# Patient Record
Sex: Female | Born: 1935 | Race: White | Hispanic: No | Marital: Married | Smoking: Never smoker
Health system: Southern US, Community
[De-identification: ages and names within clinical notes are randomized; demographics above are authoritative.]

## PROBLEM LIST (undated history)

## (undated) DIAGNOSIS — Z9109 Other allergy status, other than to drugs and biological substances: Secondary | ICD-10-CM

## (undated) DIAGNOSIS — R112 Nausea with vomiting, unspecified: Secondary | ICD-10-CM

## (undated) DIAGNOSIS — I10 Essential (primary) hypertension: Secondary | ICD-10-CM

## (undated) DIAGNOSIS — M1990 Unspecified osteoarthritis, unspecified site: Secondary | ICD-10-CM

## (undated) DIAGNOSIS — J449 Chronic obstructive pulmonary disease, unspecified: Secondary | ICD-10-CM

## (undated) DIAGNOSIS — M8580 Other specified disorders of bone density and structure, unspecified site: Secondary | ICD-10-CM

## (undated) DIAGNOSIS — R569 Unspecified convulsions: Secondary | ICD-10-CM

## (undated) DIAGNOSIS — M549 Dorsalgia, unspecified: Secondary | ICD-10-CM

## (undated) DIAGNOSIS — Z5189 Encounter for other specified aftercare: Secondary | ICD-10-CM

## (undated) DIAGNOSIS — IMO0001 Reserved for inherently not codable concepts without codable children: Secondary | ICD-10-CM

## (undated) DIAGNOSIS — E079 Disorder of thyroid, unspecified: Secondary | ICD-10-CM

## (undated) DIAGNOSIS — E039 Hypothyroidism, unspecified: Secondary | ICD-10-CM

## (undated) DIAGNOSIS — Z9889 Other specified postprocedural states: Secondary | ICD-10-CM

## (undated) DIAGNOSIS — H3530 Unspecified macular degeneration: Secondary | ICD-10-CM

## (undated) DIAGNOSIS — F0390 Unspecified dementia without behavioral disturbance: Secondary | ICD-10-CM

## (undated) DIAGNOSIS — N390 Urinary tract infection, site not specified: Secondary | ICD-10-CM

## (undated) DIAGNOSIS — E7800 Pure hypercholesterolemia, unspecified: Secondary | ICD-10-CM

## (undated) HISTORY — PX: ROTATOR CUFF REPAIR: SHX139

## (undated) HISTORY — PX: KYPHOSIS SURGERY: SHX114

## (undated) HISTORY — PX: SPINAL FUSION: SHX223

## (undated) HISTORY — PX: BLADDER SURGERY: SHX569

---

## 2006-10-18 ENCOUNTER — Inpatient Hospital Stay (HOSPITAL_COMMUNITY): Admission: EM | Admit: 2006-10-18 | Discharge: 2006-10-23 | Payer: Self-pay | Admitting: Emergency Medicine

## 2006-10-18 ENCOUNTER — Ambulatory Visit: Payer: Self-pay | Admitting: *Deleted

## 2006-11-08 ENCOUNTER — Encounter: Admission: RE | Admit: 2006-11-08 | Discharge: 2006-11-08 | Payer: Self-pay | Admitting: Radiology

## 2006-11-12 ENCOUNTER — Encounter: Admission: RE | Admit: 2006-11-12 | Discharge: 2006-11-12 | Payer: Self-pay | Admitting: Neurosurgery

## 2007-03-31 ENCOUNTER — Encounter: Admission: RE | Admit: 2007-03-31 | Discharge: 2007-03-31 | Payer: Self-pay | Admitting: Radiology

## 2007-04-03 ENCOUNTER — Encounter: Admission: RE | Admit: 2007-04-03 | Discharge: 2007-04-03 | Payer: Self-pay | Admitting: Radiology

## 2007-04-10 ENCOUNTER — Inpatient Hospital Stay (HOSPITAL_COMMUNITY): Admission: RE | Admit: 2007-04-10 | Discharge: 2007-04-24 | Payer: Self-pay | Admitting: Neurological Surgery

## 2010-12-24 ENCOUNTER — Encounter: Payer: Self-pay | Admitting: Radiology

## 2011-04-17 NOTE — Discharge Summary (Signed)
NAMEFERN, Shirley Bond             ACCOUNT NO.:  [id number]   MEDICAL RECORD NO.:  [id number]          PATIENT TYPE:  INP   LOCATION:  3013                         FACILITY:  [facility]   PHYSICIAN:  Stefani Dama, M.D.  DATE OF BIRTH:  08-02-1924   DATE OF ADMISSION:  04/10/2007  DATE OF DISCHARGE:  04/23/2007                               DISCHARGE SUMMARY   ADMITTING DIAGNOSES:  1. Lumbar osteoporotic compression fracture of T12 and L1, with spinal      cord compromise, status post acrylic arthroplasty.  2. Chronic intractable back pain, with opioid dependence.  3. Urinary tract infection.   DIAGNOSES AT TIME OF DISCHARGE:  1. Lumbar osteoporotic compression fracture of T12 and L1, with spinal      cord compromise, status post acrylic arthroplasty.  2. Chronic intractable back pain, with opioid dependence.  3. Urinary tract infection.   FINAL DIAGNOSES:  1. Thoracolumbar compression fractures T12 and L1, with spinal cord      compromise, and chronic intractable pain.  2. Opioid dependence.  3. Urinary tract infection.  4. Altered mental status secondary to above.   CONDITION ON DISCHARGE:  Improving.   HOSPITAL COURSE:  [name] is an 77 year old individual who has  had significant problems with back pain, evidence of a compression  fracture.  She was treated with acrylic balloon kyphoplasty and received  little relief of her pain.  She has had continued difficulties and was  found to have progressive compression with retropulsion of bone into the  canal causing a severe stenosis at the T12-L1 level.  She was advised  regarding surgical decompression which was performed on the day of  admission, Apr 10, 2007.  She had substantial problems with pain  management postoperatively, complaining of severe intractable back pain,  along with altered level of consciousness and altered mental status due  to a significant number of opioids that the patient had become  acclimated  to.  To help with pain management, I obtained a palliative  medicine consult, which adjusted her dose of methadone and opioid  medications.  Gradually with time, her sensorium has cleared, and the  patient clinically is improving steadily.  She has good motor function  in her lower extremities.  The patient is noted to have a urinary tract  infection.  It was originally felt that she is colonized with  Escherichia coli.  Also, on a culture Enterococcus has grown out and a  second species.  Both are sensitive to nitrofurantoin.  At the time of  discharge, the patient is started on nitrofurantoin as Macrobid 100 mg  b.i.d.   ADDITIONAL MEDICATIONS AT THE TIME OF DISCHARGE:  1. Thiamine supplement 100 mg daily.  2. Multivitamin daily.  3. Lisinopril.  4. Zestril 20 mg daily.  5. Levothyroxine 100 mcg p.o. daily.  6. Folic acid 1 mg a day.  7. Metoprolol 25 mg daily.  8. Calcium carbonate with vitamin D 600 mg b.i.d.  9. Remeron 15 mg at bedtime.  10.Miacalcin nasal spray 1 puff daily.  11.Methadone HCl 5 mg daily.  12.Tylenol q.4 h. p.r.n.  pain.  13.Topical Lidoderm patches q.24 h. to area of the back.  14.Lyrica 75 mg p.o. b.i.d.  15.Docusate sodium 100 mg b.i.d.  16.Senokot 1 tablet b.i.d.  17.MiraLax 17 g daily.   CONDITION ON DISCHARGE:  Improving.  The patient should receive physical  therapy to help with gait training and stability on her feet.  Her  incision has been clean and dry, and it is planned to treat the patient  with Macrobid 100 mg b.i.d. for 7 days, the first day being Apr 23, 2007.      Stefani Dama, M.D.  Electronically Signed     HJE/MEDQ  D:  04/23/2007  T:  04/23/2007  Job:  [id number]

## 2011-04-17 NOTE — Consult Note (Signed)
NAME:  Shirley Bond, Shirley Bond             ACCOUNT NO.:  [id number]   MEDICAL RECORD NO.:  [id number]          PATIENT TYPE:  INP   LOCATION:  5114                         FACILITY:  [facility]   PHYSICIAN:  Melissa L. Ladona Ridgel, MD  DATE OF BIRTH:  Mar 22, 1924   DATE OF CONSULTATION:  04/14/2007  DATE OF DISCHARGE:  04/24/2007                                 CONSULTATION   Consulted was called by Dr. Danielle Dess.   REASON FOR CONSULTATION:  Assist with pain control.   CASE SUMMARY:  The patient is an 77 year old white female status post  decompression and fixation of T11 through L2 of her spine. The patient  had a history of back pain, undiagnosed for a period of time, was  finally discovered to be secondary to a vertebral fracture. The patient  had vertebral kyphoplasty but continued to have significant post-  surgical pain. A reevaluation showed extravasation of the acrylic with  bone fragments, compression with further collapse of L1. Palliative  medicine was asked to assist with the pain control for this patient.   REVIEW OF SYSTEMS:  The patient describes lightening-like pain which  comes and goes. The patient is 10/10 and is made worse by movement. It  is located in the right hip area, pelvis area, and is nonradiating.  Further review of systems shows an element of muscle spasm with physical  therapy. She denies nausea and vomiting and is not eating very well.   PAST MEDICAL HISTORY:  1. Hypertension.  2. Hypothyroidism.   PAST SURGICAL HISTORY:  Kyphoplasty.   SOCIAL HISTORY:  She lives with her husband. She is active. Does not  smoke or use alcohol.   FAMILY HISTORY:  Not available to me at this time secondary to the  patient's somnolence.   ALLERGIES:  OXYCODONE WHICH CAUSES HALLUCINATIONS AND AVELOX - REACTION  IS UNKNOWN.   MEDICATIONS:  At the time of the evaluation were:  1. Calcitonin 1 spray alternating nares daily.  2. Calcium carbonate 500 mg t.i.d.  3. Dextrose 5% normal  saline 50 mL an hour.  4. Colace 100 mg p.o. daily.  5. Ensure t.i.d.  6. Folic acid 1 mg p.o. daily.  7. Levothyroxine p.o. daily.  8. Lisinopril 20 mg daily.  9. Methadone 5 mg p.o. q.12h.  10.Lopressor 25 mg b.i.d.  11.Remeron 15 mg every night.  12.Multivitamin 1 tablet daily.  13.Thiamine 100 mg p.o. daily.   VITAL SIGNS:  Temperature was 99.4, blood pressure 145/57, pulse 72,  respirations 18-34, saturations 95%.   Her EKG reveals a sinus bradycardia with a corrective QTC of 443.   LABORATORY DATA:  Sodium 136, potassium 4.5, chloride 103, CO2 28, BUN  14, creatinine 7.4. Her glucose was 163. Her white count was 9.7,  hemoglobin 11.2, hematocrit 33.3 and platelets of 290.   PHYSICAL EXAMINATION:  GENERAL:  The patient was somnolent but will  awakened and converse.  CHEST:  Decreased respirations with very shallow nature.  CARDIOVASCULAR:  Regular rate and rhythm. Positive S1 and S2. No S3/S4.  No murmurs, rubs, or gallops.  ABDOMEN:  Soft, nontender, nondistended with  positive bowel sounds.  EXTREMITIES:  Showed no clubbing, cyanosis, or edema.  NEUROLOGICAL:  She is somewhat confused but orients easily. Power  appears to be 4/5, right greater than left.   ASSESSMENT AND PLAN:  This is an 77 year old white female status post  vertebroplasty secondary to retropulsion of acrylic substance which now  requires decompression at T11 to T12 with lumbar fixation. The patient's  pain appears to be minimally controlled, she does appear to be having  significant respiratory side effects and cognitive effects from her  current medications. I spoke with the patient's daughter, and the  following reflect our goals:   1. Pain control. The patient has a history of detoxification from      benzodiazepines and opiates in an outside hospital with a history      of respiratory arrest secondary to narcotics. She has been      tolerating methadone b.i.d. and Tylenol p.r.n. as an  outpatient.      The patient now, however, has poor pain control with worsening      cognitive status and decreased respiratory status. I would      recommend starting a standing dose of Tylenol every 4 hours. I      would change her methadone to 5 mg daily. I would use p.r.n.      Dilaudid low dose every 4 hours p.r.n. for breakthrough pain with      attention to her pulmonary function. I would add a lidocaine patch      during the day to facilitate physical therapy. I would consider      adding Lyrica in the a.m., as there seems to be some neuropathic      quality to her pain. Her total Dilaudid use in the last 24 hours      was 5 mg. I am wondering if there might be an element of      hyperalgesia, and therefore, I will decrease the dosing.  2. Constipation. Will continue the Colace and up titrate as needed.  3. Will monitor her for hyperalgesia. At this point, I have no other      opioid to rotate with, as she cannot take morphine, oxycodone or      Ultram.  I will check with Dr. Danielle Dess about using Tramadol which      obviously is not ideal since she is postoperative.   Will continue to monitor and assist in the care of this patient.      Melissa L. Ladona Ridgel, MD  Electronically Signed     MLT/MEDQ  D:  05/20/2007  T:  05/20/2007  Job:  [id number]   cc:   Stefani Dama, M.D.  [facility] [place]

## 2011-04-17 NOTE — Op Note (Signed)
NAME:  Shirley Bond, Shirley Bond             ACCOUNT NO.:  [id number]   MEDICAL RECORD NO.:  [id number]          PATIENT TYPE:  OIB   LOCATION:  3307                         FACILITY:  [facility]   PHYSICIAN:  Stefani Dama, M.D.  DATE OF BIRTH:  [birth year]   DATE OF PROCEDURE:  DATE OF DISCHARGE:                               OPERATIVE REPORT   PREOPERATIVE DIAGNOSIS:  Compression fracture T12 and L1 with kyphosis  and spinal cord compromise.   POSTOPERATIVE DIAGNOSIS:  Compression fracture T12 and L1 with kyphosis  and spinal cord compromise.   PROCEDURE:  Decompression of T12 and L1 compression fractures,  decompression of spinal canal at T12 and L1 via posterior corpectomy,  segmental fixation T11-L2 and reconstruction with methacrylate,  posterolateral arthrodesis C11-L2 with local autograft and allograft.   SURGEON:  Dr. Barnett Abu.   FIRST ASSISTANT:  Dr. Delma Officer.   ANESTHESIA:  General endotracheal.   INDICATIONS:  Shirley Bond is a 77 year old individual who  sustained an L1 compression fracture.  She had some mild retropulsed  bone.  She underwent an acrylic balloon kyphoplasty; however, she  developed further progressive compression and she had anterior fracture  of the vertebral body of T12.  She developed significant kyphos with  tight stenosis, had severe pain and was evolving some radiculopathic  change in the proximal lower extremities.  Recent MRI demonstrated  progression of the fracture and collapse of the vertebra.  She is taken  to the operating room now to undergo surgical decompression.   PROCEDURE:  The patient brought to the operating room supine on the  stretcher after smooth induction of general tracheal anesthesia was  turned prone.  The back was prepped with alcohol and DuraPrep and draped  in sterile fashion.  A midline incision was created, carried down to the  thoracolumbar fascia.  Fascia was opened on either side of midline to  the spinous  process of L1, L2, T12, T11.  The dissection was carried out  laterally over the facette joint.  A self-retaining retractor was placed  in the wound.  Both spinous process of L1 along the laminar arches was  taken down to expose common dural tube and the lateral aspect on the  right side of the vertebrae of L1 was exposed. Pediculectomy was  performed to expose the underlying vertebral body.  Here there was found  be a combination of degenerated disk material, necrotic bone and  methacrylate all intermixed near the dorsal surface that was causing  some compression.  This was removed in a piecemeal fashion to perform a  complete corpectomy of the L1 vertebra.  The T12-L1 junction was noted  be severely degenerated and disk material was removed from the space.  The superior anterior portion of the T12 vertebra was also involved with  fracture and there was necrotic bone and disk material and methacrylate  that was found here also.  This was all removed in an effort to  decompress common dural tube.  A similar decompression was then  undertaken from the left side and a pediculectomy again was performed at  L1.  To check the nature the decompression, a solution of Omnipaque-300  mixed 50/50 with saline was placed into the vertebral body defect and  this identified a partial decompression that we had done by removing  partial inferior portion of T12 and of the entire vertebrae of L1. It  was decided at this point that fixation would then be obtained from T11-  L2 and pedicle entry sites were chosen in T11 vertebra and the L2  vertebra.  These were checked fluoroscopically and then they were tapped  with a 5.5 mm tap however, 6.5 mm x 55 mm screws were used in T11 and  also in L2.  When these were placed they were checked radiographically  and found to be good position.  No cutout was noted during the screw  placement.  It was then decided to use  90 mm rod and two 5.5 x 45 mm  screws placed  into the defect that was created by the corpectomy of the  T12-L1 vertebra and I decided to pour methacrylate into the defect and  allow this to harden to maintain the construct on compression.  Once the  decompression was noted be adequate, methacrylate was mixed and it was  allowed to become pliable though still flowable.  It was then poured  into the defect at the T12-L1 vertebra and this was used to fill the  defect approximately halfway.  Localizing radiograph identified good  position of the reconstruction of the corpectomy at T12 and L1 pedicle  screws were then placed on the rod and placed into this construct and  the construct was allowed to harden around the pedicle screws.  The  system was then tightened into the final locking position.  Prior to  doing this the bone was decorticated from T11 down to L2 on both lateral  gutters.  Infuse had been prepared earlier and then thin strips of  infuse were placed along the lateral gutters along with the autograft  that had been harvested earlier from the corpectomy and laminectomy.  A  transverse connector was then applied to the posterior aspect the  construct.  With this, final localizing radiographs in the AP and  lateral injection were obtained with fluoroscopy unit.  The wound was  irrigated copiously with antibiotic irrigating solution.  Small dural  tear that was noted on the left side of the dura had stopped leaking  spinal fluid.  The dura was shredded in this ventral aspect and was not  able to be sewn easily, this was left alone and then the thoracolumbar  dorsal fascia was closed with #1 Vicryl interrupted fashion, 2-0 Vicryl  using subcutaneous tissues, 3-0 Vicryl subcuticularly.  Blood loss  estimated 600 mL.  200 mL of Cell Saver blood was returned to the  patient.      Stefani Dama, M.D.  Electronically Signed     HJE/MEDQ  D:  04/11/2007  T:  04/11/2007  Job:  [id number]

## 2011-04-17 NOTE — H&P (Signed)
NAME:  Shirley Bond, Shirley Bond             ACCOUNT NO.:  [id number]   MEDICAL RECORD NO.:  [id number]          PATIENT TYPE:  OIB   LOCATION:  3307                         FACILITY:  [facility]   PHYSICIAN:  Stefani Dama, M.D.  DATE OF BIRTH:  11/08/1924   DATE OF ADMISSION:  04/10/2007  DATE OF DISCHARGE:                              HISTORY & PHYSICAL   ADMISSION DIAGNOSIS:  T12-L1 compression fracture with spinal canal  compromise, back pain, status post acrylic arthroplasty with further  deterioration.   HISTORY OF PRESENT ILLNESS:  Shirley Bond is an 77 year old  individual who has had significant problems with back pain.  She has  evidence of a compression fracture of the L1 vertebra.  This was  eventually treated with acrylic balloon kyphoplasty.  There was a modest  amount of retropulsed bone.  Over the past couple of months her  condition has deteriorated and she is gotten increasing amounts of pain.  She complains of some dysesthetic sensations in an L1 distribution and a  recent workup included a repeat MRI, which now demonstrates that there  is further collapse of the L1 vertebra in the vertebra plana fashion  with anterior collapse of the T12 vertebra causing a severe kyphosis  with a worsening stenosis across the T12 and L1 levels.  She was  evaluated as an outpatient and because of the severe stenosis that his  present, she has been advised regarding surgical decompression via an  open procedure done posteriorly.   PAST MEDICAL HISTORY:  The patient generally has fair health.  She does  not have any significant cardiac illness.  She is not allergic to any  medications.  She does have some modest hypertension.   SOCIAL HISTORY:  She lives with her husband.  She is quite active,  albeit in the last few months she has not been able to tolerate even  activities of daily living.   PHYSICAL EXAMINATION:  GENERAL:  she is an alert, oriented and  cooperative individual in no  overt distress.  She complains bitterly of  back pain.  She finds that lying flat does give her some relief.  She  cannot sit up for more than 5 minutes at a time.  HEENT:  Her cranial nerves reveal her pupils are 4 mm, briskly reactive  to light and accommodation.  Extraocular movements are full. And the  face has symmetric grimace.  Tongue and uvula in the midline.  Sclerae  and conjunctivae are clear.  NECK:  No masses.  No bruits are heard.  Range of motion is good.  LUNGS:  Clear to auscultation.  HEART:  Regular rate and rhythm.  ABDOMEN:  Soft.  Bowel sounds positive.  No masses are palpable.  EXTREMITIES:  No cyanosis, clubbing or edema.  MUSCULOSKELETAL:  Examination of the back reveals that there is a  palpable kyphosis at the thoracolumbar junction.  This area is tender to  palpation, and percussion is even more tender.  There is no tenderness  to palpation across the lumbosacral junction.  Her motor strength in the  lower extremities reveals that the  iliopsoas, quadriceps, tibialis  anterior and gastrocnemius all have 4/5 strength.  There is some  generalized weakness and atrophy.  Deep tendon reflexes are absent in  the patellae, trace in the Achilles.  Babinski is downgoing.  Sensation  appears intact to pin and vibration somewhat diminished below the levels  of the ankles bilaterally.   IMPRESSION:  The patient has evidence of severe spondylitic degeneration  with a compression fracture that has been treated with acrylic balloon  kyphoplasty and now has a large retropulsed fragment in the canal  causing a severe canal stenosis at the T12-L1 level.  She is now being  admitted to undergo surgical decompression at the T12 and L1 with  fixation T11 to L2.      Stefani Dama, M.D.  Electronically Signed     HJE/MEDQ  D:  04/10/2007  T:  04/11/2007  Job:  [id number]

## 2011-04-20 NOTE — Discharge Summary (Signed)
Shirley Bond, Shirley Bond             ACCOUNT NO.:  [id number]   MEDICAL RECORD NO.:  [id number]          PATIENT TYPE:  INP   LOCATION:  5013                         FACILITY:  [facility]   PHYSICIAN:  Yvonne Kendall, M.D.  DATE OF BIRTH:  08-03-1924   DATE OF ADMISSION:  10/18/2006  DATE OF DISCHARGE:  10/23/2006                                 DISCHARGE SUMMARY   DISCHARGE DIAGNOSES:  1. L1 vertebral compression fracture, status post vertebroplasty.  2. Acute cystitis.  3. Hypertension.  4. Hypothyroidism.  5. Acute delirium - resolved.  6. Mild dementia.  7. Osteoporosis.  8. History of depression.  9. History of anxiety.  10.History of bilateral rotator cuff surgeries.   DISCHARGE MEDICATIONS:  1. Darvocet-N 100 1 tablet p.o. q.4 hours p.r.n. pain.  2. Synthroid 75 mcg p.o. daily.  3. Donepezil 10 mg p.o. daily.  4. Xanax 0.5 mg p.o. q.8 hours.  5. Lexapro 10 mg p.o. daily.  6. Lisinopril 10 mg p.o. daily.  7. Oxybutynin 5 mg p.o. daily.  8. Vytorin 10/20 one tablet p.o. daily.  9. Ranitidine 150 mg p.o. b.i.d.  10.Fosamax 70 mg p.o. weekly.  11.Os-Cal Plus D 500 mg.  12.Calcium carbonate 1 tab p.o. t.i.d.  13.Colace 100 mg p.o. b.i.d.  14.Ciprofloxacin 250 mg p.o. b.i.d. x3 doses.  15.MiraLax p.r.n. for constipation.   DISPOSITION AND FOLLOWUP:  At the time of discharge, the patient's back pain  had decreaed significantly and she was able to ambulate using a rolling  walker without difficulty.  She was alert and oriented to person, place, and  time, and did not exhibit any signs or symptoms consistent with acute  delirium.  The patient has been asked to follow up with her primary care  physician, Dr. Omer Jack, in the next 1-2 weeks.  Shirley Bond also has a  followup appointment with Dr. Alfredo Batty at [facility] on November 08, 2006, at 8:45 a.m. for postoperative evaluation of her vertebroplasty.   PROCEDURES PERFORMED:  L1 vertebroplasty:  This was performed  on October 21, 2006, by Dr. Alfredo Batty.  An L1 compression fracture was previously noted  on thoracic and lumbar MRI performed at an outside hospital.  These images  were reviewed by Dr. Alfredo Batty, who felt that the patient would benefit from  vertebroplasty.  She tolerated this procedure without complications.   CONSULTATIONS:  Dr. Marin Roberts, radiology.   BRIEF ADMITTING HISTORY AND PHYSICAL:  Shirley Bond is an 77 year old  female with a past medical history of hypertension, hypothyroidism,  osteoporosis, and multiple urinary tract infections, who came to the [facility] Emergency Room after experiencing worsening lower back pain.  The  patient and her daughter report that [name] first noticed lower  abdominal and flank pain approximately 6 weeks prior to admission.  Initially, this was thought to be a urinary tract infection or  intraabdominal pathology; however, imaging performed by her primary care  physician suggested a spinal cause.  The patient was treated conservatively  but ultimately required admission to [facility] approximately 1 week  prior to admission because of intractable pain.  An MRI performed during  this hospitalization revealed the L1 compression fracture.  The patient was  discharged home on pain medications and instructed to have a vertebro- or  kyphoplasty performed as an outpatient.  However, 3 days following discharge  from [facility], the patient's pain became unbearable and she came to  [facility] for further management.   PHYSICAL EXAMINATION:  VITAL SIGNS:  Temperature 97.3, blood pressure 91/48,  pulse 60, respirations 18, oxygen saturation 96% on room air.  GENERAL:  The patient is lying in bed comfortably in no acute distress.  EYES:  Pupils are equal, round, and reactive to light and accommodation.  Extraocular eye movements are intact.  NECK:  Supple without lymphadenopathy or thyromegaly.  RESPIRATORY:  Lungs are  clear to auscultation bilaterally without wheezes or  crackles.  CARDIOVASCULAR:  The patient has a regular rate and rhythm without murmurs,  rubs, or gallops.  ABDOMEN:  Normal active bowel sounds are present.  The abdomen is soft,  nontender, and nondistended without hepatosplenomegaly.  EXTREMITIES:  The patient has no lower extremity edema.  Radial, posterior  tibial, and dorsalis pedis are palpated bilaterally and 2+.  MUSCULOSKELETAL:  The patient has 4/5 upper and lower extremity strength  bilaterally.  She has decreased range of motion of her left shoulder  secondary to a distant rotator cuff injury.  NEURO:  Cranial nerves are grossly intact.  Babinski is downgoing  bilaterally.  The patient has minimal weakness, as noted above.  Sensation  is intact.  Cerebellar function is intact.  Examination of the patient's  spine reveals tenderness over the spinous processes beginning in the lower  thoracic area and extending distally to approximately L4.  Mild paraspinal  tenderness is also noted.  PSYCH:  The patient is alert and oriented x3.  Mini-Mental Status  Examination reveals a score of 25-26 out of 30.   ADMISSION LABORATORY DATA:  White blood cell count 8.3, hemoglobin 11.7,  hematocrit 33.8, platelets 424.  Protime 14.6, INR 1.1.  PTT 26.  Sodium  135, potassium 3.1, chloride 101, bicarbonate 27, BUN 25, creatinine 1.0,  glucose 156, total bilirubin 0.4, alkaline phosphatase 64, AST 25, ALT 20,  total protein 5.8, albumin 3.1, calcium 9.1, TSH 1.545.  Urinalysis trace  leukocytes and 3 to 6 white blood cells.  Urine culture revealed only  contaminate organism.   HOSPITAL COURSE:  1. L1 compression fracture:  Based on the study performed at the outside      hospital, the patient was noted to have an L1 vertebral compression      fracture.  The patient's family also reports a history of possible     bulging disk, for which the patient has been treated with epidural       injections in the past; however, her acute pain was felt to be most      likely due to a compression fracture.  She was placed on bedrest and      started on Percocet as needed.  Intranasal calcitonin was also      initiated for pain control.  The patient was evaluated by      interventional radiology and a vertebroplasty was performed, as noted      above.  Following this procedure, the patient had significant      improvement in her pain.  Evaluation by physical therapy and      occupational therapy was performed and the patient was felt to be an  excellent candidate for home rehabilitation.  She is to continue taking      Fosamax as previously prescribed for treatment of osteoporosis, as well      as calcium and vitamin D.  A repeat DEXA scan should be considered by      the patient's primary care physician to evaluate bone density and to      consider additional therapies, as she has now had at least 1      significant compression fracture.  2. Cystitis:  On admission, the patient's urinalysis was largely      unremarkable and a urine culture did not reveal any pathologic      bacteria.  However, as Mrs. Antillon's mental status became altered      during this hospitalization, a repeat urinalysis was obtained and was      strongly suggestive of a urinary tract infection.  She was started on      oral ciprofloxacin and will be treated for a total of 3 days.  She is      to continue the final 1-1/2 days at home following discharge.  Although      a Foley catheter was placed at the time of admission and continued      until shortly before discharge, the patient was able to void without      difficulty and did not complain of any dysuria.  3. Hypertension:  On admission, the patient's blood pressure was found to      be somewhat low at 91/48.  As the patient did not appear to be septic      and has no known cardiomyopathy, it was felt that her hypotension was      likely due to her  antihypertensive medications.  Lisinopril and      diltiazem were held and her blood pressure returned to normal.  Over      the subsequent 2 to 3 days, her blood pressure began trending upwards      and lisinopril was restarted at 10 mg daily.  This provided adequate      control of her blood pressure and she was discharged home on this dose.      Diltiazem was held because the patient did not appear tachycardic and      her blood pressure was adequately controlled with lisinopril.  Further      titration of the patient's antihypertensives will be deferred to her      primary care physician.  4. Acute delirium:  During this hospitalization, the patient had several      episodes of acute delirium, during which time she was not oriented to      person, place, or time, and appeared to have auditory and visual      hallucinations.  It was felt that this was likely a combination of     narcotic use, possible benzodiazepine dependence with early withdrawal,      and a urinary tract infection.  All opiates were held and the patient      was started on a standing dose of Xanax 0.5 mg q.8 hours.  Her urinary      tract infection was also treated.  For the 24 hours prior to discharge,      the patient was lucid and interactive with the staff and her family.      In the future, opiates should be avoided, if at all possible.  5. Dementia:  The patient has a  history of mild dementia and was found to      have a Mini-Mental Status Exam score of 25-26 on admission.  Her      donepezil was continued during this admission.  6. History of depression and anxiety:  The patient does have a history of      depression and anxiety, for which she has been receiving Lexapro and      Xanax.  Lexapro was continued at her home dose.  Xanax was initially      ordered as needed, but the patient did not request any doses.  However,      because she developed what appeared to be an acute delirium possibly      secondary  to benzodiazepine withdrawal, the Xanax was switched to a      standing dose.  She tolerated this well and was instructed to continue      taking Xanax 0.5 mg t.i.d.   DISCHARGE LABORATORY DATA:  White blood cell count 8.3, hemoglobin 11.5,  hematocrit 33.4, platelets 323.  Sodium 137, potassium 3.8, chloride 101,  bicarbonate 31, BUN 15, creatinine 0.8, glucose 107, calcium 9.1.   DISCHARGE VITAL SIGNS:  Temperature 98.3, pulse 88, blood pressure 124/87,  respirations 20, oxygen saturation 97% on room air.      Yvonne Kendall, M.D.  Electronically Signed    CE/MEDQ  D:  10/23/2006  T:  10/24/2006  Job:  [id number]   cc:   Rolly Salter, MD

## 2011-05-09 ENCOUNTER — Ambulatory Visit: Payer: Self-pay | Admitting: Ophthalmology

## 2011-10-31 ENCOUNTER — Other Ambulatory Visit (HOSPITAL_BASED_OUTPATIENT_CLINIC_OR_DEPARTMENT_OTHER): Payer: Self-pay | Admitting: Internal Medicine

## 2011-10-31 DIAGNOSIS — R410 Disorientation, unspecified: Secondary | ICD-10-CM

## 2011-11-02 ENCOUNTER — Ambulatory Visit (HOSPITAL_COMMUNITY)
Admission: RE | Admit: 2011-11-02 | Discharge: 2011-11-02 | Disposition: A | Discharge status: Home / Self Care | Payer: Medicare Other | Source: Ambulatory Visit | Attending: Internal Medicine | Admitting: Internal Medicine

## 2011-11-02 DIAGNOSIS — G319 Degenerative disease of nervous system, unspecified: Secondary | ICD-10-CM | POA: Insufficient documentation

## 2011-11-02 DIAGNOSIS — R410 Disorientation, unspecified: Secondary | ICD-10-CM

## 2011-11-02 DIAGNOSIS — R4182 Altered mental status, unspecified: Secondary | ICD-10-CM | POA: Insufficient documentation

## 2011-11-02 MED ORDER — GADOBENATE DIMEGLUMINE 529 MG/ML IV SOLN
10.0000 mL | Freq: Once | INTRAVENOUS | Status: AC
  Administered 2011-11-02: 10 mL via INTRAVENOUS

## 2012-02-28 ENCOUNTER — Other Ambulatory Visit: Payer: Self-pay

## 2012-02-28 ENCOUNTER — Emergency Department (INDEPENDENT_AMBULATORY_CARE_PROVIDER_SITE_OTHER)
Admission: EM | Admit: 2012-02-28 | Discharge: 2012-02-28 | Disposition: A | Discharge status: Nursing Home (Includes ICF) | Payer: Medicare Other | Source: Home / Self Care | Attending: Emergency Medicine | Admitting: Emergency Medicine

## 2012-02-28 ENCOUNTER — Encounter (HOSPITAL_COMMUNITY): Payer: Self-pay | Admitting: Emergency Medicine

## 2012-02-28 ENCOUNTER — Emergency Department (HOSPITAL_COMMUNITY)
Admission: EM | Admit: 2012-02-28 | Discharge: 2012-02-29 | Disposition: A | Discharge status: Home / Self Care | Payer: Medicare Other | Attending: Emergency Medicine | Admitting: Emergency Medicine

## 2012-02-28 ENCOUNTER — Encounter (HOSPITAL_COMMUNITY): Payer: Self-pay | Admitting: *Deleted

## 2012-02-28 DIAGNOSIS — J449 Chronic obstructive pulmonary disease, unspecified: Secondary | ICD-10-CM | POA: Insufficient documentation

## 2012-02-28 DIAGNOSIS — R531 Weakness: Secondary | ICD-10-CM

## 2012-02-28 DIAGNOSIS — R51 Headache: Secondary | ICD-10-CM

## 2012-02-28 DIAGNOSIS — R81 Glycosuria: Secondary | ICD-10-CM | POA: Insufficient documentation

## 2012-02-28 DIAGNOSIS — R5381 Other malaise: Secondary | ICD-10-CM

## 2012-02-28 DIAGNOSIS — J4489 Other specified chronic obstructive pulmonary disease: Secondary | ICD-10-CM | POA: Insufficient documentation

## 2012-02-28 DIAGNOSIS — R42 Dizziness and giddiness: Secondary | ICD-10-CM | POA: Insufficient documentation

## 2012-02-28 DIAGNOSIS — M129 Arthropathy, unspecified: Secondary | ICD-10-CM | POA: Insufficient documentation

## 2012-02-28 DIAGNOSIS — Z79899 Other long term (current) drug therapy: Secondary | ICD-10-CM | POA: Insufficient documentation

## 2012-02-28 DIAGNOSIS — I10 Essential (primary) hypertension: Secondary | ICD-10-CM | POA: Insufficient documentation

## 2012-02-28 DIAGNOSIS — E7800 Pure hypercholesterolemia, unspecified: Secondary | ICD-10-CM | POA: Insufficient documentation

## 2012-02-28 HISTORY — DX: Unspecified osteoarthritis, unspecified site: M19.90

## 2012-02-28 HISTORY — DX: Dorsalgia, unspecified: M54.9

## 2012-02-28 HISTORY — DX: Chronic obstructive pulmonary disease, unspecified: J44.9

## 2012-02-28 HISTORY — DX: Pure hypercholesterolemia, unspecified: E78.00

## 2012-02-28 HISTORY — DX: Urinary tract infection, site not specified: N39.0

## 2012-02-28 HISTORY — DX: Disorder of thyroid, unspecified: E07.9

## 2012-02-28 HISTORY — DX: Other specified disorders of bone density and structure, unspecified site: M85.80

## 2012-02-28 HISTORY — DX: Essential (primary) hypertension: I10

## 2012-02-28 HISTORY — DX: Unspecified macular degeneration: H35.30

## 2012-02-28 HISTORY — DX: Other allergy status, other than to drugs and biological substances: Z91.09

## 2012-02-28 LAB — CBC
MCV: 93.7 fL (ref 78.0–100.0)
Platelets: 284 10*3/uL (ref 150–400)
Platelets: 303 10*3/uL (ref 150–400)
RBC: 4.11 MIL/uL (ref 3.87–5.11)
RDW: 12.8 % (ref 11.5–15.5)
WBC: 7.4 10*3/uL (ref 4.0–10.5)
WBC: 8.2 10*3/uL (ref 4.0–10.5)

## 2012-02-28 LAB — POCT URINALYSIS DIP (DEVICE)
Bilirubin Urine: NEGATIVE
Hgb urine dipstick: NEGATIVE
Ketones, ur: NEGATIVE mg/dL
Leukocytes, UA: NEGATIVE
Protein, ur: NEGATIVE mg/dL
Specific Gravity, Urine: 1.015 (ref 1.005–1.030)
pH: 5.5 (ref 5.0–8.0)

## 2012-02-28 LAB — DIFFERENTIAL
Basophils Absolute: 0 10*3/uL (ref 0.0–0.1)
Eosinophils Relative: 2 % (ref 0–5)
Eosinophils Relative: 2 % (ref 0–5)
Lymphocytes Relative: 20 % (ref 12–46)
Lymphocytes Relative: 20 % (ref 12–46)
Lymphs Abs: 1.7 10*3/uL (ref 0.7–4.0)
Neutro Abs: 4.9 10*3/uL (ref 1.7–7.7)
Neutro Abs: 5.5 10*3/uL (ref 1.7–7.7)

## 2012-02-28 LAB — POCT I-STAT, CHEM 8
BUN: 33 mg/dL — ABNORMAL HIGH (ref 6–23)
Chloride: 105 mEq/L (ref 96–112)
Creatinine, Ser: 0.9 mg/dL (ref 0.50–1.10)
Potassium: 4.1 mEq/L (ref 3.5–5.1)
Sodium: 140 mEq/L (ref 135–145)
TCO2: 25 mmol/L (ref 0–100)

## 2012-02-28 LAB — BASIC METABOLIC PANEL
CO2: 27 mEq/L (ref 19–32)
Calcium: 10.2 mg/dL (ref 8.4–10.5)
Chloride: 100 mEq/L (ref 96–112)
Sodium: 138 mEq/L (ref 135–145)

## 2012-02-28 NOTE — ED Notes (Signed)
DAUGHTER REPORTS PT. SEEN BY HER UROLOGIST TODAY RESULTS SHOWS GLUCOSE IN URINE - EVALUATE FOR DIABETES.

## 2012-02-28 NOTE — ED Provider Notes (Signed)
History     CSN: [id number]  Arrival date & time 02/28/12  1745   First MD Initiated Contact with Patient 02/28/12 1819      Chief Complaint  Patient presents with  . Blood Sugar Problem  . Headache  . Dizziness    (Consider location/radiation/quality/duration/timing/severity/associated sxs/prior treatment) HPI Comments: Patient reports 3 weeks of daily, worsening headaches, dizziness when bending over or when going from lying to standing, and generalized weakness. Patient was recently treated for a UTI, and states that she finished all the Macrobid. No nausea, vomiting, fevers. No visual changes, dysarthria, leg or arm weakness. No vertigo. Patient denies presyncope, syncope. No palpitations, chest pain, shortness of breath. No recent changes in medication other than the Macrobid. Patient states she is eating and drinking normally.  Patient is a 78 y.o. female presenting with weakness. The history is provided by the patient, a relative and medical records. No language interpreter was used.  Weakness The primary symptoms include headaches and dizziness. Primary symptoms do not include syncope, altered mental status, visual change, paresthesias, memory loss, fever, nausea or vomiting. The symptoms began more than 1 week ago. The symptoms are worsening.  The headache is associated with weakness. The headache is not associated with photophobia, visual change or paresthesias.  Dizziness also occurs with weakness. Dizziness does not occur with tinnitus, nausea or vomiting.  Additional symptoms include weakness. Additional symptoms do not include lower back pain, photophobia, hearing loss or tinnitus. Medical issues also include hypertension. Medical issues do not include cerebral vascular accident or diabetes.    Past Medical History  Diagnosis Date  . COPD (chronic obstructive pulmonary disease)   . Frequent UTI   . Hypertension   . Arthritis   . Osteoarthritis   . Hypercholesteremia     . Asthma   . Thyroid disease   . Environmental allergies   . Osteopenia   . Macular degeneration   . Hearing loss   . Constipation   . Back pain     Past Surgical History  Procedure Date  . Kyphosis surgery   . Spinal fusion   . Rotator cuff repair     unsuccessful x 2    History reviewed. No pertinent family history.  History  Substance Use Topics  . Smoking status: Not on file  . Smokeless tobacco: Not on file  . Alcohol Use: No    OB History    Grav Para Term Preterm Abortions TAB SAB Ect Mult Living                  Review of Systems  Constitutional: Negative for fever.  HENT: Negative for hearing loss and tinnitus.   Eyes: Negative for photophobia.  Cardiovascular: Negative for syncope.  Gastrointestinal: Negative for nausea and vomiting.  Neurological: Positive for dizziness, weakness and headaches. Negative for paresthesias.  Psychiatric/Behavioral: Negative for memory loss and altered mental status.    Allergies  Hyomax; Other; Oxycontin; and Sulfa antibiotics  Home Medications   Current Outpatient Rx  Name Route Sig Dispense Refill  . ACETAMINOPHEN 325 MG PO TABS Oral Take 650 mg by mouth every 6 (six) hours as needed.    . OCUVITE PO TABS Oral Take 1 tablet by mouth 2 (two) times daily.    . BUDESONIDE-FORMOTEROL FUMARATE 160-4.5 MCG/ACT IN AERO Inhalation Inhale 2 puffs into the lungs 2 (two) times daily.    [name] CALTRATE 600 PO Oral Take by mouth 3 (three) times daily.    [name]  CHLORHEXIDINE GLUCONATE 0.12 % MT SOLN Mouth/Throat Use as directed 15 mLs in the mouth or throat at bedtime.    . ESTROGENS, CONJUGATED 0.625 MG/GM VA CREA Vaginal Place 0.5 g vaginally once a week.    [name] FOLIC ACID 1 MG PO TABS Oral Take 1 mg by mouth daily.    [name] HYOSCYAMINE SULFATE 0.125 MG SL SUBL Sublingual Place 0.125 mg under the tongue at bedtime as needed.    . ATROVENT NA Nasal Place into the nose 2 (two) times daily.    [name] LEVOTHYROXINE SODIUM 50 MCG PO TABS Oral Take  50 mcg by mouth daily.    [name] LIDOCAINE 5 % EX PTCH Transdermal Place 1 patch onto the skin daily. Remove & Discard patch within 12 hours or as directed by MD    . LISINOPRIL 20 MG PO TABS Oral Take 20 mg by mouth daily.    . LUBIPROSTONE 24 MCG PO CAPS Oral Take 24 mcg by mouth 2 (two) times daily with a meal.    . MELATONIN 3 MG PO TABS Oral Take 6 mg by mouth at bedtime.    [name] METOPROLOL SUCCINATE ER 50 MG PO TB24 Oral Take 50 mg by mouth daily. Take with or immediately following a meal.    . NITROFURANTOIN MACROCRYSTAL 100 MG PO CAPS Oral Take 100 mg by mouth daily.    [name] BOOST PLUS PO Oral Take by mouth 2 (two) times daily.    . OMEGA 3 PO Oral Take 2,000 mg by mouth 2 (two) times daily.    [name] POLYETHYLENE GLYCOL 3350 PO POWD Oral Take 17 g by mouth daily.    [name] PRAVASTATIN SODIUM 40 MG PO TABS Oral Take 40 mg by mouth daily.    [name] PROBIOTIC PO Oral Take by mouth 2 (two) times daily.    . SENNOSIDES 8.6 MG PO TABS Oral Take 1 tablet by mouth 2 (two) times daily as needed.    [name] VITAMIN B-12 1000 MCG PO TABS Intramuscular Inject 1,000 mcg into the muscle every 30 (thirty) days.      BP 134/84  Pulse 63  Temp(Src) 98.4 F (36.9 C) (Oral)  Resp 18  SpO2 97%  Physical Exam  Nursing note and vitals reviewed. Constitutional: She is oriented to person, place, and time. She appears well-developed and well-nourished. No distress.  HENT:  Head: Normocephalic and atraumatic. No trismus in the jaw.  Nose: Nose normal.  Mouth/Throat: Uvula is midline and oropharynx is clear and moist. Normal dentition.       No frontal, maxillary sinus tenderness  Eyes: Conjunctivae and EOM are normal. Pupils are equal, round, and reactive to light.  Neck: Normal range of motion. Neck supple.  Cardiovascular: Normal rate, regular rhythm, normal heart sounds and intact distal pulses.   Pulmonary/Chest: Effort normal and breath sounds normal.  Abdominal: Soft. Bowel sounds are normal. She exhibits no distension.  There is no tenderness. There is no rebound and no CVA tenderness.  Musculoskeletal: Normal range of motion. She exhibits no tenderness.  Lymphadenopathy:    She has no cervical adenopathy.  Neurological: She is alert and oriented to person, place, and time. No cranial nerve deficit.  Skin: Skin is warm and dry.  Psychiatric:       Speech fluent, responds appropriately to questions. Per daughter patient is at baseline mental status    ED Course  Procedures (including critical care time)  Labs Reviewed  POCT URINALYSIS DIP (DEVICE) - Abnormal; Notable for  the following:    Glucose, UA 100 (*)    All other components within normal limits  GLUCOSE, CAPILLARY   No results found.   1. Weakness   2. Increased frequency of headaches     Results for orders placed during the hospital encounter of 02/28/12  POCT URINALYSIS DIP (DEVICE)      Component Value Range   Glucose, UA 100 (*) NEGATIVE (mg/dL)   Bilirubin Urine NEGATIVE  NEGATIVE    Ketones, ur NEGATIVE  NEGATIVE (mg/dL)   Specific Gravity, Urine 1.015  1.005 - 1.030    Hgb urine dipstick NEGATIVE  NEGATIVE    pH 5.5  5.0 - 8.0    Protein, ur NEGATIVE  NEGATIVE (mg/dL)   Urobilinogen, UA 0.2  0.0 - 1.0 (mg/dL)   Nitrite NEGATIVE  NEGATIVE    Leukocytes, UA NEGATIVE  NEGATIVE   GLUCOSE, CAPILLARY      Component Value Range   Glucose-Capillary 88  70 - 99 (mg/dL)     MDM  Previous records reviewed. Additional medical history obtained. History of HTN, hypothyroidism, back pain, T12 for/L1 compression fracture with spinal canal compromise, status post surgical decompression at T12/L1 and fixation T11-L2  In 2008. No mention of diabetes.  Patient is normotensive here, awake, alert, neurologically intact. Has new onset glucosuria, but blood sugar is normal here. No sinus tenderness. Concern for the worsening daily headaches, dizziness, and increased weakness. Transferring to the ED for further evaluation.   Luiz Blare, MD 02/28/12 2103

## 2012-02-28 NOTE — ED Notes (Addendum)
Per patient and family member: started w/ dizziness spells and HAs approx 3 wks ago; happened to go to urologist for routine check and was found to have UTI and glucose in urine.  Denies any hx diabetes.  Was told dizziness might be R/T possibly being orthostatic.  Had recheck again today at urology office after completing abx - was told urine was clear, but had 3+ glucose in urine per physician notes - was told to be evaluated for diabetes.  Pt denies any c/o dizziness or HA at present time.  States dizziness occurs more when she stands from bending over, but states she feels like she is going to pass out, and it feels different than the vertigo she has had in the past.  Also c/o weakness, increased difficulty with mobility. Pt alert, appropriate in conversation, A & Ox3.

## 2012-02-29 ENCOUNTER — Emergency Department (HOSPITAL_COMMUNITY): Payer: Medicare Other

## 2012-02-29 LAB — URINALYSIS, ROUTINE W REFLEX MICROSCOPIC
Leukocytes, UA: NEGATIVE
Nitrite: NEGATIVE
Specific Gravity, Urine: 1.012 (ref 1.005–1.030)
Urobilinogen, UA: 0.2 mg/dL (ref 0.0–1.0)
pH: 6.5 (ref 5.0–8.0)

## 2012-02-29 LAB — POCT I-STAT TROPONIN I

## 2012-02-29 MED ORDER — AMLODIPINE BESYLATE 5 MG PO TABS
5.0000 mg | ORAL_TABLET | Freq: Once | ORAL | Status: DC
  Filled 2012-02-29: qty 1

## 2012-02-29 NOTE — ED Provider Notes (Addendum)
History     CSN: [id number]  Arrival date & time 02/28/12  2143   First MD Initiated Contact with Patient 02/28/12 2316      Chief Complaint  Patient presents with  . Dizziness    (Consider location/radiation/quality/duration/timing/severity/associated sxs/prior treatment) The history is provided by the patient and a relative. No language interpreter was used.  Sent in by urgent care for occasional lightheadedness without associated headache, nor weakness, nor numbness, nor aphasia, nor chest pain nor shortness of breath.  She has not had any today but did have some three weeks ago when at the urologists office with a UTI.  Was also told she had sugar in her urine and was sent to ED for evaluation of same.  No f/c/r.  No rashes on the skin.  No polyuria no polydipsia.    Past Medical History  Diagnosis Date  . COPD (chronic obstructive pulmonary disease)   . Frequent UTI   . Hypertension   . Arthritis   . Osteoarthritis   . Hypercholesteremia   . Asthma   . Thyroid disease   . Environmental allergies   . Osteopenia   . Macular degeneration   . Hearing loss   . Constipation   . Back pain     Past Surgical History  Procedure Date  . Kyphosis surgery   . Spinal fusion   . Rotator cuff repair     unsuccessful x 2    No family history on file.  History  Substance Use Topics  . Smoking status: Not on file  . Smokeless tobacco: Not on file  . Alcohol Use: No    OB History    Grav Para Term Preterm Abortions TAB SAB Ect Mult Living                  Review of Systems  Constitutional: Negative.  Negative for fever.  HENT: Negative for neck pain.   Eyes: Negative for photophobia and visual disturbance.  Respiratory: Negative for shortness of breath.   Cardiovascular: Negative for chest pain, palpitations and leg swelling.  Gastrointestinal: Negative for abdominal distention.  Genitourinary: Negative for difficulty urinating.  Musculoskeletal: Negative.   Skin:  Negative.   Neurological: Positive for light-headedness.  Hematological: Negative.   Psychiatric/Behavioral: Negative.     Allergies  Hyomax; Other; Oxycontin; and Sulfa antibiotics  Home Medications   Current Outpatient Rx  Name Route Sig Dispense Refill  . ACETAMINOPHEN 325 MG PO TABS Oral Take 650 mg by mouth every 6 (six) hours as needed. For pain    . OCUVITE PO TABS Oral Take 1 tablet by mouth 2 (two) times daily.    . BUDESONIDE-FORMOTEROL FUMARATE 160-4.5 MCG/ACT IN AERO Inhalation Inhale 2 puffs into the lungs 2 (two) times daily.    [name] CALTRATE 600 PO Oral Take by mouth 3 (three) times daily.    . CHLORHEXIDINE GLUCONATE 0.12 % MT SOLN Mouth/Throat Use as directed 15 mLs in the mouth or throat at bedtime.    . ESTROGENS, CONJUGATED 0.625 MG/GM VA CREA Vaginal Place 0.5 g vaginally once a week.    [name] FOLIC ACID 1 MG PO TABS Oral Take 1 mg by mouth daily.    [name] HYOSCYAMINE SULFATE 0.125 MG SL SUBL Sublingual Place 0.125 mg under the tongue at bedtime as needed.    . ATROVENT NA Nasal Place into the nose 2 (two) times daily.    [name] LEVOTHYROXINE SODIUM 50 MCG PO TABS Oral Take 50 mcg by  mouth daily.    [name] LIDOCAINE 5 % EX PTCH Transdermal Place 1 patch onto the skin daily. Remove & Discard patch within 12 hours or as directed by MD    . LISINOPRIL 20 MG PO TABS Oral Take 20 mg by mouth daily.    . LUBIPROSTONE 24 MCG PO CAPS Oral Take 24 mcg by mouth 2 (two) times daily with a meal.    . MELATONIN 3 MG PO TABS Oral Take 6 mg by mouth at bedtime.    [name] METOPROLOL SUCCINATE ER 50 MG PO TB24 Oral Take 50 mg by mouth daily. Take with or immediately following a meal.    . NITROFURANTOIN MACROCRYSTAL 100 MG PO CAPS Oral Take 100 mg by mouth daily.    [name] BOOST PLUS PO Oral Take by mouth 2 (two) times daily.    . OMEGA 3 PO Oral Take 2,000 mg by mouth 2 (two) times daily.    [name] POLYETHYLENE GLYCOL 3350 PO POWD Oral Take 17 g by mouth daily.    [name] PRAVASTATIN SODIUM 40 MG PO TABS Oral Take 40 mg  by mouth daily.    [name] PROBIOTIC PO Oral Take by mouth 2 (two) times daily.    . SENNOSIDES 8.6 MG PO TABS Oral Take 1 tablet by mouth 2 (two) times daily as needed.    [name] VITAMIN B-12 1000 MCG PO TABS Intramuscular Inject 1,000 mcg into the muscle every 30 (thirty) days.      BP 189/76  Pulse 64  Temp(Src) 98.4 F (36.9 C) (Oral)  Resp 20  SpO2 97%  Physical Exam  Constitutional: She is oriented to person, place, and time. She appears well-developed and well-nourished. No distress.  HENT:  Head: Normocephalic and atraumatic.  Right Ear: External ear normal.  Left Ear: External ear normal.  Mouth/Throat: Oropharynx is clear and moist.  Eyes: Conjunctivae and EOM are normal. Pupils are equal, round, and reactive to light.  Neck: No JVD present. No tracheal deviation present.  Cardiovascular: Normal rate and regular rhythm.   Pulmonary/Chest: Effort normal and breath sounds normal. She has no wheezes. She has no rales.  Abdominal: Soft. Bowel sounds are normal. There is no tenderness. There is no rebound and no guarding.  Musculoskeletal: Normal range of motion. She exhibits no edema and no tenderness.  Lymphadenopathy:    She has no cervical adenopathy.  Neurological: She is alert and oriented to person, place, and time. She has normal reflexes. No cranial nerve deficit.       5/5 strength throughout, intact sensation  Skin: Skin is warm and dry. She is not diaphoretic.  Psychiatric: She has a normal mood and affect. Thought content normal.    ED Course  Procedures (including critical care time)  Labs Reviewed  BASIC METABOLIC PANEL - Abnormal; Notable for the following:    BUN 32 (*)    GFR calc non Af Amer 76 (*)    GFR calc Af Amer 89 (*)    All other components within normal limits  POCT I-STAT, CHEM 8 - Abnormal; Notable for the following:    BUN 33 (*)    Glucose, Bld 104 (*)    All other components within normal limits  CBC  DIFFERENTIAL  CBC  DIFFERENTIAL  POCT  I-STAT TROPONIN I  URINALYSIS, ROUTINE W REFLEX MICROSCOPIC  URINE CULTURE   Dg Chest 2 View  02/29/2012  *RADIOLOGY REPORT*  Clinical Data: Dizziness and shortness of breath.  CHEST - 2 VIEW  Comparison: Chest radiograph performed 04/11/2007  Findings: The lungs are well-aerated and clear.  There is no evidence of focal opacification, pleural effusion or pneumothorax.  The heart is borderline normal in size; the mediastinal contour is within normal limits.  No acute osseous abnormalities are seen. There is chronic superior subluxation of both humeral heads, with associated bony resorption of the distal clavicles.  Thoracolumbar spinal fusion hardware is partially imaged.  IMPRESSION: No acute cardiopulmonary process seen.  Original Report Authenticated By: Tonia Ghent, M.D.     No diagnosis found.    MDM   Date: 02/29/2012  Rate: 63  Rhythm: normal sinus rhythm  QRS Axis: normal  Intervals: PR prolonged  ST/T Wave abnormalities: normal  Conduction Disutrbances:first-degree A-V block   Narrative Interpretation:   Old EKG Reviewed: changes noted    Monitor sugar at nursing home.  PCP to obtain HbA1c, adjust blood pressure medications as suspect BP is low in morning when meds are give and then rises in the pm.  All lightheadedness occurs in the mid morning to afternoon. Suspect BP drop with meds and then rises again     Andriea Hasegawa K Ayvion Kavanagh-Rasch, MD 02/29/12 0136  Larita Deremer K Earle Burson-Rasch, MD 02/29/12 [phone]

## 2012-03-01 LAB — URINE CULTURE: Culture  Setup Time: 201303290538

## 2012-04-07 ENCOUNTER — Inpatient Hospital Stay (HOSPITAL_COMMUNITY)
Admission: EM | Admit: 2012-04-07 | Discharge: 2012-04-15 | DRG: 085 | Disposition: A | Discharge status: Skilled Nursing Facility | Payer: Medicare Other | Attending: Internal Medicine | Admitting: Internal Medicine

## 2012-04-07 ENCOUNTER — Encounter (HOSPITAL_COMMUNITY): Payer: Self-pay | Admitting: Cardiology

## 2012-04-07 ENCOUNTER — Emergency Department (HOSPITAL_COMMUNITY): Payer: Medicare Other

## 2012-04-07 DIAGNOSIS — J4489 Other specified chronic obstructive pulmonary disease: Secondary | ICD-10-CM | POA: Diagnosis present

## 2012-04-07 DIAGNOSIS — F0280 Dementia in other diseases classified elsewhere without behavioral disturbance: Secondary | ICD-10-CM | POA: Diagnosis present

## 2012-04-07 DIAGNOSIS — R42 Dizziness and giddiness: Secondary | ICD-10-CM

## 2012-04-07 DIAGNOSIS — I619 Nontraumatic intracerebral hemorrhage, unspecified: Secondary | ICD-10-CM

## 2012-04-07 DIAGNOSIS — S0100XA Unspecified open wound of scalp, initial encounter: Secondary | ICD-10-CM

## 2012-04-07 DIAGNOSIS — I10 Essential (primary) hypertension: Secondary | ICD-10-CM | POA: Diagnosis present

## 2012-04-07 DIAGNOSIS — J449 Chronic obstructive pulmonary disease, unspecified: Secondary | ICD-10-CM | POA: Diagnosis present

## 2012-04-07 DIAGNOSIS — G9340 Encephalopathy, unspecified: Secondary | ICD-10-CM | POA: Diagnosis present

## 2012-04-07 DIAGNOSIS — S0191XA Laceration without foreign body of unspecified part of head, initial encounter: Secondary | ICD-10-CM

## 2012-04-07 DIAGNOSIS — M549 Dorsalgia, unspecified: Secondary | ICD-10-CM | POA: Diagnosis present

## 2012-04-07 DIAGNOSIS — E039 Hypothyroidism, unspecified: Secondary | ICD-10-CM | POA: Diagnosis present

## 2012-04-07 DIAGNOSIS — M1990 Unspecified osteoarthritis, unspecified site: Secondary | ICD-10-CM | POA: Diagnosis present

## 2012-04-07 DIAGNOSIS — S06330A Contusion and laceration of cerebrum, unspecified, without loss of consciousness, initial encounter: Principal | ICD-10-CM | POA: Diagnosis present

## 2012-04-07 DIAGNOSIS — M899 Disorder of bone, unspecified: Secondary | ICD-10-CM | POA: Diagnosis present

## 2012-04-07 DIAGNOSIS — Z981 Arthrodesis status: Secondary | ICD-10-CM

## 2012-04-07 DIAGNOSIS — A499 Bacterial infection, unspecified: Secondary | ICD-10-CM | POA: Diagnosis present

## 2012-04-07 DIAGNOSIS — E876 Hypokalemia: Secondary | ICD-10-CM | POA: Diagnosis present

## 2012-04-07 DIAGNOSIS — F0390 Unspecified dementia without behavioral disturbance: Secondary | ICD-10-CM

## 2012-04-07 DIAGNOSIS — W19XXXA Unspecified fall, initial encounter: Secondary | ICD-10-CM | POA: Diagnosis present

## 2012-04-07 DIAGNOSIS — E119 Type 2 diabetes mellitus without complications: Secondary | ICD-10-CM

## 2012-04-07 DIAGNOSIS — S069X9A Unspecified intracranial injury with loss of consciousness of unspecified duration, initial encounter: Secondary | ICD-10-CM

## 2012-04-07 DIAGNOSIS — S06339A Contusion and laceration of cerebrum, unspecified, with loss of consciousness of unspecified duration, initial encounter: Secondary | ICD-10-CM

## 2012-04-07 DIAGNOSIS — E7800 Pure hypercholesterolemia, unspecified: Secondary | ICD-10-CM | POA: Diagnosis present

## 2012-04-07 DIAGNOSIS — K5900 Constipation, unspecified: Secondary | ICD-10-CM | POA: Diagnosis present

## 2012-04-07 DIAGNOSIS — G894 Chronic pain syndrome: Secondary | ICD-10-CM | POA: Diagnosis present

## 2012-04-07 DIAGNOSIS — N390 Urinary tract infection, site not specified: Secondary | ICD-10-CM | POA: Diagnosis present

## 2012-04-07 HISTORY — DX: Hypothyroidism, unspecified: E03.9

## 2012-04-07 HISTORY — DX: Nausea with vomiting, unspecified: R11.2

## 2012-04-07 HISTORY — DX: Encounter for other specified aftercare: Z51.89

## 2012-04-07 HISTORY — DX: Other specified postprocedural states: Z98.890

## 2012-04-07 HISTORY — DX: Reserved for inherently not codable concepts without codable children: IMO0001

## 2012-04-07 HISTORY — DX: Unspecified dementia, unspecified severity, without behavioral disturbance, psychotic disturbance, mood disturbance, and anxiety: F03.90

## 2012-04-07 LAB — CBC
HCT: 36.6 % (ref 36.0–46.0)
MCV: 92 fL (ref 78.0–100.0)
Platelets: 308 10*3/uL (ref 150–400)
RBC: 3.98 MIL/uL (ref 3.87–5.11)
WBC: 10.5 10*3/uL (ref 4.0–10.5)

## 2012-04-07 LAB — DIFFERENTIAL
Eosinophils Relative: 3 % (ref 0–5)
Lymphocytes Relative: 23 % (ref 12–46)
Lymphs Abs: 2.4 10*3/uL (ref 0.7–4.0)
Monocytes Absolute: 0.9 10*3/uL (ref 0.1–1.0)
Neutro Abs: 6.9 10*3/uL (ref 1.7–7.7)

## 2012-04-07 LAB — BASIC METABOLIC PANEL
CO2: 24 mEq/L (ref 19–32)
Calcium: 9.6 mg/dL (ref 8.4–10.5)
Chloride: 100 mEq/L (ref 96–112)
Glucose, Bld: 153 mg/dL — ABNORMAL HIGH (ref 70–99)
Sodium: 136 mEq/L (ref 135–145)

## 2012-04-07 LAB — GLUCOSE, CAPILLARY
Glucose-Capillary: 96 mg/dL (ref 70–99)
Glucose-Capillary: 123 mg/dL — ABNORMAL HIGH (ref 70–99)

## 2012-04-07 LAB — POCT I-STAT, CHEM 8
BUN: 25 mg/dL — ABNORMAL HIGH (ref 6–23)
Calcium, Ion: 1.24 mmol/L (ref 1.12–1.32)
Chloride: 104 mEq/L (ref 96–112)
HCT: 38 % (ref 36.0–46.0)
Potassium: 3.6 mEq/L (ref 3.5–5.1)

## 2012-04-07 LAB — PROTIME-INR: INR: 0.96 (ref 0.00–1.49)

## 2012-04-07 MED ORDER — KETOTIFEN FUMARATE 0.025 % OP SOLN: 1.0000 [drp] | Freq: Two times a day (BID) | OPHTHALMIC | Status: DC

## 2012-04-07 MED ORDER — ONDANSETRON HCL 4 MG PO TABS: 4.0000 mg | ORAL_TABLET | Freq: Four times a day (QID) | ORAL | Status: DC | PRN

## 2012-04-07 MED ORDER — MIDAZOLAM HCL 5 MG/5ML IJ SOLN: 1.0000 mg | INTRAMUSCULAR | Status: DC | PRN

## 2012-04-07 MED ORDER — MIDAZOLAM HCL 2 MG/2ML IJ SOLN
INTRAMUSCULAR | Status: AC
  Filled 2012-04-07: qty 6

## 2012-04-07 MED ORDER — FENTANYL CITRATE 0.05 MG/ML IJ SOLN
12.5000 ug | Freq: Four times a day (QID) | INTRAMUSCULAR | Status: DC | PRN
  Administered 2012-04-07 – 2012-04-09 (×5): 12.5 ug via INTRAVENOUS
  Filled 2012-04-07 (×5): qty 2

## 2012-04-07 MED ORDER — METHADONE HCL 5 MG PO TABS
2.5000 mg | ORAL_TABLET | Freq: Every day | ORAL | Status: DC
  Administered 2012-04-07: 2.5 mg via ORAL
  Administered 2012-04-09: 09:00:00 via ORAL
  Administered 2012-04-10: 2.5 mg via ORAL
  Filled 2012-04-07 (×4): qty 1

## 2012-04-07 MED ORDER — HYDRALAZINE HCL 20 MG/ML IJ SOLN
10.0000 mg | Freq: Three times a day (TID) | INTRAMUSCULAR | Status: DC | PRN
  Administered 2012-04-12 – 2012-04-13 (×2): 10 mg via INTRAVENOUS
  Filled 2012-04-07 (×3): qty 0.5

## 2012-04-07 MED ORDER — ACETAMINOPHEN 650 MG RE SUPP: 650.0000 mg | Freq: Four times a day (QID) | RECTAL | Status: DC | PRN

## 2012-04-07 MED ORDER — MIDAZOLAM HCL 2 MG/2ML IJ SOLN
1.0000 mg | INTRAMUSCULAR | Status: DC | PRN
  Administered 2012-04-07: 1 mg via INTRAVENOUS

## 2012-04-07 MED ORDER — LIDOCAINE 5 % EX PTCH
1.0000 | MEDICATED_PATCH | Freq: Every day | CUTANEOUS | Status: DC
  Administered 2012-04-07 – 2012-04-15 (×9): 1 via TRANSDERMAL
  Filled 2012-04-07 (×9): qty 1

## 2012-04-07 MED ORDER — OLOPATADINE HCL 0.1 % OP SOLN
1.0000 [drp] | Freq: Two times a day (BID) | OPHTHALMIC | Status: DC
  Administered 2012-04-07 – 2012-04-15 (×14): 1 [drp] via OPHTHALMIC
  Filled 2012-04-07: qty 5

## 2012-04-07 MED ORDER — HALOPERIDOL LACTATE 5 MG/ML IJ SOLN
2.0000 mg | Freq: Four times a day (QID) | INTRAMUSCULAR | Status: DC | PRN
  Administered 2012-04-07 – 2012-04-15 (×10): 2 mg via INTRAVENOUS
  Filled 2012-04-07 (×11): qty 1

## 2012-04-07 MED ORDER — MIDAZOLAM HCL 2 MG/2ML IJ SOLN
INTRAMUSCULAR | Status: AC
  Filled 2012-04-07: qty 2

## 2012-04-07 MED ORDER — ONDANSETRON HCL 4 MG/2ML IJ SOLN: 4.0000 mg | Freq: Four times a day (QID) | INTRAMUSCULAR | Status: DC | PRN

## 2012-04-07 MED ORDER — DEXTROSE-NACL 5-0.9 % IV SOLN
INTRAVENOUS | Status: DC
  Administered 2012-04-08 – 2012-04-09 (×2): via INTRAVENOUS

## 2012-04-07 MED ORDER — BISACODYL 10 MG RE SUPP: 10.0000 mg | Freq: Every day | RECTAL | Status: DC | PRN

## 2012-04-07 MED ORDER — ACETAMINOPHEN 325 MG PO TABS
650.0000 mg | ORAL_TABLET | Freq: Four times a day (QID) | ORAL | Status: DC | PRN
  Administered 2012-04-15: 650 mg via ORAL
  Filled 2012-04-07: qty 2

## 2012-04-07 MED ORDER — MIDAZOLAM HCL 5 MG/5ML IJ SOLN
2.5000 mg | Freq: Once | INTRAMUSCULAR | Status: AC
  Administered 2012-04-07: 2.5 mg via INTRAVENOUS

## 2012-04-07 NOTE — Evaluation (Signed)
Clinical/Bedside Swallow Evaluation Patient Details  Name: Shirley Bond MRN: [id number] Date of Birth: 30-Aug-1924  Today's Date: 04/07/2012 Time: 4098-1191 SLP Time Calculation (min): 22 min  Past Medical History:  Past Medical History  Diagnosis Date  . COPD (chronic obstructive pulmonary disease)   . Frequent UTI   . Hypertension   . Arthritis   . Osteoarthritis   . Hypercholesteremia   . Asthma   . Thyroid disease   . Environmental allergies   . Osteopenia   . Macular degeneration   . Hearing loss   . Constipation   . Back pain   . Dementia   . PONV (postoperative nausea and vomiting)   . Hypothyroidism   . Blood transfusion     " no reaction to transfusion "   Past Surgical History:  Past Surgical History  Procedure Date  . Kyphosis surgery   . Spinal fusion   . Rotator cuff repair     unsuccessful x 2  . Bladder surgery     with mesh & vaginal wall   HPI:  78 y.o. woman who is a resident of [facility] nursing home was brought into the emergency room today after she sustained a fall on her left side and was found to have laceration of her left forehead that was bleeding on arrival. CT scan of the head showed findings suggestive of small focal parenchymal hemorrhage in the left frontal lobe. As per daughter patient has been experiencing dizziness for the last 3-4 weeks and she was seen in the emergency room on March 28 and her antihypertensive regimen was adjusted. As per daughter she was also seen by ENT and no abnormality was found, and her dizziness was attributed to fluctuations on blood pressure with positions. A bedside swallow evaluation order was placed.  Assessment / Plan / Recommendation Clinical Impression  Demonstrates an overall functional pharyngeal swallow with oral phase deficits due to poor cognitive awareness and poor attention.  Patient's greatest aspiration risk is related to her current cognitive state (poor attention, awareness, confused)  following a TBI.  Will recommend initiating a PO diet only when alert and with full supervision by trained staff.  Expect upgraded textures as cognitive abilities improve.    Aspiration Risk  Mild    Diet Recommendation Dysphagia 1 (Puree);Thin liquid   Other  Recommendations Crush Medications, Full supervision at meals   Follow Up Recommendations  Inpatient Rehab    Frequency and Duration min 3x week  2 weeks   Pertinent Vitals/Pain n/a    SLP Swallow Goals Patient will consume recommended diet without observed clinical signs of aspiration with: Supervision/safety Patient will utilize recommended strategies during swallow to increase swallowing safety with: Supervision/safety Patient will consume upgraded solid texture trials with a timely oral phase and no overt indications of aspiration with supervision level cues.   Swallow Study Prior Functional Status  Cognitive/Linguistic Baseline: Information not available Type of Home: Skilled Nursing Facility Available Help at Discharge: Skilled Nursing Facility Vocation: Retired    General Date of Onset: 04/06/12 HPI: 77 y.o. woman who is a resident of [facility] nursing home was brought into the emergency room today after she sustained a fall on her left side and was found to have laceration of her left forehead that was bleeding on arrival. CT scan of the head showed findings suggestive of small focal parenchymal hemorrhage in the left frontal lobe. As per daughter patient has been experiencing dizziness for the last 3-4 weeks and  she was seen in the emergency room on March 28 and her antihypertensive regimen was adjusted. As per daughter she was also seen by ENT and no abnormality was found, and her dizziness was attributed to fluctuations on blood pressure with positions. A bedside swallow evaluation order was placed  Type of Study: Bedside swallow evaluation Diet Prior to this Study: NPO;IV Temperature Spikes Noted: No Respiratory  Status: Room air History of Intubation: No Behavior/Cognition: Confused;Lethargic;Requires cueing;Doesn't follow directions;Decreased sustained attention Oral Cavity - Dentition: Adequate natural dentition Vision: Impaired for self-feeding Patient Positioning: Upright in bed Baseline Vocal Quality: Clear Volitional Cough: Weak Volitional Swallow: Unable to elicit    Oral/Motor/Sensory Function Overall Oral Motor/Sensory Function: Appears within functional limits for tasks assessed   Ice Chips Ice chips: Not tested   Thin Liquid Thin Liquid: Within functional limits Presentation: Cup    Nectar Thick Nectar Thick Liquid: Not tested   Honey Thick Honey Thick Liquid: Not tested   Puree Puree: Within functional limits Presentation: Spoon   Solid Solid: Impaired Oral Phase Impairments: Poor awareness of bolus Oral Phase Functional Implications: Oral holding    Myra Rude, M.S.,CCC-SLP Pager [phone] 04/07/2012,2:19 PM

## 2012-04-07 NOTE — ED Notes (Signed)
Returned from CT. Placed back on monitor at this time. No distress noted.

## 2012-04-07 NOTE — ED Notes (Signed)
Pt from [facility] nursing home- had an unwitnessed fall this morning. Pt has lac to the left forehead that was bleeding on arrival with 4x4's in place. Pt has dementia per baseline, but staff reports that she is not her norm. EMS unable to take vital signs en route. Pt on LSB and c-collar in place. 20g to the left hand.

## 2012-04-07 NOTE — Progress Notes (Signed)
Patient Shirley Bond, 78 year old white female arrived at ED trauma room 17 after falling at the nursing facility where she is a resident.  Her daughter Boyd Kerbs is at her side as she moves from ED trauma room 17 to room 2.  Patient will be admitted to the hospital.  Patient's daughter expressed appreciation for Chaplain's provision of pastoral presence and conversation.  I will follow-up as needed.

## 2012-04-07 NOTE — ED Notes (Signed)
Trauma at the bedside to assess pt. Talking with daughter.

## 2012-04-07 NOTE — ED Notes (Signed)
Transported to CT 

## 2012-04-07 NOTE — H&P (Signed)
PCP:  Terald Sleeper, MD, MD   DOA:  04/07/2012  8:15 AM  Chief Complaint:  Fall  HPI: This is an 78 years old woman who is a resident of [facility] nursing home was brought into the emergency room today after she sustained a fall on her left side and was found to have laceration of her left forehead that was bleeding on arrival. CT scan of the head showed findings suggestive of small focal parenchymal hemorrhage in the left frontal lobe. Patient was seen by trauma surgery who recommended to admit patient to medicine and they will consult with neurosurgery. As per daughter patient has been experiencing dizziness for the last 3-4 weeks and she was seen in the emergency room on March 28 and her antihypertensive regimen was adjusted. As per daughter she was also seen by ENT and no abnormality was found, and her dizziness was attributed to fluctuations on blood pressure with positions. Other than that daughter reported no other symptoms however she stated that her mother is very sensitive to narcotics and methadone was only medicine that works for her.  Allergies: Allergies  Allergen Reactions  . Hyomax (Hyoscyamine Sulfate) Other (See Comments)    unknown  . Other     Negative reactions to narcotics/opiates  . Oxycodone Hcl Er Other (See Comments)    unknown  . Sulfa Antibiotics Other (See Comments)    unknown    Prior to Admission medications   Medication Sig Start Date End Date Taking? Authorizing Provider  acetaminophen (TYLENOL) 325 MG tablet Take 325 mg by mouth every 6 (six) hours as needed. For pain   Yes Historical Provider, MD  beta carotene w/minerals (OCUVITE) tablet Take 1 tablet by mouth 2 (two) times daily.   Yes Historical Provider, MD  budesonide-formoterol (SYMBICORT) 160-4.5 MCG/ACT inhaler Inhale 2 puffs into the lungs 2 (two) times daily.   Yes Historical Provider, MD  Calcium Carbonate (CALTRATE 600 PO) Take by mouth 3 (three) times daily.   Yes Historical  Provider, MD  chlorhexidine (PERIDEX) 0.12 % solution Use as directed 15 mLs in the mouth or throat at bedtime.   Yes Historical Provider, MD  Cobalamine Combinations (VITAMIN B12-FOLIC ACID PO) Take 1 tablet by mouth daily.   Yes Historical Provider, MD  conjugated estrogens (PREMARIN) vaginal cream Place 0.5 g vaginally once a week. On saturday   Yes Historical Provider, MD  DULoxetine (CYMBALTA) 60 MG capsule Take 60 mg by mouth daily.   Yes Historical Provider, MD  hyoscyamine (LEVSIN SL) 0.125 MG SL tablet Place 0.125 mg under the tongue at bedtime as needed.   Yes Historical Provider, MD  Ipratropium Bromide (ATROVENT NA) Place 1 spray into the nose 2 (two) times daily.    Yes Historical Provider, MD  ketotifen (ZADITOR) 0.025 % ophthalmic solution Place 1 drop into both eyes 2 (two) times daily.   Yes Historical Provider, MD  levothyroxine (SYNTHROID, LEVOTHROID) 50 MCG tablet Take 50 mcg by mouth daily.   Yes Historical Provider, MD  lidocaine (LIDODERM) 5 % Place 1 patch onto the skin daily. Remove & Discard patch within 12 hours or as directed by MD   Yes Historical Provider, MD  lisinopril (PRINIVIL,ZESTRIL) 20 MG tablet Take 20 mg by mouth daily.   Yes Historical Provider, MD  lubiprostone (AMITIZA) 24 MCG capsule Take 24 mcg by mouth 2 (two) times daily with a meal.   Yes Historical Provider, MD  meclizine (ANTIVERT) 12.5 MG tablet Take 12.5 mg by mouth  every 8 (eight) hours as needed. For vertigo   Yes Historical Provider, MD  Melatonin 3 MG TABS Take 6 mg by mouth at bedtime.   Yes Historical Provider, MD  methadone (DOLOPHINE) 5 MG tablet Take 2.5 mg by mouth daily. For chronic back pain   Yes Historical Provider, MD  metoprolol succinate (TOPROL-XL) 50 MG 24 hr tablet Take 50 mg by mouth daily. Take with or immediately following a meal.   Yes Historical Provider, MD  naproxen sodium (ANAPROX) 220 MG tablet Take 220 mg by mouth 2 (two) times daily with a meal.   Yes Historical  Provider, MD  Omega-3 Fatty Acids (OMEGA 3 PO) Take 2,000 mg by mouth 2 (two) times daily.   Yes Historical Provider, MD  Polyethyl Glycol-Propyl Glycol (SYSTANE OP) Place 1-2 drops into both eyes 4 (four) times daily.   Yes Historical Provider, MD  polyethylene glycol powder (MIRALAX) powder Take 17 g by mouth daily.   Yes Historical Provider, MD  pravastatin (PRAVACHOL) 40 MG tablet Take 40 mg by mouth daily.   Yes Historical Provider, MD  senna (SENOKOT) 8.6 MG tablet Take 1 tablet by mouth 2 (two) times daily as needed. For constipation   Yes Historical Provider, MD  vitamin B-12 (CYANOCOBALAMIN) 1000 MCG tablet Inject 1,000 mcg into the muscle every 30 (thirty) days. On 15th of each month   Yes Historical Provider, MD    Past Medical History  Diagnosis Date  . COPD (chronic obstructive pulmonary disease)   . Frequent UTI   . Hypertension   . Arthritis   . Osteoarthritis   . Hypercholesteremia   . Asthma   . Thyroid disease   . Environmental allergies   . Osteopenia   . Macular degeneration   . Hearing loss   . Constipation   . Back pain   . Dementia   . PONV (postoperative nausea and vomiting)   . Hypothyroidism   . Blood transfusion     " no reaction to transfusion "    Past Surgical History  Procedure Date  . Kyphosis surgery   . Spinal fusion   . Rotator cuff repair     unsuccessful x 2  . 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 She has never used smokeless tobacco. She reports that she does not drink alcohol or use illicit drugs.  History reviewed. No pertinent family history.  Review of Systems: Unable to obtain    Physical Exam:  Filed Vitals:   04/07/12 0820 04/07/12 0900 04/07/12 0957  BP: 172/84 172/79 145/88  Pulse: 68 52 61  Temp: 97.5 F (36.4 C)    TempSrc: Oral    Resp:  20 15  SpO2: 100% 100% 100%    Constitutional: Vital signs reviewed.   Patient is lethargic but arousable and occasionally agitated.  Head: Left forehead laceration Neck: Supple Cardiovascular: RRR, S1 normal, S2 normal, no MRG, pulses symmetric and intact bilaterally Pulmonary/Chest: CTAB, no wheezes, rales, or rhonchi Abdominal: Soft. Non-tender, non-distended, bowel sounds are normal, no masses, organomegaly, or guarding present.  Ext: no edema and no cyanosis, pulses palpable bilaterally (DP and PT) Neurological: Lethargic, moves all extremities   Labs on Admission:  Results for orders placed during the hospital encounter of 04/07/12 (from the past 48 hour(s))  CBC     Status: Normal   Collection Time   04/07/12  8:20  AM      Component Value Range Comment   WBC 10.5  4.0 - 10.5 (K/uL)    RBC 3.98  3.87 - 5.11 (MIL/uL)    Hemoglobin 12.9  12.0 - 15.0 (g/dL)    HCT 16.1  09.6 - 04.5 (%)    MCV 92.0  78.0 - 100.0 (fL)    MCH 32.4  26.0 - 34.0 (pg)    MCHC 35.2  30.0 - 36.0 (g/dL)    RDW 40.9  81.1 - 91.4 (%)    Platelets 308  150 - 400 (K/uL)   DIFFERENTIAL     Status: Normal   Collection Time   04/07/12  8:20 AM      Component Value Range Comment   Neutrophils Relative 66  43 - 77 (%)    Neutro Abs 6.9  1.7 - 7.7 (K/uL)    Lymphocytes Relative 23  12 - 46 (%)    Lymphs Abs 2.4  0.7 - 4.0 (K/uL)    Monocytes Relative 8  3 - 12 (%)    Monocytes Absolute 0.9  0.1 - 1.0 (K/uL)    Eosinophils Relative 3  0 - 5 (%)    Eosinophils Absolute 0.3  0.0 - 0.7 (K/uL)    Basophils Relative 1  0 - 1 (%)    Basophils Absolute 0.1  0.0 - 0.1 (K/uL)   BASIC METABOLIC PANEL     Status: Abnormal   Collection Time   04/07/12  8:20 AM      Component Value Range Comment   Sodium 136  135 - 145 (mEq/L)    Potassium 3.7  3.5 - 5.1 (mEq/L)    Chloride 100  96 - 112 (mEq/L)    CO2 24  19 - 32 (mEq/L)    Glucose, Bld 153 (*) 70 - 99 (mg/dL)    BUN 24 (*) 6 - 23 (mg/dL)    Creatinine, Ser 7.82  0.50 - 1.10 (mg/dL)    Calcium 9.6  8.4 - 10.5 (mg/dL)    GFR calc non Af  Amer 76 (*) >90 (mL/min)    GFR calc Af Amer 88 (*) >90 (mL/min)   PROTIME-INR     Status: Normal   Collection Time   04/07/12  8:20 AM      Component Value Range Comment   Prothrombin Time 13.0  11.6 - 15.2 (seconds)    INR 0.96  0.00 - 1.49    POCT I-STAT, CHEM 8     Status: Abnormal   Collection Time   04/07/12  8:44 AM      Component Value Range Comment   Sodium 138  135 - 145 (mEq/L)    Potassium 3.6  3.5 - 5.1 (mEq/L)    Chloride 104  96 - 112 (mEq/L)    BUN 25 (*) 6 - 23 (mg/dL)    Creatinine, Ser 9.56  0.50 - 1.10 (mg/dL)    Glucose, Bld 213 (*) 70 - 99 (mg/dL)    Calcium, Ion 0.86  1.12 - 1.32 (mmol/L)    TCO2 25  0 - 100 (mmol/L)    Hemoglobin 12.9  12.0 - 15.0 (g/dL)    HCT 57.8  46.9 - 62.9 (%)     Radiological Exams on Admission: Ct Head Wo Contrast  04/07/2012  *RADIOLOGY REPORT*  Clinical Data: Trauma, fall  CT HEAD WITHOUT CONTRAST,CT CERVICAL SPINE WITHOUT CONTRAST  Technique:  Contiguous axial images were obtained from the base of the skull through the vertex  without contrast.,Technique: Multidetector CT imaging of the cervical spine was performed. Multiplanar CT image reconstructions were also generated.  Comparison: 10/16/2011  Findings: No skull fracture is noted.  The mastoid air cells are unremarkable.  Minimal mucosal thickening ethmoid air cells.  There is soft tissue swelling and skin staples in the left frontal scalp laterally.  In axial image nine there is a 5 mm focus of high attenuation in the left frontal lobe anteriorly adjacent to the midline.  This is highly suspicious for small  focal parenchymal hemorrhage. Clinical correlation is necessary.  Follow-up examination is recommended.  No  mass effect or midline shift.  Mild cerebral atrophy. Atherosclerotic calcifications of carotid siphon are noted.  No acute infarction.  No mass lesion is noted on this unenhanced scan.  Periventricular and patchy subcortical white matter decreased attenuation is probable due to  chronic small vessel ischemic changes.  IMPRESSION:  There is soft tissue swelling subcutaneous stranding and skin staples in the left frontal region.In axial image nine there is a 5 mm focus of high attenuation in the left frontal lobe anteriorly adjacent to the midline.  This is highly suspicious for small focal parenchymal hemorrhage.  Clinical correlation is necessary. Follow-up examination is recommended.  Stable cerebral atrophy.  No acute infarction.  No mass lesion is noted on this unenhanced scan.  CT cervical spine findings:  Axial images of the cervical spine shows no acute fracture. Computer processed images shows no acute fracture.  There is about 4 mm probable chronic anterolisthesis C4 on C5 vertebral body.  No prevertebral soft tissue swelling.  Cervical airway is patent. There is disc space flattening with mild anterior and mild posterior spurring at C5-C6 and C6-C7 level. Multilevel facet degenerative changes are noted.  Impression: 1. No acute fracture. 2.  Probable chronic 4 mm anterolisthesis C4 on C5 vertebral body. Clinical correlation is necessary. 3.  Degenerative changes C5-C6 and C6-C7 level.  No prevertebral soft tissue swelling.  I discussed with Dr. Radford Pax  Original Report Authenticated By: Natasha Mead, M.D.   Ct Cervical Spine Wo Contrast  04/07/2012  *RADIOLOGY REPORT*  Clinical Data: Trauma, fall  CT HEAD WITHOUT CONTRAST,CT CERVICAL SPINE WITHOUT CONTRAST  Technique:  Contiguous axial images were obtained from the base of the skull through the vertex without contrast.,Technique: Multidetector CT imaging of the cervical spine was performed. Multiplanar CT image reconstructions were also generated.  Comparison: 10/16/2011  Findings: No skull fracture is noted.  The mastoid air cells are unremarkable.  Minimal mucosal thickening ethmoid air cells.  There is soft tissue swelling and skin staples in the left frontal scalp laterally.  In axial image nine there is a 5 mm focus of high  attenuation in the left frontal lobe anteriorly adjacent to the midline.  This is highly suspicious for small  focal parenchymal hemorrhage. Clinical correlation is necessary.  Follow-up examination is recommended.  No  mass effect or midline shift.  Mild cerebral atrophy. Atherosclerotic calcifications of carotid siphon are noted.  No acute infarction.  No mass lesion is noted on this unenhanced scan.  Periventricular and patchy subcortical white matter decreased attenuation is probable due to chronic small vessel ischemic changes.  IMPRESSION:  There is soft tissue swelling subcutaneous stranding and skin staples in the left frontal region.In axial image nine there is a 5 mm focus of high attenuation in the left frontal lobe anteriorly adjacent to the midline.  This is highly suspicious for small focal parenchymal hemorrhage.  Clinical correlation  is necessary. Follow-up examination is recommended.  Stable cerebral atrophy.  No acute infarction.  No mass lesion is noted on this unenhanced scan.  CT cervical spine findings:  Axial images of the cervical spine shows no acute fracture. Computer processed images shows no acute fracture.  There is about 4 mm probable chronic anterolisthesis C4 on C5 vertebral body.  No prevertebral soft tissue swelling.  Cervical airway is patent. There is disc space flattening with mild anterior and mild posterior spurring at C5-C6 and C6-C7 level. Multilevel facet degenerative changes are noted.  Impression: 1. No acute fracture. 2.  Probable chronic 4 mm anterolisthesis C4 on C5 vertebral body. Clinical correlation is necessary. 3.  Degenerative changes C5-C6 and C6-C7 level.  No prevertebral soft tissue swelling.  I discussed with Dr. Radford Pax  Original Report Authenticated By: Natasha Mead, M.D.    Assessment/Plan Principal Problem:  * Fall/ Cerebral hemorrhage Active Problems:  Dizziness Hypertension Osteoarthritis Chronic pain syndrome History of COPD History of  hypothyroidism PLAN:  -Admit to telemetry  -Neuro checks every 4 hours .keep n.p.o. for now and ask speech and swallow to evaluate.  -Gently hydrate and check CBGs every 4 hours.  -Safety sitter, fall precautions. -Fentanyl when necessary for pain, Haldol when necessary for agitation. IV hydralazine when necessary for systolic blood pressure more than 150. -Neurosurgical consultation is pending , she will probably need repeat CT scan of the head in the a.m. -Dizziness is probably related to orthostatic hypotension, we'll check vitals for orthostasis when stable. Further workup will be done when patient is more stable.  -CODE STATUS discussed with daughter and wishes to be full code.  -DVT prophylaxis with SCDs     Time Spent on Admission:  approximately 45 minutes  Itha Kroeker 04/07/2012, 10:58 AM

## 2012-04-07 NOTE — Consult Note (Signed)
78 year old woman status post fall. Patient is a resident of a nursing home. No known loss of consciousness.  Patient evaluated in the emergency room for treatment of a scalp laceration. Head CT scan obtained demonstrating a questionable small focus of left frontal cortical hemorrhage 5 mm in diameter possibly felt to represent a contusion. No evidence of fracture. No evidence of mass effect. No evidence of edema.   On exam she is awake and alert she is reasonably appropriate. Cranial nerve function appears intact. Motor examination is 5 over 5 bilaterally. There is no evidence of pronator drift. Remainder of her examination is unremarkable.  Impression: Minor closed head injury with questionable very small cortical contusion. Given the clinical situation I see no evidence of any need for re\re imaging. I think the patient may be safely said and mobilized. Please contact me should she worsen anyway.

## 2012-04-07 NOTE — ED Notes (Signed)
Clinical Social Work Department BRIEF PSYCHOSOCIAL ASSESSMENT 04/07/2012  Patient:  Shirley Bond, Shirley Bond     Account Number:  [id number]     Admit date:  04/07/2012  Clinical Social Worker:  Thomasene Mohair  Date/Time:  04/07/2012 10:45 AM  Referred by:  Physician  Date Referred:  04/07/2012 Referred for  SNF Placement   Other Referral:   Pt is from [facility], hospitalized d/t fall at facility   Interview type:  Family Other interview type:   Pt unable to assess at this time d/t injury and baseline of dementia    PSYCHOSOCIAL DATA Living Status:  FACILITY Admitted from facility:  [facility] Level of care:  Skilled Nursing Facility Primary support name:  Shirley Bond Primary support relationship to patient:  CHILD, ADULT Degree of support available:   HCPOA;  strong support. Pt has a son named Shirley Bond as well that provides limited support.    CURRENT CONCERNS Current Concerns  Adjustment to Illness   Other Concerns:   Pt has dementia at baseline; considered a fall risk at baseline as well.    SOCIAL WORK ASSESSMENT / PLAN CSW was referred to Pt d/t Level II trauma in ED in addition to Pt coming to ED from SNF. CSW met with Pt's daughter Shirley Bond who reports that Pt is from [facility] (since Nov 2012). Pt lives at SNF with her husband who also has dementia. They currently share a room, though there has been discussion about separating the couple d/t increased agitation at times.  Pt's long term plans were being discussed, though it was likely to be SNF level of care d/t to acuity over the last month. CSW will f/u with faciity to confirm plans for Pt to return and contact [facility]. DSS re: concerns with long-term care medicaid in place.   Assessment/plan status:  Psychosocial Support/Ongoing Assessment of Needs Other assessment/ plan:   facilitate return to SNF   Information/referral to community resources:    PATIENT'S/FAMILY'S RESPONSE  TO PLAN OF CARE: Pt's daughter admittedly overwhelmed with insurance and long-term care planning for her mother. CSW offered reassurance for support with navigating the process while Pt is in the hospital. Pt's daughter is appreciative of staff support.  Pt's daughter would like to keep her parents in the same facility as she has rapport with the staff and it is very close to her home.    Frederico Hamman, LCSW [phone]

## 2012-04-07 NOTE — ED Provider Notes (Signed)
History     CSN: [id number]  Arrival date & time 04/07/12  0814   First MD Initiated Contact with Patient 04/07/12 0830      Chief Complaint  Patient presents with  . Illegal value: [    Level 2     HPI Pt from [facility]- had an unwitnessed fall this morning. Pt has lac to the left forehead that was bleeding on arrival with 4x4's in place. Pt has dementia per baseline, but staff reports that she is not her norm. EMS unable to take vital signs en route.  Patient reports story him.  Past Medical History  Diagnosis Date  . COPD (chronic obstructive pulmonary disease)   . Frequent UTI   . Hypertension   . Arthritis   . Osteoarthritis   . Hypercholesteremia   . Asthma   . Thyroid disease   . Environmental allergies   . Osteopenia   . Macular degeneration   . Hearing loss   . Constipation   . Back pain   . Dementia     Past Surgical History  Procedure Date  . Kyphosis surgery   . Spinal fusion   . Rotator cuff repair     unsuccessful x 2    History reviewed. No pertinent family history.  History  Substance Use Topics  . Smoking status: Not on file  . Smokeless tobacco: Not on file  . Alcohol Use: No    OB History    Grav Para Term Preterm Abortions TAB SAB Ect Mult Living                  Review of Systems  Unable to perform ROS: Dementia    Allergies  Hyomax; Other; Oxycodone hcl er; and Sulfa antibiotics  Home Medications   Current Outpatient Rx  Name Route Sig Dispense Refill  . ACETAMINOPHEN 325 MG PO TABS Oral Take 325 mg by mouth every 6 (six) hours as needed. For pain    . OCUVITE PO TABS Oral Take 1 tablet by mouth 2 (two) times daily.    . BUDESONIDE-FORMOTEROL FUMARATE 160-4.5 MCG/ACT IN AERO Inhalation Inhale 2 puffs into the lungs 2 (two) times daily.    [name] CALTRATE 600 PO Oral Take by mouth 3 (three) times daily.    . CHLORHEXIDINE GLUCONATE 0.12 % MT SOLN Mouth/Throat Use as directed 15 mLs in the mouth or throat at  bedtime.    [name] VITAMIN B12-FOLIC ACID PO Oral Take 1 tablet by mouth daily.    [name] ESTROGENS, CONJUGATED 0.625 MG/GM VA CREA Vaginal Place 0.5 g vaginally once a week. On saturday    . DULOXETINE HCL 60 MG PO CPEP Oral Take 60 mg by mouth daily.    [name] HYOSCYAMINE SULFATE 0.125 MG SL SUBL Sublingual Place 0.125 mg under the tongue at bedtime as needed.    . ATROVENT NA Nasal Place 1 spray into the nose 2 (two) times daily.     [name] KETOTIFEN FUMARATE 0.025 % OP SOLN Both Eyes Place 1 drop into both eyes 2 (two) times daily.    [name] LEVOTHYROXINE SODIUM 50 MCG PO TABS Oral Take 50 mcg by mouth daily.    [name] LIDOCAINE 5 % EX PTCH Transdermal Place 1 patch onto the skin daily. Remove & Discard patch within 12 hours or as directed by MD    . LISINOPRIL 20 MG PO TABS Oral Take 20 mg by mouth daily.    . LUBIPROSTONE 24 MCG PO CAPS  Oral Take 24 mcg by mouth 2 (two) times daily with a meal.    . MECLIZINE HCL 12.5 MG PO TABS Oral Take 12.5 mg by mouth every 8 (eight) hours as needed. For vertigo    . MELATONIN 3 MG PO TABS Oral Take 6 mg by mouth at bedtime.    . METHADONE HCL 5 MG PO TABS Oral Take 2.5 mg by mouth daily. For chronic back pain    . METOPROLOL SUCCINATE ER 50 MG PO TB24 Oral Take 50 mg by mouth daily. Take with or immediately following a meal.    . NAPROXEN SODIUM 220 MG PO TABS Oral Take 220 mg by mouth 2 (two) times daily with a meal.    . OMEGA 3 PO Oral Take 2,000 mg by mouth 2 (two) times daily.    Frazier Butt OP Both Eyes Place 1-2 drops into both eyes 4 (four) times daily.    [name] POLYETHYLENE GLYCOL 3350 PO POWD Oral Take 17 g by mouth daily.    [name] PRAVASTATIN SODIUM 40 MG PO TABS Oral Take 40 mg by mouth daily.    . SENNOSIDES 8.6 MG PO TABS Oral Take 1 tablet by mouth 2 (two) times daily as needed. For constipation    . VITAMIN B-12 1000 MCG PO TABS Intramuscular Inject 1,000 mcg into the muscle every 30 (thirty) days. On 15th of each month      BP 172/79  Pulse 52  Temp(Src) 97.5 F (36.4  C) (Oral)  Resp 20  SpO2 100%  Physical Exam  Nursing note and vitals reviewed. Constitutional: She appears well-developed and well-nourished. She is uncooperative. No distress.  HENT:  Head: Normocephalic.    Eyes: Pupils are equal, round, and reactive to light.  Neck: Normal range of motion.  Cardiovascular: Normal rate and intact distal pulses.   Pulmonary/Chest: No respiratory distress.  Abdominal: Normal appearance. She exhibits no distension.  Musculoskeletal: Normal range of motion.  Neurological: She is alert. She has normal strength. She is disoriented. No cranial nerve deficit. GCS eye subscore is 4. GCS verbal subscore is 3. GCS motor subscore is 6.  Skin: Skin is warm and dry. No rash noted.  Psychiatric: She has a normal mood and affect. Her behavior is normal.    ED Course  Procedures (including critical care time)  Labs Reviewed  BASIC METABOLIC PANEL - Abnormal; Notable for the following:    Glucose, Bld 153 (*)    BUN 24 (*)    GFR calc non Af Amer 76 (*)    GFR calc Af Amer 88 (*)    All other components within normal limits  POCT I-STAT, CHEM 8 - Abnormal; Notable for the following:    BUN 25 (*)    Glucose, Bld 156 (*)    All other components within normal limits  CBC  DIFFERENTIAL  PROTIME-INR   Ct Head Wo Contrast  04/07/2012  *RADIOLOGY REPORT*  Clinical Data: Trauma, fall  CT HEAD WITHOUT CONTRAST,CT CERVICAL SPINE WITHOUT CONTRAST  Technique:  Contiguous axial images were obtained from the base of the skull through the vertex without contrast.,Technique: Multidetector CT imaging of the cervical spine was performed. Multiplanar CT image reconstructions were also generated.  Comparison: 10/16/2011  Findings: No skull fracture is noted.  The mastoid air cells are unremarkable.  Minimal mucosal thickening ethmoid air cells.  There is soft tissue swelling and skin staples in the left frontal scalp laterally.  In axial image nine there is a  5 mm focus of  high attenuation in the left frontal lobe anteriorly adjacent to the midline.  This is highly suspicious for small  focal parenchymal hemorrhage. Clinical correlation is necessary.  Follow-up examination is recommended.  No  mass effect or midline shift.  Mild cerebral atrophy. Atherosclerotic calcifications of carotid siphon are noted.  No acute infarction.  No mass lesion is noted on this unenhanced scan.  Periventricular and patchy subcortical white matter decreased attenuation is probable due to chronic small vessel ischemic changes.  IMPRESSION:  There is soft tissue swelling subcutaneous stranding and skin staples in the left frontal region.In axial image nine there is a 5 mm focus of high attenuation in the left frontal lobe anteriorly adjacent to the midline.  This is highly suspicious for small focal parenchymal hemorrhage.  Clinical correlation is necessary. Follow-up examination is recommended.  Stable cerebral atrophy.  No acute infarction.  No mass lesion is noted on this unenhanced scan.  CT cervical spine findings:  Axial images of the cervical spine shows no acute fracture. Computer processed images shows no acute fracture.  There is about 4 mm probable chronic anterolisthesis C4 on C5 vertebral body.  No prevertebral soft tissue swelling.  Cervical airway is patent. There is disc space flattening with mild anterior and mild posterior spurring at C5-C6 and C6-C7 level. Multilevel facet degenerative changes are noted.  Impression: 1. No acute fracture. 2.  Probable chronic 4 mm anterolisthesis C4 on C5 vertebral body. Clinical correlation is necessary. 3.  Degenerative changes C5-C6 and C6-C7 level.  No prevertebral soft tissue swelling.  I discussed with Dr. Radford Pax  Original Report Authenticated By: Natasha Mead, M.D.   Ct Cervical Spine Wo Contrast  04/07/2012  *RADIOLOGY REPORT*  Clinical Data: Trauma, fall  CT HEAD WITHOUT CONTRAST,CT CERVICAL SPINE WITHOUT CONTRAST  Technique:  Contiguous axial  images were obtained from the base of the skull through the vertex without contrast.,Technique: Multidetector CT imaging of the cervical spine was performed. Multiplanar CT image reconstructions were also generated.  Comparison: 10/16/2011  Findings: No skull fracture is noted.  The mastoid air cells are unremarkable.  Minimal mucosal thickening ethmoid air cells.  There is soft tissue swelling and skin staples in the left frontal scalp laterally.  In axial image nine there is a 5 mm focus of high attenuation in the left frontal lobe anteriorly adjacent to the midline.  This is highly suspicious for small  focal parenchymal hemorrhage. Clinical correlation is necessary.  Follow-up examination is recommended.  No  mass effect or midline shift.  Mild cerebral atrophy. Atherosclerotic calcifications of carotid siphon are noted.  No acute infarction.  No mass lesion is noted on this unenhanced scan.  Periventricular and patchy subcortical white matter decreased attenuation is probable due to chronic small vessel ischemic changes.  IMPRESSION:  There is soft tissue swelling subcutaneous stranding and skin staples in the left frontal region.In axial image nine there is a 5 mm focus of high attenuation in the left frontal lobe anteriorly adjacent to the midline.  This is highly suspicious for small focal parenchymal hemorrhage.  Clinical correlation is necessary. Follow-up examination is recommended.  Stable cerebral atrophy.  No acute infarction.  No mass lesion is noted on this unenhanced scan.  CT cervical spine findings:  Axial images of the cervical spine shows no acute fracture. Computer processed images shows no acute fracture.  There is about 4 mm probable chronic anterolisthesis C4 on C5 vertebral body.  No prevertebral soft  tissue swelling.  Cervical airway is patent. There is disc space flattening with mild anterior and mild posterior spurring at C5-C6 and C6-C7 level. Multilevel facet degenerative changes are  noted.  Impression: 1. No acute fracture. 2.  Probable chronic 4 mm anterolisthesis C4 on C5 vertebral body. Clinical correlation is necessary. 3.  Degenerative changes C5-C6 and C6-C7 level.  No prevertebral soft tissue swelling.  I discussed with Dr. Radford Pax  Original Report Authenticated By: Natasha Mead, M.D.     1. Dizziness   2. Fall   3. Frontal lobe contusion   4. Laceration of head       MDM   Neurosurgery service and trauma service was contacted.       Nelia Shi, MD 04/09/12 0800

## 2012-04-07 NOTE — Consult Note (Signed)
Reason for Consult:Closed head injury after ground level fall Referring Physician: EDP/Beaton  Shirley Bond is an 78 y.o. female.  HPI: Patient of [facility], found down with bleeding scalp laceration.  Brought to [facility] arousable and disoriented, history of chronic pain and dementia.  History of chronic dizziness  Past Medical History  Diagnosis Date  . COPD (chronic obstructive pulmonary disease)   . Frequent UTI   . Hypertension   . Arthritis   . Osteoarthritis   . Hypercholesteremia   . Asthma   . Thyroid disease   . Environmental allergies   . Osteopenia   . Macular degeneration   . Hearing loss   . Constipation   . Back pain   . Dementia     Past Surgical History  Procedure Date  . Kyphosis surgery   . Spinal fusion   . Rotator cuff repair     unsuccessful x 2    History reviewed. No pertinent family history.  Social History:  does not have a smoking history on file. She does not have any smokeless tobacco history on file. She reports that she does not drink alcohol or use illicit drugs.  Allergies:  Allergies  Allergen Reactions  . Hyomax (Hyoscyamine Sulfate) Other (See Comments)    unknown  . Other     Negative reactions to narcotics/opiates  . Oxycodone Hcl Er Other (See Comments)    unknown  . Sulfa Antibiotics Other (See Comments)    unknown    Medications: I have reviewed the patient's current medications.  Results for orders placed during the hospital encounter of 04/07/12 (from the past 48 hour(s))  CBC     Status: Normal   Collection Time   04/07/12  8:20 AM      Component Value Range Comment   WBC 10.5  4.0 - 10.5 (K/uL)    RBC 3.98  3.87 - 5.11 (MIL/uL)    Hemoglobin 12.9  12.0 - 15.0 (g/dL)    HCT 16.1  09.6 - 04.5 (%)    MCV 92.0  78.0 - 100.0 (fL)    MCH 32.4  26.0 - 34.0 (pg)    MCHC 35.2  30.0 - 36.0 (g/dL)    RDW 40.9  81.1 - 91.4 (%)    Platelets 308  150 - 400 (K/uL)   DIFFERENTIAL     Status: Normal   Collection Time     04/07/12  8:20 AM      Component Value Range Comment   Neutrophils Relative 66  43 - 77 (%)    Neutro Abs 6.9  1.7 - 7.7 (K/uL)    Lymphocytes Relative 23  12 - 46 (%)    Lymphs Abs 2.4  0.7 - 4.0 (K/uL)    Monocytes Relative 8  3 - 12 (%)    Monocytes Absolute 0.9  0.1 - 1.0 (K/uL)    Eosinophils Relative 3  0 - 5 (%)    Eosinophils Absolute 0.3  0.0 - 0.7 (K/uL)    Basophils Relative 1  0 - 1 (%)    Basophils Absolute 0.1  0.0 - 0.1 (K/uL)   BASIC METABOLIC PANEL     Status: Abnormal   Collection Time   04/07/12  8:20 AM      Component Value Range Comment   Sodium 136  135 - 145 (mEq/L)    Potassium 3.7  3.5 - 5.1 (mEq/L)    Chloride 100  96 - 112 (mEq/L)    CO2  24  19 - 32 (mEq/L)    Glucose, Bld 153 (*) 70 - 99 (mg/dL)    BUN 24 (*) 6 - 23 (mg/dL)    Creatinine, Ser 2.95  0.50 - 1.10 (mg/dL)    Calcium 9.6  8.4 - 10.5 (mg/dL)    GFR calc non Af Amer 76 (*) >90 (mL/min)    GFR calc Af Amer 88 (*) >90 (mL/min)   PROTIME-INR     Status: Normal   Collection Time   04/07/12  8:20 AM      Component Value Range Comment   Prothrombin Time 13.0  11.6 - 15.2 (seconds)    INR 0.96  0.00 - 1.49    POCT I-STAT, CHEM 8     Status: Abnormal   Collection Time   04/07/12  8:44 AM      Component Value Range Comment   Sodium 138  135 - 145 (mEq/L)    Potassium 3.6  3.5 - 5.1 (mEq/L)    Chloride 104  96 - 112 (mEq/L)    BUN 25 (*) 6 - 23 (mg/dL)    Creatinine, Ser 6.21  0.50 - 1.10 (mg/dL)    Glucose, Bld 308 (*) 70 - 99 (mg/dL)    Calcium, Ion 6.57  1.12 - 1.32 (mmol/L)    TCO2 25  0 - 100 (mmol/L)    Hemoglobin 12.9  12.0 - 15.0 (g/dL)    HCT 84.6  96.2 - 95.2 (%)     Ct Head Wo Contrast  04/07/2012  *RADIOLOGY REPORT*  Clinical Data: Trauma, fall  CT HEAD WITHOUT CONTRAST,CT CERVICAL SPINE WITHOUT CONTRAST  Technique:  Contiguous axial images were obtained from the base of the skull through the vertex without contrast.,Technique: Multidetector CT imaging of the cervical spine was  performed. Multiplanar CT image reconstructions were also generated.  Comparison: 10/16/2011  Findings: No skull fracture is noted.  The mastoid air cells are unremarkable.  Minimal mucosal thickening ethmoid air cells.  There is soft tissue swelling and skin staples in the left frontal scalp laterally.  In axial image nine there is a 5 mm focus of high attenuation in the left frontal lobe anteriorly adjacent to the midline.  This is highly suspicious for small  focal parenchymal hemorrhage. Clinical correlation is necessary.  Follow-up examination is recommended.  No  mass effect or midline shift.  Mild cerebral atrophy. Atherosclerotic calcifications of carotid siphon are noted.  No acute infarction.  No mass lesion is noted on this unenhanced scan.  Periventricular and patchy subcortical white matter decreased attenuation is probable due to chronic small vessel ischemic changes.  IMPRESSION:  There is soft tissue swelling subcutaneous stranding and skin staples in the left frontal region.In axial image nine there is a 5 mm focus of high attenuation in the left frontal lobe anteriorly adjacent to the midline.  This is highly suspicious for small focal parenchymal hemorrhage.  Clinical correlation is necessary. Follow-up examination is recommended.  Stable cerebral atrophy.  No acute infarction.  No mass lesion is noted on this unenhanced scan.  CT cervical spine findings:  Axial images of the cervical spine shows no acute fracture. Computer processed images shows no acute fracture.  There is about 4 mm probable chronic anterolisthesis C4 on C5 vertebral body.  No prevertebral soft tissue swelling.  Cervical airway is patent. There is disc space flattening with mild anterior and mild posterior spurring at C5-C6 and C6-C7 level. Multilevel facet degenerative changes are noted.  Impression: 1.  No acute fracture. 2.  Probable chronic 4 mm anterolisthesis C4 on C5 vertebral body. Clinical correlation is necessary. 3.   Degenerative changes C5-C6 and C6-C7 level.  No prevertebral soft tissue swelling.  I discussed with Dr. Radford Pax  Original Report Authenticated By: Natasha Mead, M.D.   Ct Cervical Spine Wo Contrast  04/07/2012  *RADIOLOGY REPORT*  Clinical Data: Trauma, fall  CT HEAD WITHOUT CONTRAST,CT CERVICAL SPINE WITHOUT CONTRAST  Technique:  Contiguous axial images were obtained from the base of the skull through the vertex without contrast.,Technique: Multidetector CT imaging of the cervical spine was performed. Multiplanar CT image reconstructions were also generated.  Comparison: 10/16/2011  Findings: No skull fracture is noted.  The mastoid air cells are unremarkable.  Minimal mucosal thickening ethmoid air cells.  There is soft tissue swelling and skin staples in the left frontal scalp laterally.  In axial image nine there is a 5 mm focus of high attenuation in the left frontal lobe anteriorly adjacent to the midline.  This is highly suspicious for small  focal parenchymal hemorrhage. Clinical correlation is necessary.  Follow-up examination is recommended.  No  mass effect or midline shift.  Mild cerebral atrophy. Atherosclerotic calcifications of carotid siphon are noted.  No acute infarction.  No mass lesion is noted on this unenhanced scan.  Periventricular and patchy subcortical white matter decreased attenuation is probable due to chronic small vessel ischemic changes.  IMPRESSION:  There is soft tissue swelling subcutaneous stranding and skin staples in the left frontal region.In axial image nine there is a 5 mm focus of high attenuation in the left frontal lobe anteriorly adjacent to the midline.  This is highly suspicious for small focal parenchymal hemorrhage.  Clinical correlation is necessary. Follow-up examination is recommended.  Stable cerebral atrophy.  No acute infarction.  No mass lesion is noted on this unenhanced scan.  CT cervical spine findings:  Axial images of the cervical spine shows no acute  fracture. Computer processed images shows no acute fracture.  There is about 4 mm probable chronic anterolisthesis C4 on C5 vertebral body.  No prevertebral soft tissue swelling.  Cervical airway is patent. There is disc space flattening with mild anterior and mild posterior spurring at C5-C6 and C6-C7 level. Multilevel facet degenerative changes are noted.  Impression: 1. No acute fracture. 2.  Probable chronic 4 mm anterolisthesis C4 on C5 vertebral body. Clinical correlation is necessary. 3.  Degenerative changes C5-C6 and C6-C7 level.  No prevertebral soft tissue swelling.  I discussed with Dr. Radford Pax  Original Report Authenticated By: Natasha Mead, M.D.    Review of Systems  Constitutional: Positive for weight loss.  HENT: Negative.   Eyes: Negative.   Respiratory: Negative.   Cardiovascular: Negative.   Gastrointestinal: Negative.   Genitourinary: Positive for urgency (incontinence).  Skin: Positive for rash (bruising).  Neurological: Positive for dizziness.  Endo/Heme/Allergies: Negative.   Psychiatric/Behavioral: Positive for depression.   Blood pressure 172/79, pulse 52, temperature 97.5 F (36.4 C), temperature source Oral, resp. rate 20, SpO2 100.00%. Physical Exam  Constitutional: She appears well-developed. She appears lethargic.  HENT:  Head:    Eyes: EOM are normal. Pupils are equal, round, and reactive to light.  Neck: Normal range of motion. Neck supple.  Cardiovascular: Normal rate, regular rhythm and normal heart sounds.   Respiratory: Effort normal and breath sounds normal.  GI: Soft.  Musculoskeletal: Normal range of motion.  Neurological: She appears lethargic. She exhibits abnormal muscle tone. GCS eye subscore is 1.  GCS verbal subscore is 3. GCS motor subscore is 6.  Reflex Scores:      Tricep reflexes are 2+ on the right side and 2+ on the left side. Skin: Skin is warm and dry.    Assessment/Plan: Mild intra-cranial bleed after ground level fall in  previously demented patient.  History of chronic pain in back.  Admit to medicine.  We will consult along with neurosurgery.  Needs repeat CT scan in the AM.  Dovber Ernest III,Malka Bocek O 04/07/2012, 9:48 AM

## 2012-04-07 NOTE — Evaluation (Signed)
Speech Language Pathology Evaluation Patient Details Name: Shirley Bond MRN: [id number] DOB: June 15, 1924 Today's Date: 04/07/2012 Time: 1350-1405 SLP Time Calculation (min): 15 min  Problem List:  Patient Active Problem List  Diagnoses  . Fall  . Cerebral hemorrhage  . Dizziness  . HTN (hypertension)  . Osteoarthritis  . COPD (chronic obstructive pulmonary disease)   Past Medical History:  Past Medical History  Diagnosis Date  . COPD (chronic obstructive pulmonary disease)   . Frequent UTI   . Hypertension   . Arthritis   . Osteoarthritis   . Hypercholesteremia   . Asthma   . Thyroid disease   . Environmental allergies   . Osteopenia   . Macular degeneration   . Hearing loss   . Constipation   . Back pain   . Dementia   . PONV (postoperative nausea and vomiting)   . Hypothyroidism   . Blood transfusion     " no reaction to transfusion "   Past Surgical History:  Past Surgical History  Procedure Date  . Kyphosis surgery   . Spinal fusion   . Rotator cuff repair     unsuccessful x 2  . Bladder surgery     with mesh & vaginal wall    Assessment / Plan / Recommendation Clinical Impression  Demonstrates a severe cognitive-linguistic impairment in all cognitive hierarchial areas, particularly in attention (focused level), orientation and basic processing/working memory.  Patient's resltess behaviors and overall status appears consistent with a Rancho TBI Level IV (confused, agitated).  Treatment will focus on maximizing her abilities to progress to level V (Confused, inappropriate).    SLP Assessment  Patient needs continued Speech Lanaguage Pathology Services    Follow Up Recommendations  Inpatient Rehab    Frequency and Duration min 3x week  2 weeks   Pertinent Vitals/Pain n/a   SLP Goals  SLP Goals Potential to Achieve Goals: Good Potential Considerations: Medical prognosis Progress/Goals/Alternative treatment plan discussed with pt/caregiver and  they: No caregivers available SLP Goal #1: Patient will sustain attention for 10-15 seconds in familiar, functional ADL tasks with maximum contextual/verbal cues. SLP Goal #2: Patient will solve basic problems related to familiar ADLs with maximum verbal/contextual cues. SLP Goal #3: Patient will follow basic 1 step commands in basic, familiar tasks with maximum contextual/verbal cues.  SLP Evaluation Prior Functioning  Cognitive/Linguistic Baseline: Information not available Type of Home: Skilled Nursing Facility Available Help at Discharge: Skilled Nursing Facility Vocation: Retired   IT consultant  Overall Cognitive Status: Impaired Arousal/Alertness: Lethargic Orientation Level: Disoriented X4 Attention: Focused Memory: Impaired Awareness: Impaired Awareness Impairment: Intellectual impairment Problem Solving: Impaired Problem Solving Impairment: Verbal basic;Functional basic Behaviors: Restless;Perseveration Safety/Judgment: Appears intact [place] [employer]ant Scales of Cognitive Functioning: Confused/agitated    Comprehension  Auditory Comprehension Overall Auditory Comprehension: Impaired Yes/No Questions: Impaired Basic Biographical Questions: 26-50% accurate Commands: Impaired One Step Basic Commands: 0-24% accurate Interfering Components: Attention EffectiveTechniques:  (Cold cloth on face; noxious stimuli) Visual Recognition/Discrimination Discrimination: Exceptions to [facility] Common Objects: Able in field of 2 Reading Comprehension Reading Status: Not tested    Expression Expression Primary Mode of Expression: Verbal Verbal Expression Overall Verbal Expression: Impaired Initiation: No impairment Automatic Speech: Name Level of Generative/Spontaneous Verbalization: Word;Phrase Repetition: Impaired Level of Impairment: Word level Naming: Impairment Responsive: 0-25% accurate Confrontation: Impaired Common Objects: Able in field of 2 Convergent: 0-24%  accurate Divergent: 0-24% accurate Verbal Errors: Neologisms;Confabulation;Language of confusion;Not aware of errors Pragmatics: Impairment Written Expression Dominant Hand: Right Written Expression: Not tested  Oral / Motor Oral Motor/Sensory Function Overall Oral Motor/Sensory Function: Appears within functional limits for tasks assessed Motor Speech Overall Motor Speech: Appears within functional limits for tasks assessed     Myra Rude, M.S.,CCC-SLP Pager [phone] 04/07/2012, 2:29 PM

## 2012-04-08 DIAGNOSIS — I619 Nontraumatic intracerebral hemorrhage, unspecified: Secondary | ICD-10-CM

## 2012-04-08 DIAGNOSIS — E119 Type 2 diabetes mellitus without complications: Secondary | ICD-10-CM

## 2012-04-08 DIAGNOSIS — R42 Dizziness and giddiness: Secondary | ICD-10-CM

## 2012-04-08 DIAGNOSIS — F0390 Unspecified dementia without behavioral disturbance: Secondary | ICD-10-CM

## 2012-04-08 LAB — CBC
HCT: 29.2 % — ABNORMAL LOW (ref 36.0–46.0)
Hemoglobin: 10.2 g/dL — ABNORMAL LOW (ref 12.0–15.0)
RBC: 3.2 MIL/uL — ABNORMAL LOW (ref 3.87–5.11)
WBC: 7.2 10*3/uL (ref 4.0–10.5)

## 2012-04-08 LAB — GLUCOSE, CAPILLARY

## 2012-04-08 LAB — BASIC METABOLIC PANEL
Chloride: 104 mEq/L (ref 96–112)
GFR calc Af Amer: 90 mL/min — ABNORMAL LOW (ref >90)
Potassium: 3.1 mEq/L — ABNORMAL LOW (ref 3.5–5.1)

## 2012-04-08 MED ORDER — DULOXETINE HCL 60 MG PO CPEP
60.0000 mg | ORAL_CAPSULE | Freq: Every day | ORAL | Status: DC
  Administered 2012-04-09 – 2012-04-15 (×7): 60 mg via ORAL
  Filled 2012-04-08 (×8): qty 1

## 2012-04-08 MED ORDER — METOPROLOL SUCCINATE ER 50 MG PO TB24
50.0000 mg | ORAL_TABLET | Freq: Every day | ORAL | Status: DC
  Filled 2012-04-08: qty 1

## 2012-04-08 MED ORDER — SIMVASTATIN 20 MG PO TABS
20.0000 mg | ORAL_TABLET | Freq: Every day | ORAL | Status: DC
  Administered 2012-04-08 – 2012-04-15 (×8): 20 mg via ORAL
  Filled 2012-04-08 (×8): qty 1

## 2012-04-08 MED ORDER — METOPROLOL SUCCINATE ER 50 MG PO TB24
50.0000 mg | ORAL_TABLET | Freq: Every day | ORAL | Status: DC
  Administered 2012-04-09 – 2012-04-15 (×7): 50 mg via ORAL
  Filled 2012-04-08 (×8): qty 1

## 2012-04-08 MED ORDER — MECLIZINE HCL 12.5 MG PO TABS
12.5000 mg | ORAL_TABLET | Freq: Three times a day (TID) | ORAL | Status: DC | PRN
  Administered 2012-04-08: 12.5 mg via ORAL
  Filled 2012-04-08 (×3): qty 1

## 2012-04-08 MED ORDER — SENNA 8.6 MG PO TABS
1.0000 | ORAL_TABLET | Freq: Two times a day (BID) | ORAL | Status: DC | PRN
  Filled 2012-04-08: qty 1

## 2012-04-08 MED ORDER — ACETAMINOPHEN 325 MG PO TABS: 325.0000 mg | ORAL_TABLET | Freq: Four times a day (QID) | ORAL | Status: DC | PRN

## 2012-04-08 MED ORDER — LUBIPROSTONE 24 MCG PO CAPS
24.0000 ug | ORAL_CAPSULE | Freq: Two times a day (BID) | ORAL | Status: DC
  Administered 2012-04-08 – 2012-04-15 (×14): 24 ug via ORAL
  Filled 2012-04-08 (×18): qty 1

## 2012-04-08 MED ORDER — POTASSIUM CHLORIDE 10 MEQ/100ML IV SOLN
10.0000 meq | INTRAVENOUS | Status: AC
  Administered 2012-04-08 (×4): 10 meq via INTRAVENOUS
  Filled 2012-04-08 (×4): qty 100

## 2012-04-08 MED ORDER — LEVOTHYROXINE SODIUM 50 MCG PO TABS
50.0000 ug | ORAL_TABLET | Freq: Every day | ORAL | Status: DC
  Administered 2012-04-09 – 2012-04-15 (×7): 50 ug via ORAL
  Filled 2012-04-08 (×10): qty 1

## 2012-04-08 MED ORDER — BUDESONIDE-FORMOTEROL FUMARATE 160-4.5 MCG/ACT IN AERO
2.0000 | INHALATION_SPRAY | Freq: Two times a day (BID) | RESPIRATORY_TRACT | Status: DC
  Administered 2012-04-08 – 2012-04-15 (×11): 2 via RESPIRATORY_TRACT
  Filled 2012-04-08: qty 6

## 2012-04-08 NOTE — Progress Notes (Signed)
Clinical Social Work-Covering SW contacted pt dtr who confirmed that pt from [facility] and will return-pt dtr requested CSW contact [place] [facility] to ensure that pt is to transition to LTC-CSW left message for [employer] worker and has provided report for unit based SW S. McNulty who will be following as of 04/09/12- [employer], [phone]

## 2012-04-08 NOTE — Progress Notes (Signed)
Pt was pulling at IV. RN assessed IV site, tape had been pulled up and pt had large skin tear to left hand. This RN then D/C the IV and put a dressing over the site. MD was notified.

## 2012-04-08 NOTE — Progress Notes (Signed)
   CARE MANAGEMENT NOTE 04/08/2012  Patient:  Shirley Bond, Shirley Bond   Account Number:  [id number]  Date Initiated:  04/08/2012  Documentation initiated by:  Darlyne Russian  Subjective/Objective Assessment:   Patient admitted with cerebral hemorrhage s/p fall.     Action/Plan:   Progression of care and discharge planning   Anticipated DC Date:  04/11/2012   Anticipated DC Plan:  SKILLED NURSING FACILITY  In-house referral  Clinical Social Worker      DC Planning Services  CM consult      Choice offered to / List presented to:             Status of service:  In process, will continue to follow Medicare Important Message given?   (If response is "NO", the following Medicare IM given date fields will be blank) Date Medicare IM given:   Date Additional Medicare IM given:    Discharge Disposition:    Per UR Regulation:  Reviewed for med. necessity/level of care/duration of stay  If discussed at Long Length of Stay Meetings, dates discussed:    Comments:  04/07/2012 0930 Darlyne Russian RN, CCM Utilization review completed.  Patient admitted from [facility].

## 2012-04-08 NOTE — Progress Notes (Signed)
Patient ID: Shirley Bond, female   DOB: [birth year], 78 y.o.   MRN: [id number]    Subjective: C/O dry mouth  Objective: Vital signs in last 24 hours: Temp:  [97.9 F (36.6 C)-98.2 F (36.8 C)] 98.2 F (36.8 C) (05/07 1044) Pulse Rate:  [65-74] 74  (05/07 1044) Resp:  [18-20] 18  (05/07 1044) BP: (129-173)/(55-72) 173/63 mmHg (05/07 1044) SpO2:  [97 %-99 %] 99 % (05/07 1044) Last BM Date: 04/07/12  Intake/Output from previous day:   Intake/Output this shift: Total I/O In: 60 [P.O.:60] Out: -   General appearance: cooperative and no distress Eyes: left periorbital contusion, EOMI, Forehead lac CDI Resp: clear to auscultation bilaterally GI: soft, non-tender; bowel sounds normal; no masses,  no organomegaly Neuro: F/C X 4 ext, speech intelligible  Lab Results: CBC   Basename 04/08/12 0530 04/07/12 0844 04/07/12 0820  WBC 7.2 -- 10.5  HGB 10.2* 12.9 --  HCT 29.2* 38.0 --  PLT 259 -- 308   BMET  Basename 04/08/12 0530 04/07/12 0844 04/07/12 0820  NA 139 138 --  K 3.1* 3.6 --  CL 104 104 --  CO2 27 -- 24  GLUCOSE 127* 156* --  BUN 14 25* --  CREATININE 0.68 0.90 --  CALCIUM 8.9 -- 9.6   PT/INR  Basename 04/07/12 0820  LABPROT 13.0  INR 0.96   ABG No results found for this basename: PHART:2,PCO2:2,PO2:2,HCO3:2 in the last 72 hours  Studies/Results: Ct Head Wo Contrast  04/07/2012  *RADIOLOGY REPORT*  Clinical Data: Trauma, fall  CT HEAD WITHOUT CONTRAST,CT CERVICAL SPINE WITHOUT CONTRAST  Technique:  Contiguous axial images were obtained from the base of the skull through the vertex without contrast.,Technique: Multidetector CT imaging of the cervical spine was performed. Multiplanar CT image reconstructions were also generated.  Comparison: 10/16/2011  Findings: No skull fracture is noted.  The mastoid air cells are unremarkable.  Minimal mucosal thickening ethmoid air cells.  There is soft tissue swelling and skin staples in the left frontal scalp laterally.   In axial image nine there is a 5 mm focus of high attenuation in the left frontal lobe anteriorly adjacent to the midline.  This is highly suspicious for small  focal parenchymal hemorrhage. Clinical correlation is necessary.  Follow-up examination is recommended.  No  mass effect or midline shift.  Mild cerebral atrophy. Atherosclerotic calcifications of carotid siphon are noted.  No acute infarction.  No mass lesion is noted on this unenhanced scan.  Periventricular and patchy subcortical white matter decreased attenuation is probable due to chronic small vessel ischemic changes.  IMPRESSION:  There is soft tissue swelling subcutaneous stranding and skin staples in the left frontal region.In axial image nine there is a 5 mm focus of high attenuation in the left frontal lobe anteriorly adjacent to the midline.  This is highly suspicious for small focal parenchymal hemorrhage.  Clinical correlation is necessary. Follow-up examination is recommended.  Stable cerebral atrophy.  No acute infarction.  No mass lesion is noted on this unenhanced scan.  CT cervical spine findings:  Axial images of the cervical spine shows no acute fracture. Computer processed images shows no acute fracture.  There is about 4 mm probable chronic anterolisthesis C4 on C5 vertebral body.  No prevertebral soft tissue swelling.  Cervical airway is patent. There is disc space flattening with mild anterior and mild posterior spurring at C5-C6 and C6-C7 level. Multilevel facet degenerative changes are noted.  Impression: 1. No acute fracture. 2.  Probable  chronic 4 mm anterolisthesis C4 on C5 vertebral body. Clinical correlation is necessary. 3.  Degenerative changes C5-C6 and C6-C7 level.  No prevertebral soft tissue swelling.  I discussed with Dr. Radford Pax  Original Report Authenticated By: Natasha Mead, M.D.   Ct Cervical Spine Wo Contrast  04/07/2012  *RADIOLOGY REPORT*  Clinical Data: Trauma, fall  CT HEAD WITHOUT CONTRAST,CT CERVICAL SPINE  WITHOUT CONTRAST  Technique:  Contiguous axial images were obtained from the base of the skull through the vertex without contrast.,Technique: Multidetector CT imaging of the cervical spine was performed. Multiplanar CT image reconstructions were also generated.  Comparison: 10/16/2011  Findings: No skull fracture is noted.  The mastoid air cells are unremarkable.  Minimal mucosal thickening ethmoid air cells.  There is soft tissue swelling and skin staples in the left frontal scalp laterally.  In axial image nine there is a 5 mm focus of high attenuation in the left frontal lobe anteriorly adjacent to the midline.  This is highly suspicious for small  focal parenchymal hemorrhage. Clinical correlation is necessary.  Follow-up examination is recommended.  No  mass effect or midline shift.  Mild cerebral atrophy. Atherosclerotic calcifications of carotid siphon are noted.  No acute infarction.  No mass lesion is noted on this unenhanced scan.  Periventricular and patchy subcortical white matter decreased attenuation is probable due to chronic small vessel ischemic changes.  IMPRESSION:  There is soft tissue swelling subcutaneous stranding and skin staples in the left frontal region.In axial image nine there is a 5 mm focus of high attenuation in the left frontal lobe anteriorly adjacent to the midline.  This is highly suspicious for small focal parenchymal hemorrhage.  Clinical correlation is necessary. Follow-up examination is recommended.  Stable cerebral atrophy.  No acute infarction.  No mass lesion is noted on this unenhanced scan.  CT cervical spine findings:  Axial images of the cervical spine shows no acute fracture. Computer processed images shows no acute fracture.  There is about 4 mm probable chronic anterolisthesis C4 on C5 vertebral body.  No prevertebral soft tissue swelling.  Cervical airway is patent. There is disc space flattening with mild anterior and mild posterior spurring at C5-C6 and C6-C7  level. Multilevel facet degenerative changes are noted.  Impression: 1. No acute fracture. 2.  Probable chronic 4 mm anterolisthesis C4 on C5 vertebral body. Clinical correlation is necessary. 3.  Degenerative changes C5-C6 and C6-C7 level.  No prevertebral soft tissue swelling.  I discussed with Dr. Radford Pax  Original Report Authenticated By: Natasha Mead, M.D.    Anti-infectives: Anti-infectives    None      Assessment/Plan: Fall TBI/SAH - exam stable, no F/U CT needed per NS provided neuro exam is stable Forehead lac - no bleeding Multiple medical issues - per primary service  LOS: 1 day    Violeta Gelinas, MD, MPH, FACS Pager: [phone]  04/08/2012

## 2012-04-08 NOTE — Progress Notes (Addendum)
Subjective: Patient seen and examined ,agitated and confused  Objective: Vital signs in last 24 hours: Temp:  [97.5 F (36.4 C)-98.2 F (36.8 C)] 97.9 F (36.6 C) (05/07 0623) Pulse Rate:  [52-69] 68  (05/07 0623) Resp:  [15-20] 20  (05/07 0623) BP: (129-204)/(55-88) 165/72 mmHg (05/07 0623) SpO2:  [95 %-100 %] 97 % (05/07 0623) Weight:  [53.3 kg (117 lb 8.1 oz)] 53.3 kg (117 lb 8.1 oz) (05/06 1043) Weight change:  Last BM Date: 04/07/12  Intake/Output from previous day:       Physical Exam: General: Alert, agitated,confused HEENT: left forehead laceration Heart: Regular rate and rhythm, without murmurs, rubs, gallops. Lungs: Clear to auscultation bilaterally. Abdomen: Soft, nontender, nondistended, positive bowel sounds. Extremities: No clubbing cyanosis or edema with positive pedal pulses. Neuro: moves all extremities,confused    Lab Results: Results for orders placed during the hospital encounter of 04/07/12 (from the past 24 hour(s))  CBC     Status: Normal   Collection Time   04/07/12  8:20 AM      Component Value Range   WBC 10.5  4.0 - 10.5 (K/uL)   RBC 3.98  3.87 - 5.11 (MIL/uL)   Hemoglobin 12.9  12.0 - 15.0 (g/dL)   HCT 19.1  47.8 - 29.5 (%)   MCV 92.0  78.0 - 100.0 (fL)   MCH 32.4  26.0 - 34.0 (pg)   MCHC 35.2  30.0 - 36.0 (g/dL)   RDW 62.1  30.8 - 65.7 (%)   Platelets 308  150 - 400 (K/uL)  DIFFERENTIAL     Status: Normal   Collection Time   04/07/12  8:20 AM      Component Value Range   Neutrophils Relative 66  43 - 77 (%)   Neutro Abs 6.9  1.7 - 7.7 (K/uL)   Lymphocytes Relative 23  12 - 46 (%)   Lymphs Abs 2.4  0.7 - 4.0 (K/uL)   Monocytes Relative 8  3 - 12 (%)   Monocytes Absolute 0.9  0.1 - 1.0 (K/uL)   Eosinophils Relative 3  0 - 5 (%)   Eosinophils Absolute 0.3  0.0 - 0.7 (K/uL)   Basophils Relative 1  0 - 1 (%)   Basophils Absolute 0.1  0.0 - 0.1 (K/uL)  BASIC METABOLIC PANEL     Status: Abnormal   Collection Time   04/07/12  8:20 AM    Component Value Range   Sodium 136  135 - 145 (mEq/L)   Potassium 3.7  3.5 - 5.1 (mEq/L)   Chloride 100  96 - 112 (mEq/L)   CO2 24  19 - 32 (mEq/L)   Glucose, Bld 153 (*) 70 - 99 (mg/dL)   BUN 24 (*) 6 - 23 (mg/dL)   Creatinine, Ser 8.46  0.50 - 1.10 (mg/dL)   Calcium 9.6  8.4 - 96.2 (mg/dL)   GFR calc non Af Amer 76 (*) >90 (mL/min)   GFR calc Af Amer 88 (*) >90 (mL/min)  PROTIME-INR     Status: Normal   Collection Time   04/07/12  8:20 AM      Component Value Range   Prothrombin Time 13.0  11.6 - 15.2 (seconds)   INR 0.96  0.00 - 1.49   POCT I-STAT, CHEM 8     Status: Abnormal   Collection Time   04/07/12  8:44 AM      Component Value Range   Sodium 138  135 - 145 (mEq/L)   Potassium 3.6  3.5 - 5.1 (mEq/L)   Chloride 104  96 - 112 (mEq/L)   BUN 25 (*) 6 - 23 (mg/dL)   Creatinine, Ser 1.61  0.50 - 1.10 (mg/dL)   Glucose, Bld 096 (*) 70 - 99 (mg/dL)   Calcium, Ion 0.45  4.09 - 1.32 (mmol/L)   TCO2 25  0 - 100 (mmol/L)   Hemoglobin 12.9  12.0 - 15.0 (g/dL)   HCT 81.1  91.4 - 78.2 (%)  GLUCOSE, CAPILLARY     Status: Normal   Collection Time   04/07/12 12:24 PM      Component Value Range   Glucose-Capillary 96  70 - 99 (mg/dL)  GLUCOSE, CAPILLARY     Status: Abnormal   Collection Time   04/07/12  4:48 PM      Component Value Range   Glucose-Capillary 123 (*) 70 - 99 (mg/dL)  GLUCOSE, CAPILLARY     Status: Abnormal   Collection Time   04/07/12  9:22 PM      Component Value Range   Glucose-Capillary 195 (*) 70 - 99 (mg/dL)   Comment 1 Documented in Chart     Comment 2 Notify RN    GLUCOSE, CAPILLARY     Status: Abnormal   Collection Time   04/08/12 12:45 AM      Component Value Range   Glucose-Capillary 137 (*) 70 - 99 (mg/dL)   Comment 1 Documented in Chart     Comment 2 Notify RN    GLUCOSE, CAPILLARY     Status: Abnormal   Collection Time   04/08/12  4:53 AM      Component Value Range   Glucose-Capillary 129 (*) 70 - 99 (mg/dL)   Comment 1 Documented in Chart     Comment  2 Notify RN    BASIC METABOLIC PANEL     Status: Abnormal   Collection Time   04/08/12  5:30 AM      Component Value Range   Sodium 139  135 - 145 (mEq/L)   Potassium 3.1 (*) 3.5 - 5.1 (mEq/L)   Chloride 104  96 - 112 (mEq/L)   CO2 27  19 - 32 (mEq/L)   Glucose, Bld 127 (*) 70 - 99 (mg/dL)   BUN 14  6 - 23 (mg/dL)   Creatinine, Ser 9.56  0.50 - 1.10 (mg/dL)   Calcium 8.9  8.4 - 21.3 (mg/dL)   GFR calc non Af Amer 78 (*) >90 (mL/min)   GFR calc Af Amer 90 (*) >90 (mL/min)  CBC     Status: Abnormal   Collection Time   04/08/12  5:30 AM      Component Value Range   WBC 7.2  4.0 - 10.5 (K/uL)   RBC 3.20 (*) 3.87 - 5.11 (MIL/uL)   Hemoglobin 10.2 (*) 12.0 - 15.0 (g/dL)   HCT 08.6 (*) 57.8 - 46.0 (%)   MCV 91.3  78.0 - 100.0 (fL)   MCH 31.9  26.0 - 34.0 (pg)   MCHC 34.9  30.0 - 36.0 (g/dL)   RDW 46.9  62.9 - 52.8 (%)   Platelets 259  150 - 400 (K/uL)    Studies/Results:   Medications:    . lidocaine  1 patch Transdermal Daily  . methadone  2.5 mg Oral Daily  . midazolam  2.5 mg Intravenous Once  . olopatadine  1 drop Both Eyes BID  . DISCONTD: ketotifen  1 drop Both Eyes BID    acetaminophen, acetaminophen, bisacodyl, fentaNYL, haloperidol lactate,  hydrALAZINE, midazolam, midazolam, ondansetron (ZOFRAN) IV, ondansetron     . dextrose 5 % and 0.9% NaCl 75 mL/hr at 04/08/12 0046    Assessment/Plan:  Principal Problem:  *head injury /minor Cerebral hemorrhage Appreciated Dr Ethelene Browns input ,continue to observe for now,no need for re-imaging unless if status worsens  . Active Problems:  Fall PT consult  Dizziness Cont to observe on telemetry ,check vitals for orthostatsis when possible.check Echo ,cont to gently hydrate. Cont meclizine prn  HTN (hypertension) Cont metoprolol,hold lisinopril for now ,check orthostatic vitals Hypokalemia:  replete ,check mag Dementia with agitation: Haldol prn  Osteoarthritis/chronic pain Tylenol prn, continue methadone   COPD  (chronic obstructive pulmonary disease) Stable,nebs prn Disposition:eventually back to SNF when stable   LOS: 1 day   [place] Ice 04/08/2012, 7:35 AM

## 2012-04-08 NOTE — Progress Notes (Signed)
Speech Language Pathology Dysphagia Treatment Patient Details Name: Shirley Bond MRN: [id number] DOB: 1924-05-17 Today's Date: 04/08/2012 Time: 1036-1050 SLP Time Calculation (min): 14 min  Assessment / Plan / Recommendation Clinical Impression  Demonstrates increased restlessness and moments of agitation in this session, eyes closed shut, total verbal/tactile cues to follow basic 1 step directions.  Continues to demonstrate behaviors of a Rancho TBI level IV (confused, agitated).  Patient did accept PO sips of water without evidence of aspiration.  Educated sitter at the bedside on best methods of interacting to reduce agitative episodes.     Diet Recommendation  Continue with Current Diet: Dysphagia 1 (puree);Thin liquid    SLP Plan Continue with current plan of care   Pertinent Vitals/Pain n/a   Swallowing Goals  SLP Swallowing Goals Patient will consume recommended diet without observed clinical signs of aspiration with: Supervision/safety Swallow Study Goal #1 - Progress: Progressing toward goal Patient will utilize recommended strategies during swallow to increase swallowing safety with: Supervision/safety Swallow Study Goal #2 - Progress: Progressing toward goal  Treatment focused on: Skilled observation of diet tolerance;Patient/family/caregiver education Treatment Methods/Modalities: Skilled observation Patient observed directly with PO's: Yes Type of PO's observed: Thin liquids Feeding: Needs assist Liquids provided via: Cup Oral Phase Signs & Symptoms: Anterior loss/spillage Type of cueing: Tactile;Verbal;Visual Amount of cueing: Maximal   Myra Rude, M.S.,CCC-SLP Pager [phone] 04/08/2012, 11:01 AM

## 2012-04-09 DIAGNOSIS — E119 Type 2 diabetes mellitus without complications: Secondary | ICD-10-CM

## 2012-04-09 DIAGNOSIS — I619 Nontraumatic intracerebral hemorrhage, unspecified: Secondary | ICD-10-CM

## 2012-04-09 DIAGNOSIS — F0390 Unspecified dementia without behavioral disturbance: Secondary | ICD-10-CM

## 2012-04-09 DIAGNOSIS — R42 Dizziness and giddiness: Secondary | ICD-10-CM

## 2012-04-09 LAB — CBC
HCT: 29.6 % — ABNORMAL LOW (ref 36.0–46.0)
Hemoglobin: 10.2 g/dL — ABNORMAL LOW (ref 12.0–15.0)
MCH: 31.5 pg (ref 26.0–34.0)
MCHC: 34.5 g/dL (ref 30.0–36.0)
MCV: 91.4 fL (ref 78.0–100.0)
Platelets: 260 10*3/uL (ref 150–400)
RBC: 3.24 MIL/uL — ABNORMAL LOW (ref 3.87–5.11)
RDW: 12.2 % (ref 11.5–15.5)
WBC: 6.5 10*3/uL (ref 4.0–10.5)

## 2012-04-09 LAB — BASIC METABOLIC PANEL
BUN: 7 mg/dL (ref 6–23)
Calcium: 8.8 mg/dL (ref 8.4–10.5)
GFR calc non Af Amer: 81 mL/min — ABNORMAL LOW (ref >90)
Glucose, Bld: 122 mg/dL — ABNORMAL HIGH (ref 70–99)

## 2012-04-09 MED ORDER — MAGNESIUM SULFATE 40 MG/ML IJ SOLN
2.0000 g | Freq: Once | INTRAMUSCULAR | Status: AC
  Administered 2012-04-09: 2 g via INTRAVENOUS
  Filled 2012-04-09: qty 50

## 2012-04-09 MED ORDER — TRAMADOL HCL 50 MG PO TABS
50.0000 mg | ORAL_TABLET | Freq: Four times a day (QID) | ORAL | Status: DC | PRN
  Administered 2012-04-09 – 2012-04-15 (×7): 50 mg via ORAL
  Filled 2012-04-09 (×7): qty 1

## 2012-04-09 MED ORDER — LORAZEPAM 2 MG/ML IJ SOLN
0.5000 mg | Freq: Once | INTRAMUSCULAR | Status: AC
  Administered 2012-04-09: 0.5 mg via INTRAVENOUS

## 2012-04-09 MED ORDER — LORAZEPAM 2 MG/ML IJ SOLN
INTRAMUSCULAR | Status: AC
  Filled 2012-04-09: qty 1

## 2012-04-09 MED ORDER — POTASSIUM CHLORIDE 20 MEQ/15ML (10%) PO LIQD
40.0000 meq | Freq: Once | ORAL | Status: AC
  Administered 2012-04-09: 40 meq via ORAL
  Filled 2012-04-09: qty 30

## 2012-04-09 NOTE — Evaluation (Signed)
Physical Therapy Evaluation Patient Details Name: Shirley Bond MRN: [id number] DOB: October 26, 1924 Today's Date: 04/09/2012 Time: 2440-1027 PT Time Calculation (min): 48 min  PT Assessment / Plan / Recommendation Clinical Impression  Pt is 78 y/o female admitted for s/p fall with left frontal laceration and SDH, TBI.  Pt with mild dementia however more confusion and cognitive deficits after fall.  Pt waxing and waning between Rancho IV and V.  Pt having difficulty recognizing objects.  Spoke with daughter and pt with increase confusion.  PTA pt able to recognize daughter, husband and ambulate in room with mod (I) and pt unable to recognize family or ambulate without assist.      PT Assessment  Patient needs continued PT services    Follow Up Recommendations  Skilled nursing facility    Equipment Recommendations  None recommended by SLP;None recommended by PT    Frequency Min 3X/week    Precautions / Restrictions Precautions Precautions: Fall Restrictions Weight Bearing Restrictions: No   Pertinent Vitals/Pain No c/o pain      Mobility  Bed Mobility Bed Mobility: Supine to Sit Supine to Sit: 2: Max assist;HOB flat Details for Bed Mobility Assistance: (A) to initiate LE movement to EOB and (A) to elevate trunk OOB with max cues for technique and max motivation needed. Transfers Transfers: Sit to Stand;Stand to Sit Sit to Stand: From elevated surface;1: +2 Total assist;From bed Sit to Stand: Patient Percentage: 60% Stand to Sit: 1: +2 Total assist;To chair/3-in-1 Stand to Sit: Patient Percentage: 60% Details for Transfer Assistance: (A) to initiate transfer and slowly descend with correct body position.   Ambulation/Gait Ambulation/Gait Assistance: 1: +2 Total assist Ambulation/Gait: Patient Percentage: 60 Ambulation Distance (Feet): 10 Feet Assistive device: 2 person hand held assist Ambulation/Gait Assistance Details: (A) to maintain balance.  Pt tends to lean to the  left side.  Pt with small shuffle gait. Gait Pattern: Decreased step length - right;Decreased step length - left;Shuffle;Trunk flexed    Exercises     PT Goals Acute Rehab PT Goals PT Goal Formulation: With patient Time For Goal Achievement: 04/23/12 Potential to Achieve Goals: Good Pt will go Supine/Side to Sit: with supervision PT Goal: Supine/Side to Sit - Progress: Goal set today Pt will Sit at Edge of Bed: with supervision;1-2 min PT Goal: Sit at Edge Of Bed - Progress: Goal set today Pt will go Sit to Supine/Side: with supervision PT Goal: Sit to Supine/Side - Progress: Goal set today Pt will go Sit to Stand: with min assist PT Goal: Sit to Stand - Progress: Goal set today Pt will go Stand to Sit: with min assist PT Goal: Stand to Sit - Progress: Goal set today Pt will Stand: with min assist;1 - 2 min PT Goal: Stand - Progress: Goal set today Pt will Ambulate: 16 - 50 feet;with min assist;with rolling walker PT Goal: Ambulate - Progress: Goal set today  Visit Information  Last PT Received On: 04/09/12 Assistance Needed: +2 PT/OT Co-Evaluation/Treatment: Yes    Subjective Data  Subjective: "I was in jailhouse last night."   Prior Functioning  Home Living Available Help at Discharge: Skilled Nursing Facility (Simultaneous filing. User may not have seen previous data.) Type of Home: Skilled Nursing Facility (Simultaneous filing. User may not have seen previous data.) Prior Function Level of Independence: Needs assistance Needs Assistance: Transfers Transfer Assistance: Pt uses furniture for steadying and balance within room.  Pt uses DME for ambulation in hallway with assistance from staff. 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 Communication Communication: Receptive difficulties (Simultaneous filing. User may not have seen previous data.) Dominant Hand:  Right (Simultaneous filing. User may not have seen previous data.)    Cognition  Overall Cognitive Status: Impaired Area of Impairment: Attention;Following commands;Problem solving;Executive functioning;Rancho level Arousal/Alertness: Lethargic Orientation Level: Person Behavior During Session: Agitated (Pt can be easily agitated.) Current Attention Level: Sustained Following Commands: Follows one step commands inconsistently Problem Solving: Pt having difficulty problem solving when attempting to brush teeth.  Pt picking up different objects unable to determine what objects are used for examples:  placing shampoo on chin, taking end of ice bag and using as toothbrush. Cognition - Other Comments: Pt reports that she was in the jailhouse last night for dinner.  Initially reports that she thinks she is currently in [place].     Extremity/Trunk Assessment Right Upper Extremity Assessment RUE ROM/Strength/Tone: Deficits RUE ROM/Strength/Tone Deficits: Pt reports difficulty moving RUE due to past shoulder surgery. Able to reach back of head and brush hair without difficulty. Unable to MMT due to cognitive deficits, [facility] for tasks assessed. Left Upper Extremity Assessment LUE ROM/Strength/Tone: Deficits LUE ROM/Strength/Tone Deficits: Pt reports difficulty moving RUE due to past shoulder surgery. Able to reach back of head and brush hair without difficulty. Unable to MMT due to cognitive deficits, [facility] for tasks assessed. Right Lower Extremity Assessment RLE ROM/Strength/Tone: Unable to fully assess;Due to impaired cognition Left Lower Extremity Assessment LLE ROM/Strength/Tone: Unable to fully assess;Due to impaired cognition   Balance Balance Balance Assessed: Yes Static Sitting Balance Static Sitting - Balance Support: Feet supported Static Sitting - Level of Assistance: 5: Stand by assistance Static Sitting - Comment/# of Minutes: stand by assist for safety  End of Session PT - End of  Session Equipment Utilized During Treatment: Gait belt Activity Tolerance: Patient limited by fatigue Patient left: in chair;with call bell/phone within reach;with family/visitor present Nurse Communication: Mobility status   Shirley Bond 04/09/2012, 3:58 PM Jake Shark, PT DPT [phone]

## 2012-04-09 NOTE — Progress Notes (Signed)
Okay to signoff  This patient has been seen and I agree with the findings and treatment plan.  Marta Lamas. Gae Bon, MD, FACS [phone] (pager) [phone] (direct pager) Trauma Surgeon

## 2012-04-09 NOTE — Progress Notes (Signed)
Patient ID: Shirley Bond, female   DOB: [birth year], 78 y.o.   MRN: [id number]   LOS: 2 days   Subjective: No c/o.  Objective: Vital signs in last 24 hours: Temp:  [98.1 F (36.7 C)-98.3 F (36.8 C)] 98.3 F (36.8 C) (05/08 0600) Pulse Rate:  [74-85] 78  (05/08 0600) Resp:  [18-20] 20  (05/08 0600) BP: (145-183)/(52-95) 145/52 mmHg (05/08 0600) SpO2:  [96 %-99 %] 98 % (05/08 0600) Last BM Date: 04/07/12  Lab Results:  CBC  Basename 04/09/12 0530 04/08/12 0530  WBC 6.5 7.2  HGB 10.2* 10.2*  HCT 29.6* 29.2*  PLT 260 259   BMET  Basename 04/09/12 0530 04/08/12 0530  NA 140 139  K 3.3* 3.1*  CL 106 104  CO2 26 27  GLUCOSE 122* 127*  BUN 7 14  CREATININE 0.60 0.68  CALCIUM 8.8 8.9    General appearance: alert and no distress Resp: clear to auscultation bilaterally Cardio: regular rate and rhythm Incision/Wound: C/D/I  Assessment/Plan: Fall TBI -- Stable Scalp lac -- Will need staples out early next week  Trauma will sign off. Please call with further questions.   Freeman Caldron, PA-C Pager: [phone] General Trauma PA Pager: [phone]   04/09/2012

## 2012-04-09 NOTE — Progress Notes (Signed)
Occupational Therapy Evaluation Patient Details Name: Shirley Bond MRN: [id number] DOB: [birth year] Today's Date: 04/09/2012 Time: 4098-1191 OT Time Calculation (min): 34 min  OT Assessment / Plan / Recommendation Clinical Impression  Pt s/p fall with left frontal laceration and SDH, TBI. Pt with mild dementia however more confusion and cognitive deficits after fall.  Pt fluctuating between Rancho IV and V during session.  Will benefit from acute OT to address below problem list in prep for d/c to [facility].    OT Assessment  Patient needs continued OT Services    Follow Up Recommendations  Skilled nursing facility;Supervision/Assistance - 24 hour    Equipment Recommendations  None recommended by SLP;None recommended by PT;Defer to next venue    Frequency Min 2X/week    Precautions / Restrictions Precautions Precautions: Fall Restrictions Weight Bearing Restrictions: No   Pertinent Vitals/Pain     ADL  Grooming: Performed;Teeth care;Brushing hair;Maximal assistance Where Assessed - Grooming: Standing at sink Upper Body Bathing: Simulated;Maximal assistance Where Assessed - Upper Body Bathing: Sitting, bed Lower Body Bathing: Simulated;Maximal assistance Where Assessed - Lower Body Bathing: Sit to stand from bed Upper Body Dressing: Simulated;Maximal assistance Where Assessed - Upper Body Dressing: Sitting, bed Lower Body Dressing: Simulated;Maximal assistance Where Assessed - Lower Body Dressing: Sitting, bed Toilet Transfer: Simulated;+2 Total assistance;Comment for patient % (60%) Toilet Transfer Method: Ambulating Toilet Transfer Equipment: Other (comment) (chair) Equipment Used: Gait belt;Other (comment) (two person hand held assist) Ambulation Related to ADLs: Pt ambulated to sink with 2 person hand held assist and small shuffled steps. ADL Comments: Max assist during grooming ADL to identify grooming objects and their correct use.  Pt fixated on blue clip of  ice bad and reported that it was her toothbrush.  Pt with visual deficits and swollen left eye also possibly affecting identifying objects.     OT Goals Acute Rehab OT Goals OT Goal Formulation: Patient unable to participate in goal setting Time For Goal Achievement: 04/23/12 Potential to Achieve Goals: Fair ADL Goals Pt Will Perform Grooming: with min assist;Sitting, chair;Sitting, edge of bed ADL Goal: Grooming - Progress: Goal set today Pt Will Transfer to Toilet: with min assist;3-in-1;with DME;Stand pivot transfer ADL Goal: Toilet Transfer - Progress: Goal set today Miscellaneous OT Goals Miscellaneous OT Goal #1: Pt will increase attention to selective during BADL tasks. OT Goal: Miscellaneous Goal #1 - Progress: Goal set today Miscellaneous OT Goal #2: Pt will correctly identify ADL items and correct use for item with min verbal cueing 75% of time. OT Goal: Miscellaneous Goal #2 - Progress: Goal set today  Visit Information  Last OT Received On: 04/09/12 Assistance Needed: +2 PT/OT Co-Evaluation/Treatment: Yes    Subjective Data      Prior Functioning  Home Living Available Help at Discharge: Skilled Nursing Facility (Simultaneous filing. User may not have seen previous data.) Type of Home: Skilled Nursing Facility (Simultaneous filing. User may not have seen previous data.) Prior Function Level of Independence: Needs assistance Needs Assistance: Transfers Transfer Assistance: Pt uses furniture for steadying and balance within room.  Pt uses DME for ambulation in hallway with assistance from staff. 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 Per daughter report, pt independent with bathing and dressing. Communication Communication: Receptive difficulties (Simultaneous filing. User may not have seen previous data.) Dominant Hand: Right (Simultaneous filing. User may not have seen previous data.)      Cognition  Overall Cognitive Status: Impaired Area of  Impairment: Attention;Following commands;Problem solving;Executive functioning;Rancho level Arousal/Alertness: Lethargic Orientation Level: Person Behavior During Session: Agitated (Pt can be easily agitated.) Current Attention Level: Sustained Following Commands: Follows one step commands inconsistently Problem Solving: Pt having difficulty problem solving when attempting to brush teeth.  Pt picking up different objects unable to determine what objects are used for examples:  placing shampoo on chin, taking end of ice bag and using as toothbrush. Cognition - Other Comments: Pt reports that she was in the jailhouse last night for dinner.  Initially reports that she thinks she is currently in [place].     Extremity/Trunk Assessment Right Upper Extremity Assessment RUE ROM/Strength/Tone: Deficits RUE ROM/Strength/Tone Deficits: Pt reports difficulty moving RUE due to past shoulder surgery. Able to reach back of head and brush hair without difficulty. Unable to MMT due to cognitive deficits, [facility] for tasks assessed. Left Upper Extremity Assessment LUE ROM/Strength/Tone: Deficits LUE ROM/Strength/Tone Deficits: Pt reports difficulty moving RUE due to past shoulder surgery. Able to reach back of head and brush hair without difficulty. Unable to MMT due to cognitive deficits, [facility] for tasks assessed. Right Lower Extremity Assessment RLE ROM/Strength/Tone: Unable to fully assess;Due to impaired cognition Left Lower Extremity Assessment LLE ROM/Strength/Tone: Unable to fully assess;Due to impaired cognition   Mobility Bed Mobility Bed Mobility: Supine to Sit Supine to Sit: 2: Max assist;HOB flat Details for Bed Mobility Assistance: (A) to initiate LE movement to EOB and (A) to elevate trunk OOB with max cues for technique and max motivation needed. Transfers Sit to Stand: From elevated surface;1: +2 Total assist;From bed Sit to Stand: Patient  Percentage: 60% Stand to Sit: 1: +2 Total assist;To chair/3-in-1 Stand to Sit: Patient Percentage: 60% Details for Transfer Assistance: (A) to initiate transfer and slowly descend with correct body position.     Exercise    Balance Balance Balance Assessed: Yes Static Sitting Balance Static Sitting - Balance Support: Feet supported Static Sitting - Level of Assistance: 5: Stand by assistance Static Sitting - Comment/# of Minutes: stand by assist for safety  End of Session OT - End of Session Equipment Utilized During Treatment: Gait belt Activity Tolerance: Patient limited by fatigue Patient left: in chair;with family/visitor present;Other (comment) (with sitter) Nurse Communication: Mobility status  04/09/2012 Cipriano Mile OTR/L Pager [phone] Office [phone]   Cipriano Mile 04/09/2012, 4:05 PM

## 2012-04-09 NOTE — Progress Notes (Signed)
Clinical Social Work-CSW received return phone call from [facility] Fall supervisor who relayed that DSS in in process of transitioning pt to long term medicaid and this will not delay pt d/c back to SNF-This CSW will notify unit based CSW- Jodean Lima, [phone]

## 2012-04-09 NOTE — Progress Notes (Signed)
Shirley Bond is a 78 y.o. female admitted after a fall, noted to have questionable intracerebral bleed. I have reviewed her chart, seen and examined her at bedside. Appreciate Trauma surgery/NS. Patient poor communicator due to confusion. ?baseline.  SUBJECTIVE Denies any complaints.   1. Dizziness   2. Fall   3. Frontal lobe contusion   4. Laceration of head     Past Medical History  Diagnosis Date  . COPD (chronic obstructive pulmonary disease)   . Frequent UTI   . Hypertension   . Arthritis   . Osteoarthritis   . Hypercholesteremia   . Asthma   . Thyroid disease   . Environmental allergies   . Osteopenia   . Macular degeneration   . Hearing loss   . Constipation   . Back pain   . Dementia   . PONV (postoperative nausea and vomiting)   . Hypothyroidism   . Blood transfusion     " no reaction to transfusion "   Current Facility-Administered Medications  Medication Dose Route Frequency Provider Last Rate Last Dose  . acetaminophen (TYLENOL) tablet 650 mg  650 mg Oral Q6H PRN Antonieta Pert, MD       Or  . acetaminophen (TYLENOL) suppository 650 mg  650 mg Rectal Q6H PRN Antonieta Pert, MD      . bisacodyl (DULCOLAX) suppository 10 mg  10 mg Rectal Daily PRN Antonieta Pert, MD      . budesonide-formoterol (SYMBICORT) 160-4.5 MCG/ACT inhaler 2 puff  2 puff Inhalation BID Antonieta Pert, MD   2 puff at 04/09/12 0825  . DULoxetine (CYMBALTA) DR capsule 60 mg  60 mg Oral Daily Antonieta Pert, MD   60 mg at 04/09/12 4098  . haloperidol lactate (HALDOL) injection 2 mg  2 mg Intravenous Q6H PRN Antonieta Pert, MD   2 mg at 04/09/12 0236  . hydrALAZINE (APRESOLINE) injection 10 mg  10 mg Intravenous Q8H PRN Antonieta Pert, MD      . levothyroxine (SYNTHROID, LEVOTHROID) tablet 50 mcg  50 mcg Oral Q breakfast Antonieta Pert, MD   50 mcg at 04/09/12 0918  . lidocaine (LIDODERM) 5 % 1 patch  1 patch Transdermal Daily Antonieta Pert, MD   1 patch at 04/09/12  0918  . lubiprostone (AMITIZA) capsule 24 mcg  24 mcg Oral BID WC Antonieta Pert, MD   24 mcg at 04/09/12 0800  . magnesium sulfate IVPB 2 g 50 mL  2 g Intravenous Once Jakaden Ouzts, MD      . meclizine (ANTIVERT) tablet 12.5 mg  12.5 mg Oral Q8H PRN Antonieta Pert, MD   12.5 mg at 04/08/12 1838  . methadone (DOLOPHINE) tablet 2.5 mg  2.5 mg Oral Daily Antonieta Pert, MD      . metoprolol succinate (TOPROL-XL) 24 hr tablet 50 mg  50 mg Oral Daily Antonieta Pert, MD   50 mg at 04/09/12 0919  . olopatadine (PATANOL) 0.1 % ophthalmic solution 1 drop  1 drop Both Eyes BID Antonieta Pert, MD   1 drop at 04/09/12 0920  . ondansetron (ZOFRAN) tablet 4 mg  4 mg Oral Q6H PRN Antonieta Pert, MD       Or  . ondansetron [facility]) injection 4 mg  4 mg Intravenous Q6H PRN Antonieta Pert, MD      . potassium chloride 10 mEq in 100 mL IVPB  10 mEq Intravenous Q1 Hr x  4 Antonieta Pert, MD   10 mEq at 04/08/12 1502  . potassium chloride 20 MEQ/15ML (10%) liquid 40 mEq  40 mEq Oral Once Thorn Demas, MD      . senna (SENOKOT) tablet 8.6 mg  1 tablet Oral BID PRN Antonieta Pert, MD      . simvastatin (ZOCOR) tablet 20 mg  20 mg Oral q1800 Antonieta Pert, MD   20 mg at 04/08/12 1838  . traMADol (ULTRAM) tablet 50 mg  50 mg Oral Q6H PRN Deion Swift, MD      . DISCONTD: dextrose 5 %-0.9 % sodium chloride infusion   Intravenous Continuous Antonieta Pert, MD 75 mL/hr at 04/09/12 0410    . DISCONTD: fentaNYL (SUBLIMAZE) injection 12.5 mcg  12.5 mcg Intravenous Q6H PRN Antonieta Pert, MD   12.5 mcg at 04/09/12 0500   Allergies  Allergen Reactions  . Hyomax (Hyoscyamine Sulfate) Other (See Comments)    unknown  . Other     Negative reactions to narcotics/opiates  . Oxycodone Hcl Er Other (See Comments)    unknown  . Sulfa Antibiotics Other (See Comments)    unknown   Principal Problem:  *Cerebral hemorrhage Active Problems:  Fall  Dizziness  HTN (hypertension)  Osteoarthritis  COPD  (chronic obstructive pulmonary disease)   Vital signs in last 24 hours: Temp:  [98.1 F (36.7 C)-98.3 F (36.8 C)] 98.3 F (36.8 C) (05/08 0600) Pulse Rate:  [74-85] 78  (05/08 0600) Resp:  [18-20] 20  (05/08 0600) BP: (145-183)/(52-95) 145/52 mmHg (05/08 0600) SpO2:  [94 %-99 %] 94 % (05/08 0825) Weight change:  Last BM Date: 04/07/12  Intake/Output from previous day: 05/07 0701 - 05/08 0700 In: 1065 [P.O.:240; I.V.:825] Out: 100 [Urine:100] Intake/Output this shift:    Lab Results:  Basename 04/09/12 0530 04/08/12 0530  WBC 6.5 7.2  HGB 10.2* 10.2*  HCT 29.6* 29.2*  PLT 260 259   BMET  Basename 04/09/12 0530 04/08/12 0530  NA 140 139  K 3.3* 3.1*  CL 106 104  CO2 26 27  GLUCOSE 122* 127*  BUN 7 14  CREATININE 0.60 0.68  CALCIUM 8.8 8.9    Studies/Results: No results found.  Medications: I have reviewed the patient's current medications.   Physical exam GENERAL- alert, left eye lid erythema and swelling, sutures left frontal aspect. No bleed or discharge. HEAD- normal atraumatic, no neck masses, normal thyroid, no jvd. PERRLA. RESPIRATORY- appears well, vitals normal, no respiratory distress, acyanotic, normal RR, ear and throat exam is normal, neck free of mass or lymphadenopathy, chest clear, no wheezing, crepitations, rhonchi, normal symmetric air entry CVS- regular rate and rhythm, S1, S2 normal, no murmur, click, rub or gallop ABDOMEN- abdomen is soft without significant tenderness, masses, organomegaly or guarding NEURO- moving all extremities. Calmly confused. EXTREMITIES- extremities normal, atraumatic, no cyanosis or edema  Plan   * Cerebral hemorrhage/Fall/ Dizziness- seems stable. PT consult. Mobilize.  * HTN (hypertension)- uncontrolled. Monitor off ivf.  * Osteoarthritis/chronic pain. Try ultram, d/c fentanyl.  * COPD (chronic obstructive pulmonary disease)- stable.  * hypokalemia- likely related to poor oral intake. Replenish. Patient now  feeding. * disposition- likely needs snf.    Jaan Fischel 04/09/2012 10:04 AM Pager: [phone].

## 2012-04-10 ENCOUNTER — Inpatient Hospital Stay (HOSPITAL_COMMUNITY): Payer: Medicare Other

## 2012-04-10 DIAGNOSIS — R42 Dizziness and giddiness: Secondary | ICD-10-CM

## 2012-04-10 DIAGNOSIS — E119 Type 2 diabetes mellitus without complications: Secondary | ICD-10-CM

## 2012-04-10 DIAGNOSIS — F0390 Unspecified dementia without behavioral disturbance: Secondary | ICD-10-CM

## 2012-04-10 DIAGNOSIS — I619 Nontraumatic intracerebral hemorrhage, unspecified: Secondary | ICD-10-CM

## 2012-04-10 LAB — CBC
MCH: 32.3 pg (ref 26.0–34.0)
MCV: 92.2 fL (ref 78.0–100.0)
Platelets: 296 10*3/uL (ref 150–400)
RBC: 3.34 MIL/uL — ABNORMAL LOW (ref 3.87–5.11)

## 2012-04-10 LAB — URINALYSIS, ROUTINE W REFLEX MICROSCOPIC
Glucose, UA: NEGATIVE mg/dL
Protein, ur: 30 mg/dL — AB
Specific Gravity, Urine: 1.015 (ref 1.005–1.030)

## 2012-04-10 LAB — BASIC METABOLIC PANEL
BUN: 11 mg/dL (ref 6–23)
GFR calc non Af Amer: 80 mL/min — ABNORMAL LOW (ref >90)
Glucose, Bld: 93 mg/dL (ref 70–99)
Potassium: 4 mEq/L (ref 3.5–5.1)

## 2012-04-10 MED ORDER — AMLODIPINE BESYLATE 5 MG PO TABS
5.0000 mg | ORAL_TABLET | Freq: Every day | ORAL | Status: DC
  Administered 2012-04-10 – 2012-04-11 (×2): 5 mg via ORAL
  Filled 2012-04-10 (×2): qty 1

## 2012-04-10 MED ORDER — LEVOFLOXACIN IN D5W 500 MG/100ML IV SOLN
500.0000 mg | INTRAVENOUS | Status: DC
  Administered 2012-04-10: 500 mg via INTRAVENOUS
  Filled 2012-04-10 (×2): qty 100

## 2012-04-10 MED ORDER — LEVOFLOXACIN IN D5W 750 MG/150ML IV SOLN: 750.0000 mg | INTRAVENOUS | Status: DC

## 2012-04-10 NOTE — Progress Notes (Signed)
Occupational Therapy Treatment Patient Details Name: Shirley Bond MRN: [id number] DOB: [birth year] Today's Date: 04/10/2012 Time: 7829-5621 OT Time Calculation (min): 32 min  OT Assessment / Plan / Recommendation Comments on Treatment Session Pt very lethargic during session.  Educated daughter on pt's TBI and possible reason for lethargy and encouraged daughter to give pt time to process questions and commands. Pt presents as Ranch[place] V when alert.    Follow Up Recommendations  Skilled nursing facility;Supervision/Assistance - 24 hour    Barriers to Discharge       Equipment Recommendations  Defer to next venue    Recommendations for Other Services    Frequency Min 2X/week   Plan Discharge plan remains appropriate    Precautions / Restrictions Precautions Precautions: Fall Restrictions Weight Bearing Restrictions: No   Pertinent Vitals/Pain N/A    ADL  Toilet Transfer: +2 Total assistance;Comment for patient %;Performed (40%) Toilet Transfer Method: Ambulating Toilet Transfer Equipment: Other (comment) (recliner) Toileting - Clothing Manipulation: Performed;+1 Total assistance Where Assessed - Toileting Clothing Manipulation: Sit on 3-in-1 or toilet Toileting - Hygiene: Performed;Supervision/safety Where Assessed - Toileting Hygiene: Sit on 3-in-1 or toilet Equipment Used: Gait belt;Other (comment) (two person hand held assist) Ambulation Related to ADLs: Pt ambulated ~4 feet with 2 hand held assist and small shuffled steps.  Pt with increased posterior lean. ADL Comments: Pt sat EOB ~10 minutes with intermittent min assist due to posterior pelvic lean.  Pt more alert while sitting EOB but fatigued quickly.    OT Diagnosis:    OT Problem List:   OT Treatment Interventions:     OT Goals ADL Goals Pt Will Transfer to Toilet: with min assist;3-in-1;with DME;Stand pivot transfer ADL Goal: Toilet Transfer - Progress: Progressing toward goals Miscellaneous OT  Goals Miscellaneous OT Goal #1: Pt will increase attention to selective during BADL tasks. OT Goal: Miscellaneous Goal #1 - Progress: Progressing toward goals  Visit Information  Last OT Received On: 04/10/12 Assistance Needed: +2 PT/OT Co-Evaluation/Treatment: Yes    Subjective Data      Prior Functioning       Cognition  Overall Cognitive Status: Impaired Area of Impairment: Attention;Following commands;Problem solving;Executive functioning;Rancho level Arousal/Alertness: Lethargic Orientation Level: Person Behavior During Session: Lethargic Current Attention Level: Focused Attention - Other Comments: Difficulty to sustain attention due to lethargy and unable to keep eyes open. Following Commands: Follows one step commands inconsistently    Mobility Bed Mobility Bed Mobility: Supine to Sit;Sitting - Scoot to Edge of Bed Supine to Sit: 3: Mod assist Sitting - Scoot to Edge of Bed: 2: Max assist Details for Bed Mobility Assistance: use of pad to scoot. Transfers Sit to Stand: From elevated surface;1: +2 Total assist;From bed Sit to Stand: Patient Percentage: 40% Stand to Sit: 1: +2 Total assist;To chair/3-in-1 Stand to Sit: Patient Percentage: 40% Details for Transfer Assistance: Increase assistance needed due to difficulty following commands and completing task.  Tactile cues for proper technique and (A) to maintain balance due to heavy posterior lean with knees flexed.   Exercises    Balance Balance Balance Assessed: Yes Static Sitting Balance Static Sitting - Balance Support: Feet supported Static Sitting - Level of Assistance: 2: Max assist Static Sitting - Comment/# of Minutes: ~10 minutes prior to transfer; (A) to maintain balance with cues for upright posture and prevent posterior lean.  Pt tends to stay in flexed posture however remains on heels leaning posterior.  End of Session OT - End of Session Equipment Utilized During Treatment:  Gait belt Activity  Tolerance: Patient limited by fatigue Patient left: in chair;with family/visitor present;with call bell/phone within reach;Other (comment) (with sitter) Nurse Communication: Mobility status  04/10/2012 Cipriano Mile OTR/L Pager [phone] Office [phone]  Cipriano Mile 04/10/2012, 4:10 PM

## 2012-04-10 NOTE — Clinical Social Work Note (Signed)
CSW contacted [facility] regarding pt's discharge plan and inquired about pt's baseline. Per pt's RN at [place], pt sundowns around 5-6 PM and becomes confused, but is easily redirected. Pt's daughter visits almost everyday. Pt's combativeness is not her baseline.   Pt's sitter must be d/ced for 24 hours prior to discharge.   CSW will continue to follow to facilitate discharge.   Dede Query, MSW, Theresia Majors [phone]

## 2012-04-10 NOTE — Progress Notes (Signed)
Physical Therapy Treatment Patient Details Name: Shirley Bond MRN: [id number] DOB: 1924-08-02 Today's Date: 04/10/2012 Time: 0915-1000 PT Time Calculation (min): 45 min  PT Assessment / Plan / Recommendation Comments on Treatment Session  Pt more lethargic this session and having difficulty keeping eyes open.  Difficult to assess rancho level however when alert pt presents as rancho V (confused and inapprioriate). Eduated daughter on brain injury and rancho level and reason for lethargy.  Also educated on possible sleep sequence off due to TBI.    Follow Up Recommendations  Skilled nursing facility    Equipment Recommendations  None recommended by PT    Frequency Min 3X/week   Plan Discharge plan remains appropriate;Frequency remains appropriate    Precautions / Restrictions Precautions Precautions: Fall Restrictions Weight Bearing Restrictions: No   Pertinent Vitals/Pain C/o neck pain    Mobility  Bed Mobility Bed Mobility: Supine to Sit;Sitting - Scoot to Edge of Bed Supine to Sit: 3: Mod assist Sitting - Scoot to Edge of Bed: 2: Max assist Details for Bed Mobility Assistance: use of pad to scoot. Transfers Transfers: Sit to Stand;Stand to Sit Sit to Stand: From elevated surface;1: +2 Total assist;From bed Sit to Stand: Patient Percentage: 40% Stand to Sit: 1: +2 Total assist;To chair/3-in-1 Stand to Sit: Patient Percentage: 40% Details for Transfer Assistance: Increase assistance needed due to difficulty following commands and completing task.  Tactile cues for proper technique and (A) to maintain balance due to heavy posterior lean with knees flexed. Ambulation/Gait Ambulation/Gait Assistance: 1: +2 Total assist Ambulation/Gait: Patient Percentage: 50 Ambulation Distance (Feet): 5 Feet (backing into recliner) Assistive device: 2 person hand held assist Ambulation/Gait Assistance Details: (A) to maintain balance.  Pt with increase flexed posture and narrow BOS this  session.   Gait Pattern: Decreased step length - right;Decreased step length - left;Shuffle;Trunk flexed    Exercises     PT Goals Acute Rehab PT Goals PT Goal Formulation: With patient Time For Goal Achievement: 04/23/12 Potential to Achieve Goals: Good Pt will go Supine/Side to Sit: with supervision PT Goal: Supine/Side to Sit - Progress: Not met Pt will Sit at [facility] of Bed: with supervision;1-2 min PT Goal: Sit at Edge Of Bed - Progress: Not met Pt will go Sit to Supine/Side: with supervision PT Goal: Sit to Supine/Side - Progress: Not met Pt will go Sit to Stand: with min assist PT Goal: Sit to Stand - Progress: Not met Pt will go Stand to Sit: with min assist PT Goal: Stand to Sit - Progress: Not met Pt will Stand: with min assist;1 - 2 min PT Goal: Stand - Progress: Not met Pt will Ambulate: 16 - 50 feet;with min assist;with rolling walker PT Goal: Ambulate - Progress: Not met  Visit Information  Last PT Received On: 04/10/12 Assistance Needed: +2 PT/OT Co-Evaluation/Treatment: Yes    Subjective Data      Cognition  Overall Cognitive Status: Impaired Area of Impairment: Attention;Following commands;Problem solving;Executive functioning;Rancho level Arousal/Alertness: Lethargic Orientation Level: Person Behavior During Session: Lethargic Current Attention Level: Focused Attention - Other Comments: Difficulty to sustain attention due to lethargy and unable to keep eyes open. Following Commands: Follows one step commands inconsistently    Balance  Balance Balance Assessed: Yes Static Sitting Balance Static Sitting - Balance Support: Feet supported Static Sitting - Level of Assistance: 2: Max assist Static Sitting - Comment/# of Minutes: ~10 minutes prior to transfer; (A) to maintain balance with cues for upright posture and prevent posterior lean.  Pt tends to stay in flexed posture however remains on heels leaning posterior.  End of Session PT - End of  Session Equipment Utilized During Treatment: Gait belt Activity Tolerance: Patient limited by fatigue Patient left: in chair;with call bell/phone within reach;with family/visitor present Nurse Communication: Mobility status    Carle Dargan 04/10/2012, 10:13 AM Jake Shark, PT DPT [phone]

## 2012-04-10 NOTE — Progress Notes (Signed)
Speech Language Pathology Dysphagia Treatment Patient Details Name: Shirley Bond MRN: [id number] DOB: 15-Jan-1924 Today's Date: 04/10/2012 Time: 4098-1191 SLP Time Calculation (min): 23 min  Assessment / Plan / Recommendation Clinical Impression  Treatment focused on facilitation of cognition and sustained attention for PO intake.  Patient lethargic, however SLP and nurse tech repositioned her in bed, used a cold washcloth to stimulate her arousal as well as verbal cues elicited imiproved alertness and verbal responses to SLP's cues.  Patient consumed 4 oz of apple juice via cup sip with delayed coughs 50% of trials.  Due to patient's impaired cognition, her aspiration risk is higher.  Will continue to follow to maximze her cognitive abilites for increased PO intake.    Diet Recommendation  Continue with Current Diet: Dysphagia 1 (puree);Thin liquid    SLP Plan Continue with current plan of care   Pertinent Vitals/Pain n/a   Swallowing Goals  SLP Swallowing Goals Patient will consume recommended diet without observed clinical signs of aspiration with: Supervision/safety Swallow Study Goal #1 - Progress: Not Met Patient will utilize recommended strategies during swallow to increase swallowing safety with: Supervision/safety Swallow Study Goal #2 - Progress: Not met  Dysphagia Treatment Treatment focused on: Skilled observation of diet tolerance;Patient/family/caregiver education Family/Caregiver Educated: Daughter and Husband Treatment Methods/Modalities: Skilled observation Patient observed directly with PO's: Yes Type of PO's observed: Dysphagia 1 (puree);Thin liquids Feeding: Total assist Liquids provided via: Cup Pharyngeal Phase Signs & Symptoms: Delayed cough Type of cueing: Tactile;Verbal Amount of cueing: Maximal   Myra Rude, M.S.,CCC-SLP Pager [phone] 04/10/2012, 2:51 PM

## 2012-04-10 NOTE — Progress Notes (Addendum)
SUBJECTIVE Patient not saying much.   1. Dizziness   2. Fall   3. Frontal lobe contusion   4. Laceration of head     Past Medical History  Diagnosis Date  . COPD (chronic obstructive pulmonary disease)   . Frequent UTI   . Hypertension   . Arthritis   . Osteoarthritis   . Hypercholesteremia   . Asthma   . Thyroid disease   . Environmental allergies   . Osteopenia   . Macular degeneration   . Hearing loss   . Constipation   . Back pain   . Dementia   . PONV (postoperative nausea and vomiting)   . Hypothyroidism   . Blood transfusion     " no reaction to transfusion "   Current Facility-Administered Medications  Medication Dose Route Frequency Provider Last Rate Last Dose  . acetaminophen (TYLENOL) tablet 650 mg  650 mg Oral Q6H PRN Antonieta Pert, MD       Or  . acetaminophen (TYLENOL) suppository 650 mg  650 mg Rectal Q6H PRN Antonieta Pert, MD      . amLODipine (NORVASC) tablet 5 mg  5 mg Oral Daily Aakash Hollomon, MD      . bisacodyl (DULCOLAX) suppository 10 mg  10 mg Rectal Daily PRN Antonieta Pert, MD      . budesonide-formoterol (SYMBICORT) 160-4.5 MCG/ACT inhaler 2 puff  2 puff Inhalation BID Antonieta Pert, MD   2 puff at 04/10/12 0753  . DULoxetine (CYMBALTA) DR capsule 60 mg  60 mg Oral Daily Antonieta Pert, MD   60 mg at 04/09/12 4098  . haloperidol lactate (HALDOL) injection 2 mg  2 mg Intravenous Q6H PRN Antonieta Pert, MD   2 mg at 04/09/12 1944  . hydrALAZINE (APRESOLINE) injection 10 mg  10 mg Intravenous Q8H PRN Antonieta Pert, MD      . levothyroxine (SYNTHROID, LEVOTHROID) tablet 50 mcg  50 mcg Oral Q breakfast Antonieta Pert, MD   50 mcg at 04/09/12 0918  . lidocaine (LIDODERM) 5 % 1 patch  1 patch Transdermal Daily Antonieta Pert, MD   1 patch at 04/09/12 [phone]  . LORazepam (ATIVAN) injection 0.5 mg  0.5 mg Intravenous Once Caroline More, NP   0.5 mg at 04/09/12 2305  . lubiprostone (AMITIZA) capsule 24 mcg  24 mcg Oral BID WC Antonieta Pert, MD   24 mcg at 04/09/12 1821  . magnesium sulfate IVPB 2 g 50 mL  2 g Intravenous Once Danyal Adorno, MD   2 g at 04/09/12 0900  . meclizine (ANTIVERT) tablet 12.5 mg  12.5 mg Oral Q8H PRN Antonieta Pert, MD   12.5 mg at 04/08/12 1838  . methadone (DOLOPHINE) tablet 2.5 mg  2.5 mg Oral Daily Antonieta Pert, MD      . metoprolol succinate (TOPROL-XL) 24 hr tablet 50 mg  50 mg Oral Daily Antonieta Pert, MD   50 mg at 04/09/12 0919  . olopatadine (PATANOL) 0.1 % ophthalmic solution 1 drop  1 drop Both Eyes BID Antonieta Pert, MD   1 drop at 04/09/12 2123  . ondansetron (ZOFRAN) tablet 4 mg  4 mg Oral Q6H PRN Antonieta Pert, MD       Or  . ondansetron [facility]) injection 4 mg  4 mg Intravenous Q6H PRN Antonieta Pert, MD      . potassium chloride 20 MEQ/15ML (10%) liquid 40 mEq  40 mEq Oral Once Conley Canal, MD   40 mEq at 04/09/12 1155  . senna (SENOKOT) tablet 8.6 mg  1 tablet Oral BID PRN Antonieta Pert, MD      . simvastatin (ZOCOR) tablet 20 mg  20 mg Oral q1800 Antonieta Pert, MD   20 mg at 04/09/12 1821  . traMADol (ULTRAM) tablet 50 mg  50 mg Oral Q6H PRN Takeela Peil, MD   50 mg at 04/09/12 2122  . DISCONTD: dextrose 5 %-0.9 % sodium chloride infusion   Intravenous Continuous Antonieta Pert, MD 75 mL/hr at 04/09/12 0410    . DISCONTD: fentaNYL (SUBLIMAZE) injection 12.5 mcg  12.5 mcg Intravenous Q6H PRN Antonieta Pert, MD   12.5 mcg at 04/09/12 0500   Allergies  Allergen Reactions  . Hyomax (Hyoscyamine Sulfate) Other (See Comments)    unknown  . Other     Negative reactions to narcotics/opiates  . Oxycodone Hcl Er Other (See Comments)    unknown  . Sulfa Antibiotics Other (See Comments)    unknown   Principal Problem:  *Cerebral hemorrhage Active Problems:  Fall  Dizziness  HTN (hypertension)  Osteoarthritis  COPD (chronic obstructive pulmonary disease)   Vital signs in last 24 hours: Temp:  [97.5 F (36.4 C)-98.9 F (37.2 C)] 97.8 F (36.6  C) (05/09 0543) Pulse Rate:  [69-89] 74  (05/09 0543) Resp:  [18-20] 19  (05/09 0543) BP: (94-188)/(41-84) 150/80 mmHg (05/09 0553) SpO2:  [95 %-98 %] 98 % (05/09 0753) Weight change:  Last BM Date: 04/07/12  Intake/Output from previous day: 05/08 0701 - 05/09 0700 In: 560 [P.O.:560] Out: -  Intake/Output this shift:    Lab Results:  Basename 04/10/12 0549 04/09/12 0530  WBC 8.8 6.5  HGB 10.8* 10.2*  HCT 30.8* 29.6*  PLT 296 260   BMET  Basename 04/10/12 0549 04/09/12 0530  NA 137 140  K 4.0 3.3*  CL 105 106  CO2 26 26  GLUCOSE 93 122*  BUN 11 7  CREATININE 0.63 0.60  CALCIUM 8.8 8.8    Studies/Results: No results found.  Medications: I have reviewed the patient's current medications.   Physical exam GENERAL- alert HEAD- normal atraumatic, no neck masses, normal thyroid, no jvd. Left eye remains swollen. RESPIRATORY- appears well, vitals normal, no respiratory distress, acyanotic, normal RR, ear and throat exam is normal, neck free of mass or lymphadenopathy, chest clear, no wheezing, crepitations, rhonchi, normal symmetric air entry CVS- regular rate and rhythm, S1, S2 normal, no murmur, click, rub or gallop ABDOMEN- abdomen is soft without significant tenderness, masses, organomegaly or guarding NEURO- somnolent but arousable. Moving all extremities. EXTREMITIES- extremities normal, atraumatic, no cyanosis or edema  Plan  * Cerebral hemorrhage/Fall/ Dizziness- seems stable. Appreciate PT. Continue to Mobilize. Needs less sedation to be able to meaningfully participate in PT activities.  * HTN (hypertension)- uncontrolled. Add norvasc.  * Osteoarthritis/chronic pain. Now on ultram,. * COPD (chronic obstructive pulmonary disease)- stable.  * hypokalemia- likely related to poor oral intake. Replenished. Patient now feeding.  * disposition- For snf when bed available.     Leighton Luster 04/10/2012 8:50 AM Pager: [phone].   I had extensive discussion with  Mrs Bernales daughter in the presence of her husband. She is still not sure why her mom was complaining of headache and dizziness for 2-3 weeks. She says her mom gets easily delirious with UTIs, ativan. Patient was restarted on methadone about a week ago. After discussions with patient's daughter,  I will obtain vitamin b12 level, tsh, UA/UC/2decho, carotid duplex, repeat ct brain, as patient continues to be out of it, far from her baseline. I will also hold methadone to try to get her baseline sensorium of sedatives. Would discourage giving benzos, narcotics in the night. If agitated try haldol as ordered, so we can have a better assessment of her sensorium. Noted UA results. Added levaquin. Ordered cxr.  Aleighya Mcanelly,Md pagerpager#[phone].

## 2012-04-11 DIAGNOSIS — I619 Nontraumatic intracerebral hemorrhage, unspecified: Secondary | ICD-10-CM

## 2012-04-11 DIAGNOSIS — E782 Mixed hyperlipidemia: Secondary | ICD-10-CM

## 2012-04-11 DIAGNOSIS — E119 Type 2 diabetes mellitus without complications: Secondary | ICD-10-CM

## 2012-04-11 DIAGNOSIS — R42 Dizziness and giddiness: Secondary | ICD-10-CM

## 2012-04-11 DIAGNOSIS — I517 Cardiomegaly: Secondary | ICD-10-CM

## 2012-04-11 LAB — URINE CULTURE
Colony Count: >=100000
Culture  Setup Time: 201305091656

## 2012-04-11 LAB — GLUCOSE, CAPILLARY: Glucose-Capillary: 85 mg/dL (ref 70–99)

## 2012-04-11 MED ORDER — LORAZEPAM 2 MG/ML IJ SOLN
0.5000 mg | INTRAMUSCULAR | Status: AC | PRN
Start: 2012-04-11 — End: 2012-04-11
  Administered 2012-04-11 (×2): 0.5 mg via INTRAVENOUS
  Filled 2012-04-11: qty 1

## 2012-04-11 MED ORDER — LORAZEPAM 2 MG/ML IJ SOLN
INTRAMUSCULAR | Status: AC
  Filled 2012-04-11: qty 1

## 2012-04-11 MED ORDER — AMLODIPINE BESYLATE 10 MG PO TABS
10.0000 mg | ORAL_TABLET | Freq: Every day | ORAL | Status: DC
  Administered 2012-04-12 – 2012-04-13 (×2): 10 mg via ORAL
  Filled 2012-04-11 (×2): qty 1

## 2012-04-11 NOTE — Progress Notes (Signed)
Shirley Bond is a 78 y.o. female from [facility], admitted a fall preceded by headache and dizziness, and was found to have intracerebral/ small subdural hemorrhage. She also has UTI. She was seen by NS/Trauma surgery, both of whom have since signed off. I repeated Ct brain yesterday, which showed "5 mm hemorrhagic contusion left frontal lobe again noted.  Possible small left frontal subdural hematoma without midline shift". She was started on levaquin for UTI yesterday. She awaits 2decho result but carotid duplex ok. Her daughter wanted the dizziness investigated. Unfortunately, patient has required intermittent sedation for agitation, since admission, and has not been able to have a meaningful conversation with me.   1. Dizziness   2. Fall   3. Frontal lobe contusion   4. Laceration of head   5. COPD (chronic obstructive pulmonary disease)     Past Medical History  Diagnosis Date  . COPD (chronic obstructive pulmonary disease)   . Frequent UTI   . Hypertension   . Arthritis   . Osteoarthritis   . Hypercholesteremia   . Asthma   . Thyroid disease   . Environmental allergies   . Osteopenia   . Macular degeneration   . Hearing loss   . Constipation   . Back pain   . Dementia   . PONV (postoperative nausea and vomiting)   . Hypothyroidism   . Blood transfusion     " no reaction to transfusion "   Current Facility-Administered Medications  Medication Dose Route Frequency Provider Last Rate Last Dose  . acetaminophen (TYLENOL) tablet 650 mg  650 mg Oral Q6H PRN Antonieta Pert, MD       Or  . acetaminophen (TYLENOL) suppository 650 mg  650 mg Rectal Q6H PRN Antonieta Pert, MD      . amLODipine (NORVASC) tablet 5 mg  5 mg Oral Daily Emilee Market, MD   5 mg at 04/11/12 1004  . bisacodyl (DULCOLAX) suppository 10 mg  10 mg Rectal Daily PRN Antonieta Pert, MD      . budesonide-formoterol (SYMBICORT) 160-4.5 MCG/ACT inhaler 2 puff  2 puff Inhalation BID Antonieta Pert, MD   2 puff at 04/10/12 2254  . DULoxetine (CYMBALTA) DR capsule 60 mg  60 mg Oral Daily Antonieta Pert, MD   60 mg at 04/11/12 1003  . haloperidol lactate (HALDOL) injection 2 mg  2 mg Intravenous Q6H PRN Antonieta Pert, MD   2 mg at 04/11/12 0537  . hydrALAZINE (APRESOLINE) injection 10 mg  10 mg Intravenous Q8H PRN Antonieta Pert, MD      . levofloxacin (LEVAQUIN) IVPB 500 mg  500 mg Intravenous Q48H Amareon Phung, MD   500 mg at 04/10/12 2037  . levothyroxine (SYNTHROID, LEVOTHROID) tablet 50 mcg  50 mcg Oral Q breakfast Antonieta Pert, MD   50 mcg at 04/11/12 1004  . lidocaine (LIDODERM) 5 % 1 patch  1 patch Transdermal Daily Antonieta Pert, MD   1 patch at 04/11/12 1003  . LORazepam (ATIVAN) 2 MG/ML injection           . LORazepam (ATIVAN) injection 0.5 mg  0.5 mg Intravenous Q4H PRN Caroline More, NP   0.5 mg at 04/11/12 1003  . lubiprostone (AMITIZA) capsule 24 mcg  24 mcg Oral BID WC Antonieta Pert, MD   24 mcg at 04/11/12 0858  . meclizine (ANTIVERT) tablet 12.5 mg  12.5 mg Oral Q8H PRN Sosan J  Cleotis Lema, MD   12.5 mg at 04/08/12 1838  . metoprolol succinate (TOPROL-XL) 24 hr tablet 50 mg  50 mg Oral Daily Antonieta Pert, MD   50 mg at 04/11/12 1004  . olopatadine (PATANOL) 0.1 % ophthalmic solution 1 drop  1 drop Both Eyes BID Antonieta Pert, MD   1 drop at 04/10/12 2216  . ondansetron (ZOFRAN) tablet 4 mg  4 mg Oral Q6H PRN Antonieta Pert, MD       Or  . ondansetron [facility]) injection 4 mg  4 mg Intravenous Q6H PRN Antonieta Pert, MD      . senna (SENOKOT) tablet 8.6 mg  1 tablet Oral BID PRN Antonieta Pert, MD      . simvastatin (ZOCOR) tablet 20 mg  20 mg Oral q1800 Antonieta Pert, MD   20 mg at 04/10/12 1731  . traMADol (ULTRAM) tablet 50 mg  50 mg Oral Q6H PRN Rubee Vega, MD   50 mg at 04/10/12 1731  . DISCONTD: levofloxacin (LEVAQUIN) IVPB 750 mg  750 mg Intravenous Q24H Rey Dansby, MD      . DISCONTD: methadone (DOLOPHINE) tablet 2.5 mg   2.5 mg Oral Daily Antonieta Pert, MD   2.5 mg at 04/10/12 1015   Allergies  Allergen Reactions  . Hyomax (Hyoscyamine Sulfate) Other (See Comments)    unknown  . Other     Negative reactions to narcotics/opiates  . Oxycodone Hcl Er Other (See Comments)    unknown  . Sulfa Antibiotics Other (See Comments)    unknown   Principal Problem:  *Cerebral hemorrhage Active Problems:  Fall  Dizziness  HTN (hypertension)  Osteoarthritis  COPD (chronic obstructive pulmonary disease)   Vital signs in last 24 hours: Temp:  [97.4 F (36.3 C)-99.1 F (37.3 C)] 99.1 F (37.3 C) (05/10 0600) Pulse Rate:  [68-79] 79  (05/10 0600) Resp:  [16-18] 18  (05/10 0600) BP: (135-181)/(63-81) 169/79 mmHg (05/10 0600) SpO2:  [93 %-98 %] 93 % (05/10 0600) Weight change:  Last BM Date: 04/07/12  Intake/Output from previous day: 05/09 0701 - 05/10 0700 In: 400 [P.O.:300; IV Piggyback:100] Out: -  Intake/Output this shift:    Lab Results:  Basename 04/10/12 0549 04/09/12 0530  WBC 8.8 6.5  HGB 10.8* 10.2*  HCT 30.8* 29.6*  PLT 296 260   BMET  Basename 04/10/12 0549 04/09/12 0530  NA 137 140  K 4.0 3.3*  CL 105 106  CO2 26 26  GLUCOSE 93 122*  BUN 11 7  CREATININE 0.63 0.60  CALCIUM 8.8 8.8    Studies/Results: Ct Head Wo Contrast  04/10/2012  *RADIOLOGY REPORT*  Clinical Data: Fall.  Follow up intracranial hemorrhage  CT HEAD WITHOUT CONTRAST  Technique:  Contiguous axial images were obtained from the base of the skull through the vertex without contrast.  Comparison: 04/07/2012  Findings: Small hyperdensity in the left anterior medial frontal lobe is unchanged compatible with a small area of hemorrhagic contusion measuring approximate 5 mm.  Small intermediate density subdural fluid collection on the left has developed which may be due to a small subdural hygroma containing blood.  No mass effect or midline shift.  No subarachnoid or ventricular hemorrhage.  Ventricle size is normal.   Mild chronic microvascular ischemia in the white matter.  Negative for skull fracture.  IMPRESSION: 5 mm hemorrhagic contusion left frontal lobe again noted.  Possible small left frontal subdural hematoma without midline shift.  Original Report Authenticated By:  Camelia Phenes, M.D.   Dg Chest Port 1 View  04/10/2012  *RADIOLOGY REPORT*  Clinical Data: Altered mental status.  Pneumonia.  PORTABLE CHEST - 1 VIEW  Comparison: 02/29/2012.  Findings: Low volume chest.  Basilar atelectasis.  Thoracolumbar fixation hardware.  Low volumes accentuate the size of the cardiopericardial silhouette which is probably unchanged.Tortuous thoracic aorta.  IMPRESSION: Low volume chest.  Original Report Authenticated By: Andreas Newport, M.D.    Medications: I have reviewed the patient's current medications.   Physical exam GENERAL- alert HEAD- normal atraumatic, no neck masses, normal thyroid, no jvd RESPIRATORY- appears well, vitals normal, no respiratory distress, acyanotic, normal RR, ear and throat exam is normal, neck free of mass or lymphadenopathy, chest clear, no wheezing, crepitations, rhonchi, normal symmetric air entry CVS- regular rate and rhythm, S1, S2 normal, no murmur, click, rub or gallop ABDOMEN- abdomen is soft without significant tenderness, masses, organomegaly or guarding NEURO- sedated but arousable. Moving all extremities. EXTREMITIES- extremities normal, atraumatic, no cyanosis or edema  Plan  * Cerebral hemorrhage(ICH due to contution/subdural)/Fall/ Dizziness- seems stable. Appreciate PT. Continue to Mobilize. Needs less sedation to be able to meaningfully participate in PT activities.  * HTN (hypertension)- Fluctuating control. Increase norvasc.  * Osteoarthritis/chronic pain. Now on ultram,.  * COPD (chronic obstructive pulmonary disease)- stable * UTI- on levaquin per pharmacy. Appreciate input. To complete 7 days. Follow urine culture.  * hypokalemia- likely related to poor oral  intake. Replenished. Patient now feeding.  * disposition- For snf when bed available, hopefully monday.         Shaneika Rossa 04/11/2012 10:45 AM Pager: [phone].

## 2012-04-11 NOTE — Progress Notes (Signed)
  Echocardiogram 2D Echocardiogram has been performed.  Cyle Kenyon, Real Cons 04/11/2012, 10:26 AM

## 2012-04-11 NOTE — Care Management Note (Signed)
    Page 1 of 1   04/16/2012     9:26:06 AM   CARE MANAGEMENT NOTE 04/16/2012  Patient:  Shirley Bond, Shirley Bond   Account Number:  [id number]  Date Initiated:  04/08/2012  Documentation initiated by:  Darlyne Russian  Subjective/Objective Assessment:   Patient admitted with cerebral hemorrhage s/p fall.     Action/Plan:   Progression of care and discharge planning   Anticipated DC Date:  04/11/2012   Anticipated DC Plan:  SKILLED NURSING FACILITY  In-house referral  Clinical Social Worker      DC Planning Services  CM consult      Choice offered to / List presented to:             Status of service:  Completed, signed off Medicare Important Message given?   (If response is "NO", the following Medicare IM given date fields will be blank) Date Medicare IM given:   Date Additional Medicare IM given:    Discharge Disposition:  SKILLED NURSING FACILITY  Per UR Regulation:  Reviewed for med. necessity/level of care/duration of stay  If discussed at Long Length of Stay Meetings, dates discussed:    Comments:  04/15/12 Onnie Boer, RN, BSN [phone] PT WAS DC'D TO [facility]  04/11/12 Onnie Boer, RN, BSN [id number] PT CONTINUES WITH CONFUSION AND NOW HAS A UTI.  PT WILL NEED PT/OT EVAL AND SHOULD DC BACK TO [facility] ON [place]. WILL F/U ON NEEDS  04/07/2012 0930 Darlyne Russian RN, CCM Utilization review completed.  Patient admitted from [facility].

## 2012-04-11 NOTE — Progress Notes (Signed)
*  PRELIMINARY RESULTS* Vascular Ultrasound Carotid Duplex (Doppler) has been completed.  Preliminary findings: Bilaterally no evidence of ICA stenosis with antegrade vertebral flow.  Farrel Demark, RDMS 04/11/2012, 10:24 AM

## 2012-04-12 DIAGNOSIS — E782 Mixed hyperlipidemia: Secondary | ICD-10-CM

## 2012-04-12 DIAGNOSIS — I619 Nontraumatic intracerebral hemorrhage, unspecified: Secondary | ICD-10-CM

## 2012-04-12 DIAGNOSIS — R42 Dizziness and giddiness: Secondary | ICD-10-CM

## 2012-04-12 DIAGNOSIS — E119 Type 2 diabetes mellitus without complications: Secondary | ICD-10-CM

## 2012-04-12 LAB — COMPREHENSIVE METABOLIC PANEL
AST: 13 U/L (ref 0–37)
Albumin: 2.9 g/dL — ABNORMAL LOW (ref 3.5–5.2)
Alkaline Phosphatase: 56 U/L (ref 39–117)
Chloride: 99 mEq/L (ref 96–112)
Creatinine, Ser: 0.75 mg/dL (ref 0.50–1.10)
Potassium: 3.3 mEq/L — ABNORMAL LOW (ref 3.5–5.1)
Sodium: 137 mEq/L (ref 135–145)
Total Bilirubin: 0.4 mg/dL (ref 0.3–1.2)

## 2012-04-12 LAB — CBC
Platelets: 354 10*3/uL (ref 150–400)
RDW: 12.4 % (ref 11.5–15.5)
WBC: 10 10*3/uL (ref 4.0–10.5)

## 2012-04-12 MED ORDER — LEVOFLOXACIN 500 MG PO TABS
500.0000 mg | ORAL_TABLET | ORAL | Status: DC
  Administered 2012-04-12 – 2012-04-14 (×2): 500 mg via ORAL
  Filled 2012-04-12 (×3): qty 1

## 2012-04-12 MED ORDER — POTASSIUM CHLORIDE 10 MEQ/100ML IV SOLN
10.0000 meq | INTRAVENOUS | Status: AC
  Administered 2012-04-13 (×3): 10 meq via INTRAVENOUS
  Filled 2012-04-12 (×3): qty 100

## 2012-04-12 NOTE — Progress Notes (Signed)
Shirley Bond is a 78 y.o. female from [facility], admitted a fall preceded by headache and dizziness, and was found to have intracerebral/ small subdural hemorrhage. She also has UTI. She was seen by NS/Trauma surgery, both of whom have since signed off. I repeated Ct brain yesterday, which showed "5 mm hemorrhagic contusion left frontal lobe again noted.  Possible small left frontal subdural hematoma without midline shift". She was started on levaquin for UTI 5/9. Her carotid duplex ok.   Subjective -pt awake, disoriented. Denies any c/o  1. Dizziness   2. Fall   3. Frontal lobe contusion   4. Laceration of head   5. COPD (chronic obstructive pulmonary disease)     Past Medical History  Diagnosis Date  . COPD (chronic obstructive pulmonary disease)   . Frequent UTI   . Hypertension   . Arthritis   . Osteoarthritis   . Hypercholesteremia   . Asthma   . Thyroid disease   . Environmental allergies   . Osteopenia   . Macular degeneration   . Hearing loss   . Constipation   . Back pain   . Dementia   . PONV (postoperative nausea and vomiting)   . Hypothyroidism   . Blood transfusion     " no reaction to transfusion "   Current Facility-Administered Medications  Medication Dose Route Frequency Provider Last Rate Last Dose  . acetaminophen (TYLENOL) tablet 650 mg  650 mg Oral Q6H PRN Antonieta Pert, MD       Or  . acetaminophen (TYLENOL) suppository 650 mg  650 mg Rectal Q6H PRN Antonieta Pert, MD      . amLODipine (NORVASC) tablet 10 mg  10 mg Oral Daily Simbiso Ranga, MD   10 mg at 04/12/12 1005  . bisacodyl (DULCOLAX) suppository 10 mg  10 mg Rectal Daily PRN Antonieta Pert, MD      . budesonide-formoterol (SYMBICORT) 160-4.5 MCG/ACT inhaler 2 puff  2 puff Inhalation BID Antonieta Pert, MD   2 puff at 04/12/12 0840  . DULoxetine (CYMBALTA) DR capsule 60 mg  60 mg Oral Daily Antonieta Pert, MD   60 mg at 04/12/12 1006  . haloperidol lactate  (HALDOL) injection 2 mg  2 mg Intravenous Q6H PRN Antonieta Pert, MD   2 mg at 04/11/12 0537  . hydrALAZINE (APRESOLINE) injection 10 mg  10 mg Intravenous Q8H PRN Antonieta Pert, MD   10 mg at 04/12/12 0644  . levofloxacin (LEVAQUIN) IVPB 500 mg  500 mg Intravenous Q48H Simbiso Ranga, MD   500 mg at 04/10/12 2037  . levothyroxine (SYNTHROID, LEVOTHROID) tablet 50 mcg  50 mcg Oral Q breakfast Antonieta Pert, MD   50 mcg at 04/12/12 0823  . lidocaine (LIDODERM) 5 % 1 patch  1 patch Transdermal Daily Antonieta Pert, MD   1 patch at 04/12/12 1006  . lubiprostone (AMITIZA) capsule 24 mcg  24 mcg Oral BID WC Antonieta Pert, MD   24 mcg at 04/12/12 1610  . meclizine (ANTIVERT) tablet 12.5 mg  12.5 mg Oral Q8H PRN Antonieta Pert, MD   12.5 mg at 04/08/12 1838  . metoprolol succinate (TOPROL-XL) 24 hr tablet 50 mg  50 mg Oral Daily Antonieta Pert, MD   50 mg at 04/12/12 1006  . olopatadine (PATANOL) 0.1 % ophthalmic solution 1 drop  1 drop Both Eyes BID Antonieta Pert, MD   1 drop at 04/12/12  1006  . ondansetron (ZOFRAN) tablet 4 mg  4 mg Oral Q6H PRN Antonieta Pert, MD       Or  . ondansetron [facility]) injection 4 mg  4 mg Intravenous Q6H PRN Antonieta Pert, MD      . senna (SENOKOT) tablet 8.6 mg  1 tablet Oral BID PRN Antonieta Pert, MD      . simvastatin (ZOCOR) tablet 20 mg  20 mg Oral q1800 Antonieta Pert, MD   20 mg at 04/11/12 1750  . traMADol (ULTRAM) tablet 50 mg  50 mg Oral Q6H PRN Simbiso Ranga, MD   50 mg at 04/10/12 1731  . DISCONTD: amLODipine (NORVASC) tablet 5 mg  5 mg Oral Daily Simbiso Ranga, MD   5 mg at 04/11/12 1004   Allergies  Allergen Reactions  . Hyomax (Hyoscyamine Sulfate) Other (See Comments)    unknown  . Other     Negative reactions to narcotics/opiates  . Oxycodone Hcl Er Other (See Comments)    unknown  . Sulfa Antibiotics Other (See Comments)    unknown   Principal Problem:  *Cerebral hemorrhage Active Problems:  Fall  Dizziness  HTN  (hypertension)  Osteoarthritis  COPD (chronic obstructive pulmonary disease)   Vital signs in last 24 hours: Temp:  [97.5 F (36.4 C)-98.7 F (37.1 C)] 98.1 F (36.7 C) (05/11 0921) Pulse Rate:  [67-87] 87  (05/11 0921) Resp:  [17-20] 19  (05/11 0921) BP: (110-180)/(50-87) 110/50 mmHg (05/11 0921) SpO2:  [92 %-100 %] 97 % (05/11 0841) Weight change:  Last BM Date: 04/07/12  Intake/Output from previous day:   Intake/Output this shift: Total I/O In: 20 [P.O.:20] Out: -   Lab Results:  [facility] 04/12/12 0720 04/10/12 0549  WBC 10.0 8.8  HGB 10.8* 10.8*  HCT 30.8* 30.8*  PLT 354 296   BMET  Basename 04/12/12 0720 04/10/12 0549  NA 137 137  K 3.3* 4.0  CL 99 105  CO2 28 26  GLUCOSE 79 93  BUN 18 11  CREATININE 0.75 0.63  CALCIUM 9.2 8.8    Studies/Results: Ct Head Wo Contrast  04/10/2012  *RADIOLOGY REPORT*  Clinical Data: Fall.  Follow up intracranial hemorrhage  CT HEAD WITHOUT CONTRAST  Technique:  Contiguous axial images were obtained from the base of the skull through the vertex without contrast.  Comparison: 04/07/2012  Findings: Small hyperdensity in the left anterior medial frontal lobe is unchanged compatible with a small area of hemorrhagic contusion measuring approximate 5 mm.  Small intermediate density subdural fluid collection on the left has developed which may be due to a small subdural hygroma containing blood.  No mass effect or midline shift.  No subarachnoid or ventricular hemorrhage.  Ventricle size is normal.  Mild chronic microvascular ischemia in the white matter.  Negative for skull fracture.  IMPRESSION: 5 mm hemorrhagic contusion left frontal lobe again noted.  Possible small left frontal subdural hematoma without midline shift.  Original Report Authenticated By: Camelia Phenes, M.D.   Dg Chest Port 1 View  04/10/2012  *RADIOLOGY REPORT*  Clinical Data: Altered mental status.  Pneumonia.  PORTABLE CHEST - 1 VIEW  Comparison: 02/29/2012.  Findings:  Low volume chest.  Basilar atelectasis.  Thoracolumbar fixation hardware.  Low volumes accentuate the size of the cardiopericardial silhouette which is probably unchanged.Tortuous thoracic aorta.  IMPRESSION: Low volume chest.  Original Report Authenticated By: Andreas Newport, M.D.   ECHO Study Conclusions  Left ventricle: The cavity size was normal.  Wall thickness was increased in a pattern of mild LVH. Systolic function was vigorous. The estimated ejection fraction was in the range of 65% to 70%. There was an increased relative contribution of atrial contraction to ventricular filling.   Medications: I have reviewed the patient's current medications.   Physical exam GENERAL- alert HEAD- normal atraumatic, no neck masses, normal thyroid, no jvd RESPIRATORY- chest clear, no wheezing, crepitations, rhonchi, normal symmetric air entry CVS- regular rate and rhythm, S1, S2 normal, no murmur, click, rub or gallop ABDOMEN- abdomen is soft without significant tenderness, masses, organomegaly or guarding NEURO- sedated but arousable. Moving all extremities. EXTREMITIES- extremities normal, atraumatic, no cyanosis or edema  Plan  * Cerebral hemorrhage(ICH due to consution/subdural)/Fall/ Dizziness- seems stable.  -Continue to Mobilize, per PT. -Echo with EF 65-70% no embolic source reported * HTN (hypertension)- BP control improving on Increased norvasc.  * Osteoarthritis/chronic pain. continue on ultram,.  * COPD (chronic obstructive pulmonary disease)- stable * UTI/viridans strep- on levaquinTo complete 7 days. Follow sensitivities.  * hypokalemia- likely related to poor oral intake. Replete. .   * disposition- For snf when bed available, hopefully monday.         Berry Gallacher C 04/12/2012 10:46 AM Pager: [phone].

## 2012-04-13 DIAGNOSIS — E119 Type 2 diabetes mellitus without complications: Secondary | ICD-10-CM

## 2012-04-13 DIAGNOSIS — R42 Dizziness and giddiness: Secondary | ICD-10-CM

## 2012-04-13 DIAGNOSIS — I619 Nontraumatic intracerebral hemorrhage, unspecified: Secondary | ICD-10-CM

## 2012-04-13 DIAGNOSIS — E782 Mixed hyperlipidemia: Secondary | ICD-10-CM

## 2012-04-13 LAB — BASIC METABOLIC PANEL
BUN: 18 mg/dL (ref 6–23)
GFR calc non Af Amer: 80 mL/min — ABNORMAL LOW (ref >90)
Glucose, Bld: 96 mg/dL (ref 70–99)
Potassium: 3.8 mEq/L (ref 3.5–5.1)

## 2012-04-13 MED ORDER — AMLODIPINE BESYLATE 10 MG PO TABS
10.0000 mg | ORAL_TABLET | Freq: Every day | ORAL | Status: DC
  Administered 2012-04-14 – 2012-04-15 (×2): 10 mg via ORAL
  Filled 2012-04-13 (×2): qty 1

## 2012-04-13 NOTE — Progress Notes (Signed)
Shirley Bond is a 78 y.o. female from [facility], admitted a fall preceded by headache and dizziness, and was found to have intracerebral/ small subdural hemorrhage. She also has UTI. She was seen by NS/Trauma surgery, both of whom have since signed off. I repeated Ct brain yesterday, which showed "5 mm hemorrhagic contusion left frontal lobe again noted.  Possible small left frontal subdural hematoma without midline shift". She was started on levaquin for UTI 5/9. Her carotid duplex ok, and echo came back with estimated ejection fraction was in the range of 65% to 70%.   Subjective -Pt remains disoriented, alert, denies any pain  1. Dizziness   2. Fall   3. Frontal lobe contusion   4. Laceration of head   5. COPD (chronic obstructive pulmonary disease)     Past Medical History  Diagnosis Date  . COPD (chronic obstructive pulmonary disease)   . Frequent UTI   . Hypertension   . Arthritis   . Osteoarthritis   . Hypercholesteremia   . Asthma   . Thyroid disease   . Environmental allergies   . Osteopenia   . Macular degeneration   . Hearing loss   . Constipation   . Back pain   . Dementia   . PONV (postoperative nausea and vomiting)   . Hypothyroidism   . Blood transfusion     " no reaction to transfusion "   Current Facility-Administered Medications  Medication Dose Route Frequency Provider Last Rate Last Dose  . acetaminophen (TYLENOL) tablet 650 mg  650 mg Oral Q6H PRN Antonieta Pert, MD       Or  . acetaminophen (TYLENOL) suppository 650 mg  650 mg Rectal Q6H PRN Antonieta Pert, MD      . amLODipine (NORVASC) tablet 10 mg  10 mg Oral Daily Simbiso Ranga, MD   10 mg at 04/13/12 0950  . bisacodyl (DULCOLAX) suppository 10 mg  10 mg Rectal Daily PRN Antonieta Pert, MD      . budesonide-formoterol (SYMBICORT) 160-4.5 MCG/ACT inhaler 2 puff  2 puff Inhalation BID Antonieta Pert, MD   2 puff at 04/13/12 0854  . DULoxetine (CYMBALTA) DR capsule 60 mg   60 mg Oral Daily Antonieta Pert, MD   60 mg at 04/13/12 0950  . haloperidol lactate (HALDOL) injection 2 mg  2 mg Intravenous Q6H PRN Antonieta Pert, MD   2 mg at 04/11/12 0537  . hydrALAZINE (APRESOLINE) injection 10 mg  10 mg Intravenous Q8H PRN Antonieta Pert, MD   10 mg at 04/13/12 0548  . levofloxacin (LEVAQUIN) tablet 500 mg  500 mg Oral Q48H Christella Hartigan, PHARMD   500 mg at 04/12/12 2037  . levothyroxine (SYNTHROID, LEVOTHROID) tablet 50 mcg  50 mcg Oral Q breakfast Antonieta Pert, MD   50 mcg at 04/13/12 0751  . lidocaine (LIDODERM) 5 % 1 patch  1 patch Transdermal Daily Antonieta Pert, MD   1 patch at 04/13/12 0950  . lubiprostone (AMITIZA) capsule 24 mcg  24 mcg Oral BID WC Antonieta Pert, MD   24 mcg at 04/13/12 0751  . meclizine (ANTIVERT) tablet 12.5 mg  12.5 mg Oral Q8H PRN Antonieta Pert, MD   12.5 mg at 04/08/12 1838  . metoprolol succinate (TOPROL-XL) 24 hr tablet 50 mg  50 mg Oral Daily Antonieta Pert, MD   50 mg at 04/13/12 0950  . olopatadine (PATANOL) 0.1 % ophthalmic solution  1 drop  1 drop Both Eyes BID Antonieta Pert, MD   1 drop at 04/13/12 1002  . ondansetron (ZOFRAN) tablet 4 mg  4 mg Oral Q6H PRN Antonieta Pert, MD       Or  . ondansetron [facility]) injection 4 mg  4 mg Intravenous Q6H PRN Antonieta Pert, MD      . potassium chloride 10 mEq in 100 mL IVPB  10 mEq Intravenous Q1 Hr x 3 [employer] C Izaan Kingbird, MD   10 mEq at 04/13/12 0306  . senna (SENOKOT) tablet 8.6 mg  1 tablet Oral BID PRN Antonieta Pert, MD      . simvastatin (ZOCOR) tablet 20 mg  20 mg Oral q1800 Antonieta Pert, MD   20 mg at 04/12/12 1717  . traMADol (ULTRAM) tablet 50 mg  50 mg Oral Q6H PRN Simbiso Ranga, MD   50 mg at 04/13/12 0951  . DISCONTD: levofloxacin (LEVAQUIN) IVPB 500 mg  500 mg Intravenous Q48H Simbiso Ranga, MD   500 mg at 04/10/12 2037   Allergies  Allergen Reactions  . Hyomax (Hyoscyamine Sulfate) Other (See Comments)    unknown  . Other     Negative reactions  to narcotics/opiates  . Oxycodone Hcl Er Other (See Comments)    unknown  . Sulfa Antibiotics Other (See Comments)    unknown   Principal Problem:  *Cerebral hemorrhage Active Problems:  Fall  Dizziness  HTN (hypertension)  Osteoarthritis  COPD (chronic obstructive pulmonary disease)   Vital signs in last 24 hours: Temp:  [98 F (36.7 C)-98.8 F (37.1 C)] 98 F (36.7 C) (05/12 1000) Pulse Rate:  [60-81] 81  (05/12 1000) Resp:  [18-19] 18  (05/12 1000) BP: (92-176)/(52-80) 92/52 mmHg (05/12 1000) SpO2:  [92 %-99 %] 93 % (05/12 1000) Weight change:  Last BM Date: 04/07/12  Intake/Output from previous day: 05/11 0701 - 05/12 0700 In: 140 [P.O.:140] Out: -  Intake/Output this shift:    Lab Results:  Basename 04/12/12 0720  WBC 10.0  HGB 10.8*  HCT 30.8*  PLT 354   BMET  Basename 04/13/12 0555 04/12/12 0720  NA 134* 137  K 3.8 3.3*  CL 98 99  CO2 24 28  GLUCOSE 96 79  BUN 18 18  CREATININE 0.63 0.75  CALCIUM 9.2 9.2    Studies/Results: No results found. ECHO Study Conclusions  Left ventricle: The cavity size was normal. Wall thickness was increased in a pattern of mild LVH. Systolic function was vigorous. The estimated ejection fraction was in the range of 65% to 70%. There was an increased relative contribution of atrial contraction to ventricular filling.   Medications: I have reviewed the patient's current medications.   Physical exam GENERAL- alert, L.forehead laceration with sutures HEAD- normal atraumatic, no neck masses, normal thyroid, no jvd RESPIRATORY- chest clear, no wheezing, crepitations, rhonchi, normal symmetric air entry CVS- regular rate and rhythm, S1, S2 normal, no murmur, click, rub or gallop ABDOMEN- abdomen is soft without significant tenderness, masses, organomegaly or guarding NEURO- alert, disoriented. Moving all extremities. EXTREMITIES- extremities normal, atraumatic, no cyanosis or edema, hands with mittens  Plan   * Cerebral hemorrhage(ICH due to consution/subdural)/Fall/ Dizziness- seems stable.  - PT to follow. -Echo with EF 65-70% no embolic source reported * HTN (hypertension)- low BP this am- hold parameters added to norvasc, monitor closely, if recurrent will need to have norvasc dose decreased back  * Osteoarthritis/chronic pain. continue on ultram,.  * COPD (  chronic obstructive pulmonary disease)- stable * UTI/viridans strep- on levaquinTo complete 7 days. Follow sensitivities.  * hypokalemia- resolved, likely related to poor oral intake.   * disposition- For snf when bed available.     Takasha Vetere C 04/13/2012 2:20 PM Pager: [phone].

## 2012-04-14 DIAGNOSIS — R42 Dizziness and giddiness: Secondary | ICD-10-CM

## 2012-04-14 DIAGNOSIS — I619 Nontraumatic intracerebral hemorrhage, unspecified: Secondary | ICD-10-CM

## 2012-04-14 DIAGNOSIS — E119 Type 2 diabetes mellitus without complications: Secondary | ICD-10-CM

## 2012-04-14 DIAGNOSIS — E782 Mixed hyperlipidemia: Secondary | ICD-10-CM

## 2012-04-14 MED ORDER — HALOPERIDOL 0.5 MG PO TABS
0.5000 mg | ORAL_TABLET | Freq: Once | ORAL | Status: AC
  Administered 2012-04-15: 0.5 mg via ORAL
  Filled 2012-04-14: qty 1

## 2012-04-14 NOTE — Progress Notes (Signed)
Physical Therapy Treatment Patient Details Name: Shirley Bond MRN: [id number] DOB: 05-14-1924 Today's Date: 04/14/2012 Time: 1308-6578 PT Time Calculation (min): 26 min  PT Assessment / Plan / Recommendation Comments on Treatment Session  Pt more alert this session and able to ambulate using RW with max cues.  Pt presenting between Rancho V and VI at this time slowly making progress.    Follow Up Recommendations  Skilled nursing facility    Barriers to Discharge        Equipment Recommendations  Defer to next venue    Recommendations for Other Services    Frequency Min 3X/week   Plan Discharge plan remains appropriate;Frequency remains appropriate    Precautions / Restrictions Precautions Precautions: Fall   Pertinent Vitals/Pain     Mobility  Bed Mobility Bed Mobility: Supine to Sit;Sitting - Scoot to Edge of Bed Supine to Sit: 3: Mod assist Sitting - Scoot to Edge of Bed: 2: Max assist Details for Bed Mobility Assistance: (A) to elevate shoulders OOB and use of pad to assist with scooting Transfers Transfers: Sit to Stand;Stand to Sit;Stand Pivot Transfers Sit to Stand: 3: Mod assist;From bed;From chair/3-in-1 Stand to Sit: 3: Mod assist;To chair/3-in-1;With armrests Stand Pivot Transfers: 3: Mod assist;From elevated surface Details for Transfer Assistance: (A) to initiate transfer with max cues for technique. (A) to advance LE toward recliner. Ambulation/Gait Ambulation/Gait Assistance: 1: +2 Total assist Ambulation/Gait: Patient Percentage: 70% Ambulation Distance (Feet): 100 Feet Assistive device: Rolling walker Ambulation/Gait Assistance Details: +2 for safety to maintain RW and bring recliner behind pt.   Max tactile and manual cues for upright posture and proper RW placement Gait Pattern: Decreased step length - right;Decreased step length - left;Shuffle;Trunk flexed    Exercises     PT Diagnosis:    PT Problem List:   PT Treatment Interventions:      PT Goals Acute Rehab PT Goals PT Goal Formulation: With patient Time For Goal Achievement: 04/23/12 Potential to Achieve Goals: Good Pt will go Supine/Side to Sit: with supervision PT Goal: Supine/Side to Sit - Progress: Progressing toward goal Pt will Sit at Edge of Bed: with supervision;1-2 min PT Goal: Sit at Edge Of Bed - Progress: Progressing toward goal Pt will go Sit to Supine/Side: with supervision PT Goal: Sit to Supine/Side - Progress: Progressing toward goal Pt will go Sit to Stand: with min assist PT Goal: Sit to Stand - Progress: Progressing toward goal Pt will go Stand to Sit: with min assist PT Goal: Stand to Sit - Progress: Progressing toward goal Pt will Stand: with min assist;1 - 2 min PT Goal: Stand - Progress: Progressing toward goal Pt will Ambulate: 16 - 50 feet;with min assist;with rolling walker PT Goal: Ambulate - Progress: Progressing toward goal  Visit Information  Last PT Received On: 04/14/12 Assistance Needed: +2    Subjective Data      Cognition  Overall Cognitive Status: Impaired Area of Impairment: Attention;Following commands;Problem solving;Executive functioning;Rancho level Arousal/Alertness: Awake/alert Orientation Level: Person Behavior During Session: [facility] for tasks performed Current Attention Level: Sustained Following Commands: Follows one step commands consistently    Balance  Balance Balance Assessed: Yes Static Sitting Balance Static Sitting - Balance Support: Feet supported Static Sitting - Level of Assistance: 4: Min assist Static Sitting - Comment/# of Minutes: Intermittent min (A) to maintain balance  End of Session PT - End of Session Equipment Utilized During Treatment: Gait belt Activity Tolerance: Patient limited by fatigue Patient left: in chair;with call bell/phone within  reach;with family/visitor present Nurse Communication: Mobility status    Skyrah Krupp 04/14/2012, 1:30 PM Jake Shark, PT  DPT [phone]

## 2012-04-14 NOTE — Progress Notes (Signed)
SUBJECTIVE "ok"   1. Dizziness   2. Fall   3. Frontal lobe contusion   4. Laceration of head   5. COPD (chronic obstructive pulmonary disease)     Past Medical History  Diagnosis Date  . COPD (chronic obstructive pulmonary disease)   . Frequent UTI   . Hypertension   . Arthritis   . Osteoarthritis   . Hypercholesteremia   . Asthma   . Thyroid disease   . Environmental allergies   . Osteopenia   . Macular degeneration   . Hearing loss   . Constipation   . Back pain   . Dementia   . PONV (postoperative nausea and vomiting)   . Hypothyroidism   . Blood transfusion     " no reaction to transfusion "   Current Facility-Administered Medications  Medication Dose Route Frequency Provider Last Rate Last Dose  . acetaminophen (TYLENOL) tablet 650 mg  650 mg Oral Q6H PRN Antonieta Pert, MD       Or  . acetaminophen (TYLENOL) suppository 650 mg  650 mg Rectal Q6H PRN Antonieta Pert, MD      . amLODipine (NORVASC) tablet 10 mg  10 mg Oral Daily Adeline C Viyuoh, MD      . bisacodyl (DULCOLAX) suppository 10 mg  10 mg Rectal Daily PRN Antonieta Pert, MD      . budesonide-formoterol (SYMBICORT) 160-4.5 MCG/ACT inhaler 2 puff  2 puff Inhalation BID Antonieta Pert, MD   2 puff at 04/13/12 2048  . DULoxetine (CYMBALTA) DR capsule 60 mg  60 mg Oral Daily Antonieta Pert, MD   60 mg at 04/13/12 0950  . haloperidol lactate (HALDOL) injection 2 mg  2 mg Intravenous Q6H PRN Antonieta Pert, MD   2 mg at 04/13/12 1618  . hydrALAZINE (APRESOLINE) injection 10 mg  10 mg Intravenous Q8H PRN Antonieta Pert, MD   10 mg at 04/13/12 0548  . levofloxacin (LEVAQUIN) tablet 500 mg  500 mg Oral Q48H Christella Hartigan, PHARMD   500 mg at 04/12/12 2037  . levothyroxine (SYNTHROID, LEVOTHROID) tablet 50 mcg  50 mcg Oral Q breakfast Antonieta Pert, MD   50 mcg at 04/13/12 0751  . lidocaine (LIDODERM) 5 % 1 patch  1 patch Transdermal Daily Antonieta Pert, MD   1 patch at 04/13/12 0950  .  lubiprostone (AMITIZA) capsule 24 mcg  24 mcg Oral BID WC Antonieta Pert, MD   24 mcg at 04/13/12 1739  . meclizine (ANTIVERT) tablet 12.5 mg  12.5 mg Oral Q8H PRN Antonieta Pert, MD   12.5 mg at 04/08/12 1838  . metoprolol succinate (TOPROL-XL) 24 hr tablet 50 mg  50 mg Oral Daily Antonieta Pert, MD   50 mg at 04/13/12 0950  . olopatadine (PATANOL) 0.1 % ophthalmic solution 1 drop  1 drop Both Eyes BID Antonieta Pert, MD   1 drop at 04/13/12 2146  . ondansetron (ZOFRAN) tablet 4 mg  4 mg Oral Q6H PRN Antonieta Pert, MD       Or  . ondansetron [employer] [employer]) injection 4 mg  4 mg Intravenous Q6H PRN Antonieta Pert, MD      . senna (SENOKOT) tablet 8.6 mg  1 tablet Oral BID PRN Antonieta Pert, MD      . simvastatin (ZOCOR) tablet 20 mg  20 mg Oral q1800 Antonieta Pert, MD   20 mg at 04/13/12 1739  .  traMADol (ULTRAM) tablet 50 mg  50 mg Oral Q6H PRN Marino Rogerson, MD   50 mg at 04/13/12 0951  . DISCONTD: amLODipine (NORVASC) tablet 10 mg  10 mg Oral Daily Jhamal Plucinski, MD   10 mg at 04/13/12 0950   Allergies  Allergen Reactions  . Hyomax (Hyoscyamine Sulfate) Other (See Comments)    unknown  . Other     Negative reactions to narcotics/opiates  . Oxycodone Hcl Er Other (See Comments)    unknown  . Sulfa Antibiotics Other (See Comments)    unknown   Principal Problem:  *Cerebral hemorrhage Active Problems:  Fall  Dizziness  HTN (hypertension)  Osteoarthritis  COPD (chronic obstructive pulmonary disease)   Vital signs in last 24 hours: Temp:  [97.8 F (36.6 C)-99.2 F (37.3 C)] 99.2 F (37.3 C) (05/13 0626) Pulse Rate:  [62-82] 73  (05/13 0626) Resp:  [18-20] 18  (05/13 0626) BP: (92-146)/(52-73) 121/67 mmHg (05/13 0626) SpO2:  [93 %-100 %] 100 % (05/13 0626) Weight change:  Last BM Date: 04/07/12  Intake/Output from previous day:   Intake/Output this shift:    Lab Results:  [facility] 04/12/12 0720  WBC 10.0  HGB 10.8*  HCT 30.8*  PLT 354    BMET  Basename 04/13/12 0555 04/12/12 0720  NA 134* 137  K 3.8 3.3*  CL 98 99  CO2 24 28  GLUCOSE 96 79  BUN 18 18  CREATININE 0.63 0.75  CALCIUM 9.2 9.2    Studies/Results: No results found.  Medications: I have reviewed the patient's current medications.   Physical exam GENERAL- alert HEAD- normal atraumatic, no neck masses, normal thyroid, no jvd RESPIRATORY- appears well, vitals normal, no respiratory distress, acyanotic, normal RR, ear and throat exam is normal, neck free of mass or lymphadenopathy, chest clear, no wheezing, crepitations, rhonchi, normal symmetric air entry CVS- regular rate and rhythm, S1, S2 normal, no murmur, click, rub or gallop ABDOMEN- abdomen is soft without significant tenderness, masses, organomegaly or guarding NEURO- calmly confused. EXTREMITIES- extremities normal, atraumatic, no cyanosis or edema  Plan  * Cerebral hemorrhage(ICH due to contution/subdural)/Fall/ Dizziness- seems stable. Appreciate PT. Continue to Mobilize. For SNF. * HTN (hypertension)- Fluctuating control. Continue norvasc. * Osteoarthritis/chronic pain. Now on ultram,.  * COPD (chronic obstructive pulmonary disease)- stable  * strep viridans UTI- on levaquin per pharmacy. Appreciate input. To complete 7 days. * disposition- For snf when bed available, hopefully tuesday.        Rylei Codispoti 04/14/2012 9:29 AM Pager: [phone].

## 2012-04-15 DIAGNOSIS — R42 Dizziness and giddiness: Secondary | ICD-10-CM

## 2012-04-15 DIAGNOSIS — A499 Bacterial infection, unspecified: Secondary | ICD-10-CM | POA: Diagnosis present

## 2012-04-15 DIAGNOSIS — E782 Mixed hyperlipidemia: Secondary | ICD-10-CM

## 2012-04-15 DIAGNOSIS — E119 Type 2 diabetes mellitus without complications: Secondary | ICD-10-CM

## 2012-04-15 DIAGNOSIS — I619 Nontraumatic intracerebral hemorrhage, unspecified: Secondary | ICD-10-CM

## 2012-04-15 MED ORDER — AMLODIPINE BESYLATE 10 MG PO TABS: 10.0000 mg | ORAL_TABLET | Freq: Every day | ORAL | Status: AC

## 2012-04-15 MED ORDER — TRAMADOL HCL 50 MG PO TABS: 50.0000 mg | ORAL_TABLET | Freq: Four times a day (QID) | ORAL | Status: AC | PRN

## 2012-04-15 MED ORDER — FOLIC ACID 1 MG PO TABS: 1.0000 mg | ORAL_TABLET | Freq: Every day | ORAL | Status: AC

## 2012-04-15 MED ORDER — LEVOFLOXACIN 500 MG PO TABS: 500.0000 mg | ORAL_TABLET | ORAL | Status: AC

## 2012-04-15 MED ORDER — OLOPATADINE HCL 0.1 % OP SOLN: 1.0000 [drp] | Freq: Two times a day (BID) | OPHTHALMIC | Status: DC

## 2012-04-15 NOTE — Clinical Social Work Note (Signed)
Pt is ready for discharge today to [place]. Pt has been without a sitter for more than 24 hours and returned to baseline, behaviorally. Facility has received discharge summary and is ready to accept. Family is agreeable to discharge plan. [facility] will provide transport to facility. CSW is signing off as no further needs identified.   Dede Query, MSW, Theresia Majors [phone]

## 2012-04-15 NOTE — Clinical Social Work Placement (Signed)
     Clinical Social Work Department CLINICAL SOCIAL WORK PLACEMENT NOTE 04/15/2012  Patient:  Shirley Bond, Shirley Bond  Account Number:  [id number] Admit date:  04/07/2012  Clinical Social Worker:  Peggyann Shoals  Date/time:  04/15/2012 02:00 PM  Clinical Social Work is seeking post-discharge placement for this patient at the following level of care:   SKILLED NURSING   (*CSW will update this form in Epic as items are completed)   04/08/2012  Patient/family provided with [facility] Department of Clinical Social Works list of facilities offering this level of care within the geographic area requested by the patient (or if unable, by the patients family).  04/08/2012  Patient/family informed of their freedom to choose among providers that offer the needed level of care, that participate in Medicare, Medicaid or managed care program needed by the patient, have an available bed and are willing to accept the patient.  04/08/2012  Patient/family informed of [facility] ownership interest in [facility], as well as of the fact that they are under no obligation to receive care at this facility.  PASARR submitted to EDS on 04/08/2012 PASARR number received from EDS on   FL2 transmitted to all facilities in geographic area requested by pt/family on  04/08/2012 FL2 transmitted to all facilities within larger geographic area on   Patient informed that his/her managed care company has contracts with or will negotiate with  certain facilities, including the following:     Patient/family informed of bed offers received:  04/08/2012 Patient chooses bed at [facility] Physician recommends and patient chooses bed at    Patient to be transferred to [facility] on  04/15/2012 Patient to be transferred to facility by [facility]  The following physician request were entered in Epic:   Additional Comments: Pt was admitted from [facility] and is able  to return. Pt also has an existing PASARR#.  Dede Query, MSW, Theresia Majors [phone]

## 2012-04-15 NOTE — Discharge Summary (Signed)
DISCHARGE SUMMARY  Shirley Bond  MR#: [id number]  DOB:Jan 24, 1924  Date of Admission: 04/07/2012 Date of Discharge: 04/15/2012  Attending Physician:Nevyn Bossman  Patient's WUJ:WJXBJY,NWGNFAO Kellie Shropshire, MD, MD  Consults:  Dr Jimmye Norman         Dr Julio Sicks Discharge Diagnoses: Present on Admission:  .Fall .Cerebral hemorrhage .Dizziness .HTN (hypertension) .Osteoarthritis .COPD (chronic obstructive pulmonary disease) .UTI (urinary tract infection), bacterial   Hospital Course: Ms Ungerer was admitted on 04/07/12 after a fall at snf. Per her daughter, she had complained of some dizziness for 2-3 weeks prior to the fall. CT brain subsequently showed a small focal parenchymal hemorrhage in the left frontal lobe and possible small left frontal subdural hematoma without mid line shift. Neurosurgery and Trauma surgery followed patient, and recommended conservative management. As she continued to be encephalopathic, septic work up was pursued, with findings of strep viridans in urine culture. She should complete 3 more days of levaquin for this. Her mentation also improved with discontinuation of methadone. Pain wise, ultram seems to be working just ok. Ms Loney also had carotid duplex, and 2decho, which were unrevealing. Because of renal insufficiency, acei was discontinued, and norvasc started during the hospital stay. She is at her baseline, and will d/c to snf today.   Medication List  As of 04/15/2012  9:12 AM   STOP taking these medications         BOOST PLUS PO      lisinopril 20 MG tablet      methadone 5 MG tablet      naproxen sodium 220 MG tablet      nitrofurantoin 100 MG capsule      PROBIOTIC PO         TAKE these medications         acetaminophen 325 MG tablet   Commonly known as: TYLENOL   Take 325 mg by mouth every 6 (six) hours as needed. For pain      amLODipine 10 MG tablet   Commonly known as: NORVASC   Take 1 tablet (10 mg total) by mouth  daily.      ATROVENT NA   Place 1 spray into the nose 2 (two) times daily.      beta carotene w/minerals tablet   Take 1 tablet by mouth 2 (two) times daily.      budesonide-formoterol 160-4.5 MCG/ACT inhaler   Commonly known as: SYMBICORT   Inhale 2 puffs into the lungs 2 (two) times daily.      CALTRATE 600 PO   Take by mouth 3 (three) times daily.      chlorhexidine 0.12 % solution   Commonly known as: PERIDEX   Use as directed 15 mLs in the mouth or throat at bedtime.      conjugated estrogens vaginal cream   Commonly known as: PREMARIN   Place 0.5 g vaginally once a week. On saturday      DULoxetine 60 MG capsule   Commonly known as: CYMBALTA   Take 60 mg by mouth daily.      folic acid 1 MG tablet   Commonly known as: FOLVITE   Take 1 tablet (1 mg total) by mouth daily.      hyoscyamine 0.125 MG SL tablet   Commonly known as: LEVSIN SL   Place 0.125 mg under the tongue at bedtime as needed.      ketotifen 0.025 % ophthalmic solution   Commonly known as: ZADITOR   Place 1 drop into both  eyes 2 (two) times daily.      levofloxacin 500 MG tablet   Commonly known as: LEVAQUIN   Take 1 tablet (500 mg total) by mouth every other day.      levothyroxine 50 MCG tablet   Commonly known as: SYNTHROID, LEVOTHROID   Take 50 mcg by mouth daily.      lidocaine 5 %   Commonly known as: LIDODERM   Place 1 patch onto the skin daily. Remove & Discard patch within 12 hours or as directed by MD      lubiprostone 24 MCG capsule   Commonly known as: AMITIZA   Take 24 mcg by mouth 2 (two) times daily with a meal.      meclizine 12.5 MG tablet   Commonly known as: ANTIVERT   Take 12.5 mg by mouth every 8 (eight) hours as needed. For vertigo      Melatonin 3 MG Tabs   Take 6 mg by mouth at bedtime.      metoprolol succinate 50 MG 24 hr tablet   Commonly known as: TOPROL-XL   Take 50 mg by mouth daily. Take with or immediately following a meal.      MIRALAX powder    Generic drug: polyethylene glycol powder   Take 17 g by mouth daily.      olopatadine 0.1 % ophthalmic solution   Commonly known as: PATANOL   Place 1 drop into both eyes 2 (two) times daily.      OMEGA 3 PO   Take 2,000 mg by mouth 2 (two) times daily.      pravastatin 40 MG tablet   Commonly known as: PRAVACHOL   Take 40 mg by mouth daily.      senna 8.6 MG tablet   Commonly known as: SENOKOT   Take 1 tablet by mouth 2 (two) times daily as needed. For constipation      SYSTANE OP   Place 1-2 drops into both eyes 4 (four) times daily.      traMADol 50 MG tablet   Commonly known as: ULTRAM   Take 1 tablet (50 mg total) by mouth every 6 (six) hours as needed.      vitamin B-12 1000 MCG tablet   Commonly known as: CYANOCOBALAMIN   Inject 1,000 mcg into the muscle every 30 (thirty) days. On 15th of each month      VITAMIN B12-FOLIC ACID PO   Take 1 tablet by mouth daily.             Day of Discharge BP 148/72  Pulse 57  Temp(Src) 98.1 F (36.7 C) (Axillary)  Resp 19  Ht 5\' 2"  (1.575 m)  Wt 53.3 kg (117 lb 8.1 oz)  BMI 21.49 kg/m2  SpO2 93%  Physical Exam: Calm at rest, not in distress.  No results found for this or any previous visit (from the past 24 hour(s)).  Disposition: to snf today.   Follow-up Appts: Discharge Orders    Future Orders Please Complete By Expires   Diet - low sodium heart healthy      Increase activity slowly           Time spent in discharge (includes decision making & examination of pt): 37 minutes  Signed: Addyson Traub 04/15/2012, 9:12 AM

## 2012-04-15 NOTE — Progress Notes (Signed)
Physical Therapy Treatment Patient Details Name: Shirley Bond MRN: [id number] DOB: [birth year] Today's Date: 04/15/2012 Time: 1205-1230 PT Time Calculation (min): 25 min  PT Assessment / Plan / Recommendation Comments on Treatment Session  Pt generally alert today, but less so than last treatment.  Pt did follow commands, but with slowed response and occaisional need to help her focus.    Follow Up Recommendations  Skilled nursing facility    Barriers to Discharge        Equipment Recommendations  Defer to next venue    Recommendations for Other Services    Frequency Min 3X/week   Plan Discharge plan remains appropriate;Frequency remains appropriate    Precautions / Restrictions Precautions Precautions: Fall Restrictions Weight Bearing Restrictions: No   Pertinent Vitals/Pain     Mobility  Bed Mobility Bed Mobility: Supine to Sit;Sitting - Scoot to Edge of Bed Supine to Sit: 3: Mod assist Sitting - Scoot to Edge of Bed: 3: Mod assist Details for Bed Mobility Assistance: vc's to help pt initiate assist with UE/ hand placement;(A) to elevate shoulders OOB and use of pad to assist with scooting Transfers Transfers: Sit to Stand;Stand to Sit Sit to Stand: 3: Mod assist;With upper extremity assist;From bed;From chair/3-in-1 Stand to Sit: 3: Mod assist;With upper extremity assist;To chair/3-in-1 Details for Transfer Assistance: vc's for hand placement and to assist initiation; assist to come forward over her BOS and to assist with controlled descent Ambulation/Gait Ambulation/Gait Assistance: 4: Min assist Ambulation Distance (Feet): 100 Feet (times 2 with short sitting rest in between) Assistive device: Rolling walker Ambulation/Gait Assistance Details: gait characterized by short even steps and varying with shuffled steps.  Pt does not stay close enough to RW, but was centered in the RW.  Posture flexed, but functional. Gait Pattern: Step-through pattern;Decreased step  length - right;Decreased step length - left;Decreased stride length;Shuffle Gait velocity: slow Stairs: No Wheelchair Mobility Wheelchair Mobility: No    Exercises     PT Diagnosis:    PT Problem List:   PT Treatment Interventions:     PT Goals Acute Rehab PT Goals Time For Goal Achievement: 04/23/12 Potential to Achieve Goals: Good PT Goal: Supine/Side to Sit - Progress: Progressing toward goal PT Goal: Sit at Edge Of Bed - Progress: Progressing toward goal PT Goal: Sit to Stand - Progress: Progressing toward goal PT Goal: Stand to Sit - Progress: Progressing toward goal PT Goal: Ambulate - Progress: Progressing toward goal  Visit Information  Last PT Received On: 04/15/12 Assistance Needed: +1 (+1 for chair)    Subjective Data  Subjective: I'm going to have to sit now... I'm going to fall   Cognition  Overall Cognitive Status: Impaired Area of Impairment: Attention;Following commands;Problem solving;Executive functioning;Rancho level Arousal/Alertness: Awake/alert Orientation Level: Person Behavior During Session: [facility] for tasks performed Current Attention Level: Sustained Following Commands: Follows one step commands consistently Problem Solving: sequenced well with brushing teeth with some vc for guiding    Balance     End of Session PT - End of Session Activity Tolerance: Patient limited by fatigue;Patient tolerated treatment well Patient left: in chair;with call bell/phone within reach Nurse Communication: Mobility status    Drayson Dorko, Eliseo Gum 04/15/2012, 12:45 PM  04/15/2012  [place] Bing, PT [phone] (pager)

## 2012-04-15 NOTE — Progress Notes (Signed)
Speech Language Pathology Dysphagia Treatment Patient Details Name: Shirley Bond MRN: [id number] DOB: [birth year] Today's Date: 04/15/2012 Time: 1218-1226 SLP Time Calculation (min): 8 min  Assessment / Plan / Recommendation Clinical Impression  Brief treatment session while patient was resting from walking with PT.  Thin liquid sips were grossly WFL with one coughing episode out of 6 or 7 swallows.  Patient much more alert, verbalizing yet confused.  Will f/u 5/15 for potential diet upgrade.    Diet Recommendation  Continue with Current Diet: Dysphagia 1 (puree);Thin liquid    SLP Plan Continue with current plan of care   Pertinent Vitals/Pain n/a   Swallowing Goals  SLP Swallowing Goals Patient will consume recommended diet without observed clinical signs of aspiration with: Supervision/safety Swallow Study Goal #1 - Progress: Progressing toward goal Patient will utilize recommended strategies during swallow to increase swallowing safety with: Supervision/safety Swallow Study Goal #2 - Progress: Progressing toward goal  Dysphagia Treatment Treatment focused on: Skilled observation of diet tolerance Treatment Methods/Modalities: Skilled observation Patient observed directly with PO's: Yes Type of PO's observed: Thin liquids Feeding: Able to feed self Liquids provided via: Cup Pharyngeal Phase Signs & Symptoms: Immediate cough Type of cueing: Verbal Amount of cueing: Minimal   Myra Rude, M.S.,CCC-SLP Pager [phone] 04/15/2012, 12:30 PM

## 2012-04-17 MED FILL — Thrombin For Soln Kit 5000 Unit: CUTANEOUS | Qty: 1 | Status: AC

## 2012-04-29 ENCOUNTER — Emergency Department (HOSPITAL_COMMUNITY): Payer: Medicare Other

## 2012-04-29 ENCOUNTER — Emergency Department (HOSPITAL_COMMUNITY)
Admission: EM | Admit: 2012-04-29 | Discharge: 2012-04-30 | Disposition: A | Discharge status: Skilled Nursing Facility | Payer: Medicare Other | Attending: Emergency Medicine | Admitting: Emergency Medicine

## 2012-04-29 ENCOUNTER — Encounter (HOSPITAL_COMMUNITY): Payer: Self-pay

## 2012-04-29 DIAGNOSIS — S0100XA Unspecified open wound of scalp, initial encounter: Secondary | ICD-10-CM | POA: Insufficient documentation

## 2012-04-29 DIAGNOSIS — J449 Chronic obstructive pulmonary disease, unspecified: Secondary | ICD-10-CM | POA: Insufficient documentation

## 2012-04-29 DIAGNOSIS — S0101XA Laceration without foreign body of scalp, initial encounter: Secondary | ICD-10-CM

## 2012-04-29 DIAGNOSIS — M129 Arthropathy, unspecified: Secondary | ICD-10-CM | POA: Insufficient documentation

## 2012-04-29 DIAGNOSIS — E7800 Pure hypercholesterolemia, unspecified: Secondary | ICD-10-CM | POA: Insufficient documentation

## 2012-04-29 DIAGNOSIS — S0990XA Unspecified injury of head, initial encounter: Secondary | ICD-10-CM | POA: Insufficient documentation

## 2012-04-29 DIAGNOSIS — I10 Essential (primary) hypertension: Secondary | ICD-10-CM | POA: Insufficient documentation

## 2012-04-29 DIAGNOSIS — Y9210 Unspecified residential institution as the place of occurrence of the external cause: Secondary | ICD-10-CM | POA: Insufficient documentation

## 2012-04-29 DIAGNOSIS — J4489 Other specified chronic obstructive pulmonary disease: Secondary | ICD-10-CM | POA: Insufficient documentation

## 2012-04-29 DIAGNOSIS — S139XXA Sprain of joints and ligaments of unspecified parts of neck, initial encounter: Secondary | ICD-10-CM | POA: Insufficient documentation

## 2012-04-29 DIAGNOSIS — M549 Dorsalgia, unspecified: Secondary | ICD-10-CM | POA: Insufficient documentation

## 2012-04-29 DIAGNOSIS — W050XXA Fall from non-moving wheelchair, initial encounter: Secondary | ICD-10-CM | POA: Insufficient documentation

## 2012-04-29 DIAGNOSIS — E039 Hypothyroidism, unspecified: Secondary | ICD-10-CM | POA: Insufficient documentation

## 2012-04-29 DIAGNOSIS — E079 Disorder of thyroid, unspecified: Secondary | ICD-10-CM | POA: Insufficient documentation

## 2012-04-29 DIAGNOSIS — M1990 Unspecified osteoarthritis, unspecified site: Secondary | ICD-10-CM | POA: Insufficient documentation

## 2012-04-29 DIAGNOSIS — S161XXA Strain of muscle, fascia and tendon at neck level, initial encounter: Secondary | ICD-10-CM

## 2012-04-29 DIAGNOSIS — W19XXXA Unspecified fall, initial encounter: Secondary | ICD-10-CM

## 2012-04-29 DIAGNOSIS — Z79899 Other long term (current) drug therapy: Secondary | ICD-10-CM | POA: Insufficient documentation

## 2012-04-29 NOTE — ED Notes (Signed)
Pt was brought in tlhrough EMS. Pt comes from [facility] , pt was fell in her room and hit the back of her head. Per RN at facility pt was found with about 30 cc of blood on the ground around her head and  A 2-3 inch laceration to the left side of the back of her head. Pt is states" My back is killing me and my head is hurting" When asked to give a rate the back pt was not able to.

## 2012-04-29 NOTE — ED Notes (Signed)
Changed pt's brief and covered pt with extra blanket.

## 2012-04-29 NOTE — Discharge Instructions (Signed)
Head Injury, Adult  You have had a head injury that does not appear serious at this time. A concussion is a state of changed mental ability, usually from a blow to the head. You should take clear liquids for the rest of the day and then resume your regular diet. You should not take sedatives or alcoholic beverages for as long as directed by your caregiver after discharge. After injuries such as yours, most problems occur within the first 24 hours.  SYMPTOMS  These minor symptoms may be experienced after discharge:  · Memory difficulties.  · Dizziness.  · Headaches.  · Double vision.  · Hearing difficulties.  · Depression.  · Tiredness.  · Weakness.  · Difficulty with concentration.  If you experience any of these problems, you should not be alarmed. A concussion requires a few days for recovery. Many patients with head injuries frequently experience such symptoms. Usually, these problems disappear without medical care. If symptoms last for more than one day, notify your caregiver. See your caregiver sooner if symptoms are becoming worse rather than better.  HOME CARE INSTRUCTIONS   · During the next 24 hours you must stay with someone who can watch you for the warning signs listed below.  Although it is unlikely that serious side effects will occur, you should be aware of signs and symptoms which may necessitate your return to this location. Side effects may occur up to 7 - 10 days following the injury. It is important for you to carefully monitor your condition and contact your caregiver or seek immediate medical attention if there is a change in your condition.  SEEK IMMEDIATE MEDICAL CARE IF:   · There is confusion or drowsiness.  · You can not awaken the injured person.  · There is nausea (feeling sick to your stomach) or continued, forceful vomiting.  · You notice dizziness or unsteadiness which is getting worse, or inability to walk.  · You have convulsions or unconsciousness.  · You experience severe,  persistent headaches not relieved by over-the-counter or prescription medicines for pain. (Do not take aspirin as this impairs clotting abilities). Take other pain medications only as directed.  · You can not use arms or legs normally.  · There is clear or bloody discharge from the nose or ears.  MAKE SURE YOU:   · Understand these instructions.  · Will watch your condition.  · Will get help right away if you are not doing well or get worse.  Document Released: 11/19/2005 Document Revised: 11/08/2011 Document Reviewed: 10/07/2009  [employer]® Patient Information ©2012 [employer].

## 2012-04-29 NOTE — ED Notes (Signed)
Pt is currently in radiology. 

## 2012-04-29 NOTE — ED Notes (Signed)
Pt is back in room from radiology 

## 2012-04-29 NOTE — ED Provider Notes (Signed)
History     CSN: [id number]  Arrival date & time 04/29/12  2108   First MD Initiated Contact with Patient 04/29/12 2130      Chief Complaint  Patient presents with  . Back Pain  . Head Laceration    (Consider location/radiation/quality/duration/timing/severity/associated sxs/prior treatment) HPI Comments: Patient was getting up from wheelchair, fell backward and struck her head on the floor causing a laceration to her scalp.  She was recently admitted here for a similar fall which caused bleeding on the brain.    Patient is a 78 y.o. female presenting with back pain and scalp laceration. The history is provided by the patient, the nursing home and a relative.  Back Pain  This is a new problem. The current episode started less than 1 hour ago. The problem occurs constantly. The problem has not changed since onset. Head Laceration    Past Medical History  Diagnosis Date  . COPD (chronic obstructive pulmonary disease)   . Frequent UTI   . Hypertension   . Arthritis   . Osteoarthritis   . Hypercholesteremia   . Asthma   . Thyroid disease   . Environmental allergies   . Osteopenia   . Macular degeneration   . Hearing loss   . Constipation   . Back pain   . Dementia   . PONV (postoperative nausea and vomiting)   . Hypothyroidism   . Blood transfusion     " no reaction to transfusion "    Past Surgical History  Procedure Date  . Kyphosis surgery   . Spinal fusion   . Rotator cuff repair     unsuccessful x 2  . Bladder surgery     with mesh & vaginal wall    No family history on file.  History  Substance Use Topics  . Smoking status: Never Smoker   . Smokeless tobacco: Never Used  . Alcohol Use: No    OB History    Grav Para Term Preterm Abortions TAB SAB Ect Mult Living                  Review of Systems  Musculoskeletal: Positive for back pain.  All other systems reviewed and are negative.    Allergies  Codeine; Hyomax; Other; Oxycodone hcl  er; and Sulfa antibiotics  Home Medications   Current Outpatient Rx  Name Route Sig Dispense Refill  . ACETAMINOPHEN 325 MG PO TABS Oral Take 325 mg by mouth every 6 (six) hours as needed. For pain    . AMLODIPINE BESYLATE 10 MG PO TABS Oral Take 1 tablet (10 mg total) by mouth daily. 30 tablet 0  . OCUVITE PO TABS Oral Take 1 tablet by mouth 2 (two) times daily.    . BUDESONIDE-FORMOTEROL FUMARATE 160-4.5 MCG/ACT IN AERO Inhalation Inhale 2 puffs into the lungs 2 (two) times daily.    [name] CALTRATE 600 PO Oral Take by mouth 3 (three) times daily.    . CHLORHEXIDINE GLUCONATE 0.12 % MT SOLN Mouth/Throat Use as directed 15 mLs in the mouth or throat at bedtime.    [name] VITAMIN B12-FOLIC ACID PO Oral Take 1 tablet by mouth daily.    [name] ESTROGENS, CONJUGATED 0.625 MG/GM VA CREA Vaginal Place 0.5 g vaginally once a week. On saturday    . DULOXETINE HCL 60 MG PO CPEP Oral Take 60 mg by mouth daily.    [name] FOLIC ACID 1 MG PO TABS Oral Take 1 tablet (1 mg total) by  mouth daily. 30 tablet 0  . HYOSCYAMINE SULFATE 0.125 MG SL SUBL Sublingual Place 0.125 mg under the tongue at bedtime as needed.    . ATROVENT NA Nasal Place 1 spray into the nose 2 (two) times daily.     [name] KETOTIFEN FUMARATE 0.025 % OP SOLN Both Eyes Place 1 drop into both eyes 2 (two) times daily.    [name] LEVOTHYROXINE SODIUM 50 MCG PO TABS Oral Take 50 mcg by mouth daily.    [name] LIDOCAINE 5 % EX PTCH Transdermal Place 1 patch onto the skin daily. Remove & Discard patch within 12 hours or as directed by MD    . LUBIPROSTONE 24 MCG PO CAPS Oral Take 24 mcg by mouth 2 (two) times daily with a meal.    . MECLIZINE HCL 12.5 MG PO TABS Oral Take 12.5 mg by mouth every 8 (eight) hours as needed. For vertigo    . MELATONIN 3 MG PO TABS Oral Take 6 mg by mouth at bedtime.    [name] METOPROLOL SUCCINATE ER 50 MG PO TB24 Oral Take 50 mg by mouth daily. Take with or immediately following a meal.    . OLOPATADINE HCL 0.1 % OP SOLN Both Eyes Place 1 drop into both  eyes 2 (two) times daily. 5 mL 0  . OMEGA 3 PO Oral Take 2,000 mg by mouth 2 (two) times daily.    Frazier Butt OP Both Eyes Place 1-2 drops into both eyes 4 (four) times daily.    [name] POLYETHYLENE GLYCOL 3350 PO POWD Oral Take 17 g by mouth daily.    [name] PRAVASTATIN SODIUM 40 MG PO TABS Oral Take 40 mg by mouth daily.    . SENNOSIDES 8.6 MG PO TABS Oral Take 1 tablet by mouth 2 (two) times daily as needed. For constipation    . TRAMADOL HCL 50 MG PO TABS Oral Take 50 mg by mouth every 6 (six) hours as needed. For pain    . LEVOFLOXACIN 500 MG PO TABS Oral Take 500 mg by mouth every other day. For 5 doses Ended 5/24    . VITAMIN B-12 1000 MCG PO TABS Intramuscular Inject 1,000 mcg into the muscle every 30 (thirty) days. On 15th of each month      BP 171/85  Pulse 78  Temp(Src) 98 F (36.7 C) (Oral)  Resp 19  SpO2 98%  Physical Exam  Nursing note and vitals reviewed. Constitutional: She is oriented to person, place, and time. She appears well-developed and well-nourished. No distress.  Eyes: EOM are normal. Pupils are equal, round, and reactive to light.  Neck: Normal range of motion. Neck supple.  Cardiovascular: Normal rate and regular rhythm.   Pulmonary/Chest: Effort normal.  Abdominal: Soft. Bowel sounds are normal. She exhibits no distension. There is no tenderness.  Musculoskeletal: Normal range of motion. She exhibits no edema.  Neurological: She is alert and oriented to person, place, and time. No cranial nerve deficit. Coordination normal.  Skin: Skin is warm and dry. She is not diaphoretic.    ED Course  Procedures (including critical care time)  Labs Reviewed - No data to display No results found.   No diagnosis found.  LACERATION REPAIR Performed by: Geoffery Lyons Authorized by: Geoffery Lyons Consent: Verbal consent obtained. Risks and benefits: risks, benefits and alternatives were discussed Consent given by: patient Patient identity confirmed: provided  demographic data Prepped and Draped in normal sterile fashion Wound explored  Laceration Location: scapl  Laceration Length: 3.5  cm  No Foreign Bodies seen or palpated  Anesthesia: local infiltration  Local anesthetic: lidocaine 2% with epinephrine  Anesthetic total: 2 ml  Irrigation method: syringe Amount of cleaning: standard  Skin closure: staples  Number of sutures: 5  Technique: staples  Patient tolerance: Patient tolerated the procedure well with no immediate complications.  MDM  The ct of the head and cervical spine are unremarkable.  There is no evidence of intracranial bleeding, and the blood that was there from her prior fall has since resolved.  She is neurologically at her baseline.  Staples out in one week.  Return prn.          Geoffery Lyons, MD 04/29/12 [phone]

## 2012-05-07 ENCOUNTER — Emergency Department (HOSPITAL_COMMUNITY): Payer: Medicare Other

## 2012-05-07 ENCOUNTER — Emergency Department (HOSPITAL_COMMUNITY)
Admission: EM | Admit: 2012-05-07 | Discharge: 2012-05-07 | Disposition: A | Discharge status: Skilled Nursing Facility | Payer: Medicare Other | Attending: Emergency Medicine | Admitting: Emergency Medicine

## 2012-05-07 ENCOUNTER — Encounter (HOSPITAL_COMMUNITY): Payer: Self-pay

## 2012-05-07 DIAGNOSIS — M47812 Spondylosis without myelopathy or radiculopathy, cervical region: Secondary | ICD-10-CM | POA: Insufficient documentation

## 2012-05-07 DIAGNOSIS — S0180XA Unspecified open wound of other part of head, initial encounter: Secondary | ICD-10-CM | POA: Insufficient documentation

## 2012-05-07 DIAGNOSIS — Z87828 Personal history of other (healed) physical injury and trauma: Secondary | ICD-10-CM | POA: Insufficient documentation

## 2012-05-07 DIAGNOSIS — Z79899 Other long term (current) drug therapy: Secondary | ICD-10-CM | POA: Insufficient documentation

## 2012-05-07 DIAGNOSIS — IMO0002 Reserved for concepts with insufficient information to code with codable children: Secondary | ICD-10-CM

## 2012-05-07 DIAGNOSIS — S0003XA Contusion of scalp, initial encounter: Secondary | ICD-10-CM | POA: Insufficient documentation

## 2012-05-07 DIAGNOSIS — Y9210 Unspecified residential institution as the place of occurrence of the external cause: Secondary | ICD-10-CM | POA: Insufficient documentation

## 2012-05-07 DIAGNOSIS — I10 Essential (primary) hypertension: Secondary | ICD-10-CM | POA: Insufficient documentation

## 2012-05-07 DIAGNOSIS — Z4802 Encounter for removal of sutures: Secondary | ICD-10-CM | POA: Insufficient documentation

## 2012-05-07 DIAGNOSIS — F0390 Unspecified dementia without behavioral disturbance: Secondary | ICD-10-CM | POA: Insufficient documentation

## 2012-05-07 DIAGNOSIS — E039 Hypothyroidism, unspecified: Secondary | ICD-10-CM | POA: Insufficient documentation

## 2012-05-07 DIAGNOSIS — S0083XA Contusion of other part of head, initial encounter: Secondary | ICD-10-CM | POA: Insufficient documentation

## 2012-05-07 DIAGNOSIS — W19XXXA Unspecified fall, initial encounter: Secondary | ICD-10-CM | POA: Insufficient documentation

## 2012-05-07 MED ORDER — TETANUS-DIPHTH-ACELL PERTUSSIS 5-2.5-18.5 LF-MCG/0.5 IM SUSP
0.5000 mL | Freq: Once | INTRAMUSCULAR | Status: AC
  Administered 2012-05-07: 0.5 mL via INTRAMUSCULAR
  Filled 2012-05-07: qty 0.5

## 2012-05-07 NOTE — ED Provider Notes (Signed)
History     CSN: [id number]  Arrival date & time 05/07/12  0120   First MD Initiated Contact with Patient 05/07/12 0124      Chief Complaint  Patient presents with  . Fall    (Consider location/radiation/quality/duration/timing/severity/associated sxs/prior treatment) HPI History provided by patient, EMS and nursing report.   Fall at nursing home. Unwitnessed fall while attempting to get out of bed struck left for head. Has old laceration to left scalp with left neck  Ecchymosis  That .appears old.  Patient denies any other pain injury or trauma. She sustained a laceration to left for head with bleeding resolved prior to arrival. No extremity injury. No chest pain. No shortness of breath. Has been complaining of dizziness the last few weeks. Daughter bedside relays that she has scheduled appointment with neurologist, Dr. Terrace Arabia, for this persistent dizziness.  No weakness or numbness.  Past Medical History  Diagnosis Date  . COPD (chronic obstructive pulmonary disease)   . Frequent UTI   . Hypertension   . Arthritis   . Osteoarthritis   . Hypercholesteremia   . Asthma   . Thyroid disease   . Environmental allergies   . Osteopenia   . Macular degeneration   . Hearing loss   . Constipation   . Back pain   . Dementia   . PONV (postoperative nausea and vomiting)   . Hypothyroidism   . Blood transfusion     " no reaction to transfusion "    Past Surgical History  Procedure Date  . Kyphosis surgery   . Spinal fusion   . Rotator cuff repair     unsuccessful x 2  . Bladder surgery     with mesh & vaginal wall    No family history on file.  History  Substance Use Topics  . Smoking status: Never Smoker   . Smokeless tobacco: Never Used  . Alcohol Use: No    OB History    Grav Para Term Preterm Abortions TAB SAB Ect Mult Living                  Review of Systems  Constitutional: Negative for fever and chills.  HENT: Negative for neck pain and neck stiffness.     Eyes: Negative for pain.  Respiratory: Negative for shortness of breath.   Cardiovascular: Negative for chest pain.  Gastrointestinal: Negative for abdominal pain.  Genitourinary: Negative for dysuria.  Musculoskeletal: Negative for back pain.  Skin: Positive for wound. Negative for rash.  Neurological: Negative for seizures, syncope, weakness, numbness and headaches.  All other systems reviewed and are negative.    Allergies  Codeine; Hyomax; Other; Oxycodone hcl er; and Sulfa antibiotics  Home Medications   Current Outpatient Rx  Name Route Sig Dispense Refill  . ACETAMINOPHEN 325 MG PO TABS Oral Take 325 mg by mouth every 6 (six) hours as needed. For pain    . AMLODIPINE BESYLATE 10 MG PO TABS Oral Take 1 tablet (10 mg total) by mouth daily. 30 tablet 0  . OCUVITE PO TABS Oral Take 1 tablet by mouth 2 (two) times daily.    . BUDESONIDE-FORMOTEROL FUMARATE 160-4.5 MCG/ACT IN AERO Inhalation Inhale 2 puffs into the lungs 2 (two) times daily.    [name] CALTRATE 600 PO Oral Take by mouth 3 (three) times daily.    . DULOXETINE HCL 60 MG PO CPEP Oral Take 60 mg by mouth daily.    [name] FOLIC ACID 1 MG PO TABS  Oral Take 1 tablet (1 mg total) by mouth daily. 30 tablet 0  . ATROVENT NA Nasal Place 1 spray into the nose 2 (two) times daily.     [name] KETOTIFEN FUMARATE 0.025 % OP SOLN Both Eyes Place 1 drop into both eyes 2 (two) times daily.    [name] LEVOTHYROXINE SODIUM 50 MCG PO TABS Oral Take 50 mcg by mouth daily.    [name] LIDOCAINE 5 % EX PTCH Transdermal Place 1 patch onto the skin daily. Remove & Discard patch within 12 hours or as directed by MD    . LUBIPROSTONE 24 MCG PO CAPS Oral Take 24 mcg by mouth 2 (two) times daily with a meal.    . MELATONIN 3 MG PO TABS Oral Take 6 mg by mouth at bedtime.    [name] METOPROLOL SUCCINATE ER 50 MG PO TB24 Oral Take 50 mg by mouth daily. Take with or immediately following a meal.    . OLOPATADINE HCL 0.1 % OP SOLN Both Eyes Place 1 drop into both eyes 2 (two) times  daily. 5 mL 0  . OMEGA 3 PO Oral Take 2,000 mg by mouth 2 (two) times daily.    Frazier Butt OP Both Eyes Place 1-2 drops into both eyes 4 (four) times daily.    [name] POLYETHYLENE GLYCOL 3350 PO POWD Oral Take 17 g by mouth daily.    [name] PRAVASTATIN SODIUM 40 MG PO TABS Oral Take 40 mg by mouth daily.    . TRAMADOL HCL 50 MG PO TABS Oral Take 50 mg by mouth every 6 (six) hours as needed. For pain    . VITAMIN A 16109 UNITS PO CAPS Oral Take 10,000 Units by mouth daily.    [name] VITAMIN B-12 1000 MCG PO TABS Intramuscular Inject 1,000 mcg into the muscle every 30 (thirty) days. On 15th of each month    . MECLIZINE HCL 12.5 MG PO TABS Oral Take 12.5 mg by mouth every 8 (eight) hours as needed. For vertigo    . SENNOSIDES 8.6 MG PO TABS Oral Take 1 tablet by mouth 2 (two) times daily as needed. For constipation      BP 165/75  Pulse 64  Temp(Src) 98.2 F (36.8 C) (Oral)  Resp 18  SpO2 99%  Physical Exam  Constitutional: She appears well-developed and well-nourished.  HENT:  Head: Normocephalic.       3 cm left for head laceration full-thickness hemostatic. Soft tissue tenderness. No underlying bony tenderness or deformity. Well-healing left scalp lac with staples in place. No tenderness to that area.  Eyes: Conjunctivae and EOM are normal. Pupils are equal, round, and reactive to light.  Neck: Trachea normal. Neck supple. No thyromegaly present.       No midline cervical tenderness or perform any  Cardiovascular: Normal rate, regular rhythm, S1 normal, S2 normal and normal pulses.     No systolic murmur is present   No diastolic murmur is present  Pulses:      Radial pulses are 2+ on the right side, and 2+ on the left side.  Pulmonary/Chest: Effort normal and breath sounds normal. She has no wheezes. She has no rhonchi. She has no rales. She exhibits no tenderness.  Abdominal: Soft. Normal appearance and bowel sounds are normal. There is no tenderness. There is no CVA tenderness and negative  Murphy's sign.  Musculoskeletal:       Moves all extremities x4 without tenderness or deficits  Neurological: She is alert. She has normal  strength. No cranial nerve deficit or sensory deficit. GCS eye subscore is 4. GCS verbal subscore is 5. GCS motor subscore is 6.  Skin: Skin is warm and dry. No rash noted. She is not diaphoretic.  Psychiatric: Her speech is normal.       Cooperative and appropriate    ED Course  SUTURE REMOVAL Date/Time: 05/07/2012 5:14 AM Performed by: Sunnie Nielsen Authorized by: Sunnie Nielsen Consent: Verbal consent obtained. Risks and benefits: risks, benefits and alternatives were discussed Consent given by: patient Patient understanding: patient states understanding of the procedure being performed Patient consent: the patient's understanding of the procedure matches consent given Procedure consent: procedure consent matches procedure scheduled Required items: required blood products, implants, devices, and special equipment available Patient identity confirmed: verbally with patient Time out: Immediately prior to procedure a "time out" was called to verify the correct patient, procedure, equipment, support staff and site/side marked as required. Body area: head/neck Location details: scalp Staples Removed: 6 Post-removal: dressing applied and antibiotic ointment applied Patient tolerance: Patient tolerated the procedure well with no immediate complications.  LACERATION REPAIR Date/Time: 05/07/2012 5:29 AM Performed by: Sunnie Nielsen Authorized by: Sunnie Nielsen Consent: Verbal consent obtained. Risks and benefits: risks, benefits and alternatives were discussed Consent given by: patient Patient understanding: patient states understanding of the procedure being performed Patient consent: the patient's understanding of the procedure matches consent given Procedure consent: procedure consent matches procedure scheduled Required items: required blood products,  implants, devices, and special equipment available Patient identity confirmed: verbally with patient Time out: Immediately prior to procedure a "time out" was called to verify the correct patient, procedure, equipment, support staff and site/side marked as required. Body area: head/neck Location details: forehead Laceration length: 4 cm Tendon involvement: none Nerve involvement: none Vascular damage: no Anesthesia: local infiltration Local anesthetic: lidocaine 1% without epinephrine Anesthetic total: 3 ml Preparation: Patient was prepped and draped in the usual sterile fashion. Irrigation solution: saline Irrigation method: syringe Amount of cleaning: extensive Debridement: minimal Degree of undermining: none Skin closure: 6-0 nylon Number of sutures: 6 Technique: simple Approximation: close Approximation difficulty: complex Dressing: antibiotic ointment Patient tolerance: Patient tolerated the procedure well with no immediate complications.   (including critical care time)  Labs Reviewed - No data to display Ct Head Wo Contrast  05/07/2012  *RADIOLOGY REPORT*  Clinical Data:  Larey Seat.  Hit forehead.  CT HEAD WITHOUT CONTRAST CT CERVICAL SPINE WITHOUT CONTRAST  Technique:  Multidetector CT imaging of the head and cervical spine was performed following the standard protocol without intravenous contrast.  Multiplanar CT image reconstructions of the cervical spine were also generated.  Comparison:  04/29/2012.  CT HEAD  Findings: The ventricles are normal.  No extra-axial fluid collections are seen.  The brainstem and cerebellum are unremarkable.  No acute intracranial findings such as infarction or hemorrhage.  No mass lesions.  The bony calvarium is intact. There is a left parietal scalp hematoma and laceration with skin staples.  No underlying skull fracture.  The visualized paranasal sinuses and mastoid air cells are clear.  IMPRESSION:  1.  No acute intracranial findings or acute skull  fracture. 2.  Left parietal scalp hematoma.  CT CERVICAL SPINE  Findings: Stable severe degenerative cervical spondylosis with multilevel disc disease and facet disease.  Stable degenerative anterior subluxations of C3 and C4.  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  The spinal canal is generous.  No canal compromise.  No acute fracture or abnormal prevertebral soft tissue swelling.  The skull base C1 and C1-2 articulations are maintained.  The lung apices are clear.  IMPRESSION: Stable degenerative cervical spondylosis with disc disease and facet disease. Stable degenerative subluxations. No acute fracture.  Original Report Authenticated By: P. Loralie Champagne, M.D.   Ct Cervical Spine Wo Contrast  05/07/2012  *RADIOLOGY REPORT*  Clinical Data:  Larey Seat.  Hit forehead.  CT HEAD WITHOUT CONTRAST CT CERVICAL SPINE WITHOUT CONTRAST  Technique:  Multidetector CT imaging of the head and cervical spine was performed following the standard protocol without intravenous contrast.  Multiplanar CT image reconstructions of the cervical spine were also generated.  Comparison:  04/29/2012.  CT HEAD  Findings: The ventricles are normal.  No extra-axial fluid collections are seen.  The brainstem and cerebellum are unremarkable.  No acute intracranial findings such as infarction or hemorrhage.  No mass lesions.  The bony calvarium is intact. There is a left parietal scalp hematoma and laceration with skin staples.  No underlying skull fracture.  The visualized paranasal sinuses and mastoid air cells are clear.  IMPRESSION:  1.  No acute intracranial findings or acute skull fracture. 2.  Left parietal scalp hematoma.  CT CERVICAL SPINE  Findings: Stable severe degenerative cervical spondylosis with multilevel disc disease and facet disease.  Stable degenerative anterior subluxations of C3 and C4.  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  The spinal canal is generous.  No canal compromise.  No acute fracture or abnormal  prevertebral soft tissue swelling.  The skull base C1 and C1-2 articulations are maintained.  The lung apices are clear.  IMPRESSION: Stable degenerative cervical spondylosis with disc disease and facet disease. Stable degenerative subluxations. No acute fracture.  Original Report Authenticated By: P. Loralie Champagne, M.D.    Tetanus status confirmed   MDM   Imaging obtained and reviewed as above. Pain medication provided. Wound repair and staple removal as above. Plan outpatient followup for suture removal and followup with neurologist as scheduled.        Sunnie Nielsen, MD 05/07/12 0530

## 2012-05-07 NOTE — ED Notes (Signed)
Per EMS, pt. Fell 2 weeks ago and received stitches for laceration on left forehead, stiches were removed 05/02/12.  Pt. Fell unwitnessed tonight getting out of bed.  Pt was found on floor and laceration from previous fall was bleeding. Pt. Has no other complaints. Pt. Alert and oriented X4. Denies pain.

## 2012-05-07 NOTE — Discharge Instructions (Signed)

## 2012-05-07 NOTE — ED Notes (Signed)
[facility] CALLED BY SECRETARY TO TRANSPORT PT. BACK TO [facility].

## 2012-05-07 NOTE — ED Notes (Signed)
Pt. States "I was getting out of bed, got dizzy, and fell".  Laceration on forehead above left eye. Per daughter, pt. Has been having dizziness since March and has a hx of falls. Pt. Has staples on left side of head behind ear from previous fall. Denies pain. Per daughter, pt was given tramadol at 2200. Pt. Resting comfortably currently.

## 2012-05-07 NOTE — ED Notes (Signed)
REPORT GIVEN TO [facility] RN AT [facility] ON PT'S DISCHARGE.

## 2012-10-19 ENCOUNTER — Emergency Department (HOSPITAL_COMMUNITY): Payer: PRIVATE HEALTH INSURANCE

## 2012-10-19 ENCOUNTER — Emergency Department (HOSPITAL_COMMUNITY)
Admission: EM | Admit: 2012-10-19 | Discharge: 2012-10-19 | Disposition: A | Discharge status: Home / Self Care | Payer: PRIVATE HEALTH INSURANCE | Attending: Emergency Medicine | Admitting: Emergency Medicine

## 2012-10-19 ENCOUNTER — Encounter (HOSPITAL_COMMUNITY): Payer: Self-pay | Admitting: Emergency Medicine

## 2012-10-19 DIAGNOSIS — E7800 Pure hypercholesterolemia, unspecified: Secondary | ICD-10-CM | POA: Insufficient documentation

## 2012-10-19 DIAGNOSIS — J449 Chronic obstructive pulmonary disease, unspecified: Secondary | ICD-10-CM | POA: Insufficient documentation

## 2012-10-19 DIAGNOSIS — M949 Disorder of cartilage, unspecified: Secondary | ICD-10-CM | POA: Insufficient documentation

## 2012-10-19 DIAGNOSIS — I10 Essential (primary) hypertension: Secondary | ICD-10-CM | POA: Insufficient documentation

## 2012-10-19 DIAGNOSIS — F0390 Unspecified dementia without behavioral disturbance: Secondary | ICD-10-CM | POA: Insufficient documentation

## 2012-10-19 DIAGNOSIS — Z79899 Other long term (current) drug therapy: Secondary | ICD-10-CM | POA: Insufficient documentation

## 2012-10-19 DIAGNOSIS — E039 Hypothyroidism, unspecified: Secondary | ICD-10-CM | POA: Insufficient documentation

## 2012-10-19 DIAGNOSIS — M549 Dorsalgia, unspecified: Secondary | ICD-10-CM | POA: Insufficient documentation

## 2012-10-19 DIAGNOSIS — M129 Arthropathy, unspecified: Secondary | ICD-10-CM | POA: Insufficient documentation

## 2012-10-19 DIAGNOSIS — J4489 Other specified chronic obstructive pulmonary disease: Secondary | ICD-10-CM | POA: Insufficient documentation

## 2012-10-19 DIAGNOSIS — R4182 Altered mental status, unspecified: Secondary | ICD-10-CM | POA: Insufficient documentation

## 2012-10-19 DIAGNOSIS — M899 Disorder of bone, unspecified: Secondary | ICD-10-CM | POA: Insufficient documentation

## 2012-10-19 DIAGNOSIS — M1990 Unspecified osteoarthritis, unspecified site: Secondary | ICD-10-CM | POA: Insufficient documentation

## 2012-10-19 DIAGNOSIS — K5900 Constipation, unspecified: Secondary | ICD-10-CM | POA: Insufficient documentation

## 2012-10-19 DIAGNOSIS — Z8669 Personal history of other diseases of the nervous system and sense organs: Secondary | ICD-10-CM | POA: Insufficient documentation

## 2012-10-19 HISTORY — DX: Unspecified convulsions: R56.9

## 2012-10-19 LAB — POCT I-STAT, CHEM 8
BUN: 19 mg/dL (ref 6–23)
Calcium, Ion: 1.25 mmol/L (ref 1.13–1.30)
Chloride: 103 mEq/L (ref 96–112)
Creatinine, Ser: 0.9 mg/dL (ref 0.50–1.10)
Glucose, Bld: 87 mg/dL (ref 70–99)

## 2012-10-19 LAB — URINALYSIS, ROUTINE W REFLEX MICROSCOPIC
Bilirubin Urine: NEGATIVE
Glucose, UA: NEGATIVE mg/dL
Hgb urine dipstick: NEGATIVE
Ketones, ur: NEGATIVE mg/dL
Leukocytes, UA: NEGATIVE
Protein, ur: NEGATIVE mg/dL
pH: 8 (ref 5.0–8.0)

## 2012-10-19 NOTE — ED Notes (Signed)
MD at bedside. 

## 2012-10-19 NOTE — ED Notes (Signed)
Patient transported to CT 

## 2012-10-19 NOTE — ED Notes (Signed)
Pt's NP called; RN spoke with her and gave update on results and POC.

## 2012-10-19 NOTE — ED Notes (Signed)
PT from [place]. LSN at 0600; found unresponsive to pain only by staff; CBG 80; EMS reported VSS but would only respond to sternal rubs. Arrived eyes open and alert. Pt has hx of seizure d/o. No narcotics on med list; being tx for UTI.

## 2012-10-19 NOTE — ED Provider Notes (Signed)
History     CSN: [id number]  Arrival date & time 10/19/12  4098   First MD Initiated Contact with Patient 10/19/12 0940      Chief Complaint  Patient presents with  . Altered Mental Status    (Consider location/radiation/quality/duration/timing/severity/associated sxs/prior treatment) HPI This 78 year old female is brought by EMS for spell of transient unresponsiveness which is now resolved. At baseline the patient is pleasantly demented and confused but he will follow simple commands. At baseline she has generalized weakness and frequent falls with chronic neck pain and chronic back pain. She is often a wheelchair. Today at 6:00 in the morning she was woken up by the nursing staff at the nursing home and given her usual morning medicines. She went back to sleep at 9:00 when the nursing home staff checked and the patient in her bed the patient was found breathing but unresponsive. The nursing home staff were unable to wake up the patient with sternal rub; EMS. Upon EMS arrival the patient woke up and feels fine. The patient thinks she went out to feed the animal in a barn this morning like she would've done in the past prior to her history of dementia. The patient denies headache chest pain shortness breath abdominal pain or other concerns. She is acting at her baseline now. Her family is with her in the ED now and states she looks at her baseline.  Her speech is baseline she is no headache. She is baseline neck and back pain. She is no new focal weakness or numbness or incoordination. There is no known trauma this morning.  There is no history of recent known seizures. She did have recent urinary tract infection.  There is no tongue biting. Past Medical History  Diagnosis Date  . COPD (chronic obstructive pulmonary disease)   . Frequent UTI   . Hypertension   . Arthritis   . Osteoarthritis   . Hypercholesteremia   . Asthma   . Thyroid disease   . Environmental allergies   . Osteopenia     . Macular degeneration   . Hearing loss   . Constipation   . Back pain   . Dementia   . PONV (postoperative nausea and vomiting)   . Hypothyroidism   . Blood transfusion     " no reaction to transfusion "  . Seizures     Past Surgical History  Procedure Date  . Kyphosis surgery   . Spinal fusion   . Rotator cuff repair     unsuccessful x 2  . Bladder surgery     with mesh & vaginal wall    No family history on file.  History  Substance Use Topics  . Smoking status: Never Smoker   . Smokeless tobacco: Never Used  . Alcohol Use: No    OB History    Grav Para Term Preterm Abortions TAB SAB Ect Mult Living                  Review of Systems 10 Systems reviewed and are negative for acute change except as noted in the HPI. Allergies  Codeine; Hyomax; Other; Oxycodone hcl er; and Sulfa antibiotics  Home Medications   Current Outpatient Rx  Name  Route  Sig  Dispense  Refill  . ACETAMINOPHEN 325 MG PO TABS   Oral   Take 325 mg by mouth every 6 (six) hours as needed. For pain         . ACETAMINOPHEN 500 MG PO  TABS   Oral   Take 500 mg by mouth every 8 (eight) hours.         . AMLODIPINE BESYLATE 10 MG PO TABS   Oral   Take 1 tablet (10 mg total) by mouth daily.   30 tablet   0   . OCUVITE PO TABS   Oral   Take 1 tablet by mouth daily.          [name] BISACODYL 10 MG RE SUPP   Rectal   Place 10 mg rectally every other day as needed. For constipation         . BUDESONIDE-FORMOTEROL FUMARATE 160-4.5 MCG/ACT IN AERO   Inhalation   Inhale 2 puffs into the lungs 2 (two) times daily.         [name] CALTRATE 600 PO   Oral   Take by mouth 3 (three) times daily.         . CHLORHEXIDINE GLUCONATE 0.12 % MT SOLN   Mouth/Throat   Use as directed 15 mLs in the mouth or throat at bedtime.         [name] DOCUSATE SODIUM 100 MG PO CAPS   Oral   Take 100 mg by mouth 2 (two) times daily.         . DULOXETINE HCL 60 MG PO CPEP   Oral   Take 60 mg by mouth  daily.         . ERGOCALCIFEROL 50000 UNITS PO CAPS   Oral   Take 50,000 Units by mouth every 30 (thirty) days.         [name] FOLIC ACID 1 MG PO TABS   Oral   Take 1 tablet (1 mg total) by mouth daily.   30 tablet   0   . ATROVENT NA   Nasal   Place 1 spray into the nose 2 (two) times daily.          [name] KETOTIFEN FUMARATE 0.025 % OP SOLN   Both Eyes   Place 1 drop into both eyes 2 (two) times daily.         [name] LEVOTHYROXINE SODIUM 50 MCG PO TABS   Oral   Take 50 mcg by mouth daily.         [name] LIDOCAINE 5 % EX PTCH   Transdermal   Place 1 patch onto the skin daily. Remove & Discard patch within 12 hours or as directed by MD         . LISINOPRIL 5 MG PO TABS   Oral   Take 5 mg by mouth daily.         . LUBIPROSTONE 24 MCG PO CAPS   Oral   Take 24 mcg by mouth 2 (two) times daily with a meal.         . MECLIZINE HCL 12.5 MG PO TABS   Oral   Take 12.5 mg by mouth every 8 (eight) hours as needed. For vertigo         . MELATONIN 3 MG PO TABS   Oral   Take 6 mg by mouth at bedtime.         [name] METOPROLOL SUCCINATE ER 50 MG PO TB24   Oral   Take 50 mg by mouth daily. Take with or immediately following a meal.         . OLANZAPINE 2.5 MG PO TABS   Oral   Take 2.5 mg by mouth 2 (two) times daily.         [name]  OMEGA 3 PO   Oral   Take 2,000 mg by mouth 2 (two) times daily.         [name] OMEPRAZOLE 20 MG PO CPDR   Oral   Take 20 mg by mouth daily.         Frazier Butt OP   Both Eyes   Place 1-2 drops into both eyes 4 (four) times daily.         [name] POLYETHYLENE GLYCOL 3350 PO POWD   Oral   Take 17 g by mouth 2 (two) times daily.          . SENNOSIDES 8.6 MG PO TABS   Oral   Take 1 tablet by mouth 2 (two) times daily as needed. For constipation         . TRAMADOL HCL 50 MG PO TABS   Oral   Take 50 mg by mouth every 6 (six) hours as needed. For pain         . TRAMADOL-ACETAMINOPHEN 37.5-325 MG PO TABS   Oral   Take 1 tablet by mouth every  6 (six) hours as needed. For pain         . VITAMIN A 78295 UNITS PO CAPS   Oral   Take 10,000 Units by mouth 2 (two) times daily.          [name] VITAMIN B-12 1000 MCG PO TABS   Intramuscular   Inject 1,000 mcg into the muscle every 30 (thirty) days. On 15th of each month           BP 184/76  Pulse 62  Temp 98.3 F (36.8 C) (Oral)  Resp 14  SpO2 98%  Physical Exam  Nursing note and vitals reviewed. Constitutional:       Awake, alert, nontoxic appearance with baseline speech for patient.  HENT:  Head: Atraumatic.  Mouth/Throat: No oropharyngeal exudate.  Eyes: EOM are normal. Pupils are equal, round, and reactive to light. Right eye exhibits no discharge. Left eye exhibits no discharge.  Neck: Neck supple.       Cervical spine is baseline minimal tenderness  Cardiovascular: Normal rate and regular rhythm.   No murmur heard. Pulmonary/Chest: Effort normal and breath sounds normal. No stridor. No respiratory distress. She has no wheezes. She has no rales. She exhibits no tenderness.  Abdominal: Soft. Bowel sounds are normal. She exhibits no mass. There is no tenderness. There is no rebound.  Musculoskeletal: She exhibits tenderness.       Baseline ROM, moves extremities with no obvious new focal weakness. Back is baseline minimal diffuse tenderness  Lymphadenopathy:    She has no cervical adenopathy.  Neurological: She is alert.       Awake, alert, cooperative and aware of situation oriented to person place and she believes is either Sunday or Saturday (it is Sunday) in November; motor strength 5 out of 5 bilaterally; sensation normal to light touch bilaterally; peripheral visual fields full to confrontation; no facial asymmetry; tongue midline; major cranial nerves appear intact; no pronator drift arms or legs, normal finger to nose bilaterally  Skin: No rash noted.  Psychiatric: She has a normal mood and affect.    ED Course  Procedures (including critical care  time)   Labs Reviewed  URINALYSIS, ROUTINE W REFLEX MICROSCOPIC  POCT I-STAT, CHEM 8  LAB REPORT - SCANNED   No results found.   1. Altered mental status   2. Dementia       MDM  Pt  stable in ED with no significant deterioration in condition.  Patient / Family / Caregiver informed of clinical course, understand medical decision-making process, and agree with plan.         Hurman Horn, MD 10/22/12 [phone]

## 2012-10-19 NOTE — ED Notes (Signed)
Brief changed ?

## 2012-10-19 NOTE — ED Notes (Signed)
PT ambulated with baseline gait; VSS; A&Ox3; no signs of distress; respirations even and unlabored; skin warm and dry; no questions upon discharge.  

## 2012-10-19 NOTE — ED Notes (Signed)
Pt returned; back on monitor. No signs of distress.

## 2012-10-19 NOTE — ED Notes (Signed)
Pt placed in fresh depends and paper scrub pants.

## 2013-01-02 ENCOUNTER — Emergency Department (HOSPITAL_COMMUNITY): Payer: Medicare Other

## 2013-01-02 ENCOUNTER — Inpatient Hospital Stay (HOSPITAL_COMMUNITY)
Admission: EM | Admit: 2013-01-02 | Discharge: 2013-01-31 | DRG: 871 | Disposition: E | Payer: Medicare Other | Attending: Internal Medicine | Admitting: Internal Medicine

## 2013-01-02 DIAGNOSIS — G9340 Encephalopathy, unspecified: Secondary | ICD-10-CM | POA: Diagnosis present

## 2013-01-02 DIAGNOSIS — A0472 Enterocolitis due to Clostridium difficile, not specified as recurrent: Secondary | ICD-10-CM | POA: Diagnosis present

## 2013-01-02 DIAGNOSIS — E860 Dehydration: Secondary | ICD-10-CM

## 2013-01-02 DIAGNOSIS — Z515 Encounter for palliative care: Secondary | ICD-10-CM

## 2013-01-02 DIAGNOSIS — E870 Hyperosmolality and hypernatremia: Secondary | ICD-10-CM | POA: Diagnosis present

## 2013-01-02 DIAGNOSIS — J449 Chronic obstructive pulmonary disease, unspecified: Secondary | ICD-10-CM

## 2013-01-02 DIAGNOSIS — F411 Generalized anxiety disorder: Secondary | ICD-10-CM | POA: Diagnosis present

## 2013-01-02 DIAGNOSIS — F0390 Unspecified dementia without behavioral disturbance: Secondary | ICD-10-CM | POA: Diagnosis present

## 2013-01-02 DIAGNOSIS — N179 Acute kidney failure, unspecified: Secondary | ICD-10-CM

## 2013-01-02 DIAGNOSIS — R6521 Severe sepsis with septic shock: Secondary | ICD-10-CM

## 2013-01-02 DIAGNOSIS — N170 Acute kidney failure with tubular necrosis: Secondary | ICD-10-CM | POA: Diagnosis present

## 2013-01-02 DIAGNOSIS — Z6826 Body mass index (BMI) 26.0-26.9, adult: Secondary | ICD-10-CM

## 2013-01-02 DIAGNOSIS — D72829 Elevated white blood cell count, unspecified: Secondary | ICD-10-CM | POA: Diagnosis present

## 2013-01-02 DIAGNOSIS — R21 Rash and other nonspecific skin eruption: Secondary | ICD-10-CM | POA: Diagnosis present

## 2013-01-02 DIAGNOSIS — Z66 Do not resuscitate: Secondary | ICD-10-CM | POA: Diagnosis not present

## 2013-01-02 DIAGNOSIS — R627 Adult failure to thrive: Secondary | ICD-10-CM | POA: Diagnosis present

## 2013-01-02 DIAGNOSIS — D649 Anemia, unspecified: Secondary | ICD-10-CM | POA: Diagnosis present

## 2013-01-02 DIAGNOSIS — E876 Hypokalemia: Secondary | ICD-10-CM | POA: Diagnosis present

## 2013-01-02 DIAGNOSIS — E8809 Other disorders of plasma-protein metabolism, not elsewhere classified: Secondary | ICD-10-CM | POA: Diagnosis present

## 2013-01-02 DIAGNOSIS — A419 Sepsis, unspecified organism: Secondary | ICD-10-CM | POA: Diagnosis present

## 2013-01-02 DIAGNOSIS — I619 Nontraumatic intracerebral hemorrhage, unspecified: Secondary | ICD-10-CM | POA: Diagnosis present

## 2013-01-02 DIAGNOSIS — E878 Other disorders of electrolyte and fluid balance, not elsewhere classified: Secondary | ICD-10-CM | POA: Diagnosis present

## 2013-01-02 DIAGNOSIS — A499 Bacterial infection, unspecified: Secondary | ICD-10-CM | POA: Diagnosis present

## 2013-01-02 DIAGNOSIS — R0600 Dyspnea, unspecified: Secondary | ICD-10-CM

## 2013-01-02 DIAGNOSIS — J4489 Other specified chronic obstructive pulmonary disease: Secondary | ICD-10-CM | POA: Diagnosis present

## 2013-01-02 DIAGNOSIS — M129 Arthropathy, unspecified: Secondary | ICD-10-CM | POA: Diagnosis present

## 2013-01-02 DIAGNOSIS — E43 Unspecified severe protein-calorie malnutrition: Secondary | ICD-10-CM | POA: Diagnosis present

## 2013-01-02 DIAGNOSIS — R609 Edema, unspecified: Secondary | ICD-10-CM | POA: Diagnosis not present

## 2013-01-02 DIAGNOSIS — E872 Acidosis, unspecified: Secondary | ICD-10-CM | POA: Diagnosis present

## 2013-01-02 DIAGNOSIS — H9190 Unspecified hearing loss, unspecified ear: Secondary | ICD-10-CM | POA: Diagnosis present

## 2013-01-02 DIAGNOSIS — R188 Other ascites: Secondary | ICD-10-CM | POA: Diagnosis present

## 2013-01-02 DIAGNOSIS — D696 Thrombocytopenia, unspecified: Secondary | ICD-10-CM | POA: Diagnosis present

## 2013-01-02 DIAGNOSIS — E875 Hyperkalemia: Secondary | ICD-10-CM | POA: Diagnosis not present

## 2013-01-02 DIAGNOSIS — Z8619 Personal history of other infectious and parasitic diseases: Secondary | ICD-10-CM | POA: Diagnosis present

## 2013-01-02 DIAGNOSIS — I959 Hypotension, unspecified: Secondary | ICD-10-CM | POA: Diagnosis present

## 2013-01-02 DIAGNOSIS — H3530 Unspecified macular degeneration: Secondary | ICD-10-CM | POA: Diagnosis present

## 2013-01-02 DIAGNOSIS — I10 Essential (primary) hypertension: Secondary | ICD-10-CM | POA: Diagnosis present

## 2013-01-02 DIAGNOSIS — R4182 Altered mental status, unspecified: Secondary | ICD-10-CM | POA: Diagnosis present

## 2013-01-02 DIAGNOSIS — A414 Sepsis due to anaerobes: Secondary | ICD-10-CM | POA: Diagnosis present

## 2013-01-02 DIAGNOSIS — M1990 Unspecified osteoarthritis, unspecified site: Secondary | ICD-10-CM | POA: Diagnosis present

## 2013-01-02 DIAGNOSIS — E7800 Pure hypercholesterolemia, unspecified: Secondary | ICD-10-CM | POA: Diagnosis present

## 2013-01-02 DIAGNOSIS — Z87440 Personal history of urinary (tract) infections: Secondary | ICD-10-CM

## 2013-01-02 DIAGNOSIS — R197 Diarrhea, unspecified: Secondary | ICD-10-CM | POA: Diagnosis present

## 2013-01-02 DIAGNOSIS — E039 Hypothyroidism, unspecified: Secondary | ICD-10-CM | POA: Diagnosis present

## 2013-01-02 LAB — BASIC METABOLIC PANEL
CO2: 17 mEq/L — ABNORMAL LOW (ref 19–32)
Calcium: 8.7 mg/dL (ref 8.4–10.5)
GFR calc non Af Amer: 7 mL/min — ABNORMAL LOW (ref >90)
Sodium: 142 mEq/L (ref 135–145)

## 2013-01-02 LAB — CBC WITH DIFFERENTIAL/PLATELET
Band Neutrophils: 0 % (ref 0–10)
Blasts: 0 %
Eosinophils Absolute: 0 10*3/uL (ref 0.0–0.7)
HCT: 41.8 % (ref 36.0–46.0)
MCH: 30.5 pg (ref 26.0–34.0)
MCV: 89.9 fL (ref 78.0–100.0)
Metamyelocytes Relative: 0 %
Monocytes Absolute: 1.3 10*3/uL — ABNORMAL HIGH (ref 0.1–1.0)
Monocytes Relative: 6 % (ref 3–12)
Myelocytes: 0 %
RDW: 16 % — ABNORMAL HIGH (ref 11.5–15.5)
WBC Morphology: INCREASED
WBC: 21.7 10*3/uL — ABNORMAL HIGH (ref 4.0–10.5)

## 2013-01-02 MED ORDER — SODIUM CHLORIDE 0.9 % IV BOLUS (SEPSIS)
1000.0000 mL | Freq: Once | INTRAVENOUS | Status: AC
  Administered 2013-01-03: 1000 mL via INTRAVENOUS

## 2013-01-02 NOTE — ED Notes (Signed)
Pt from [place]; according to family, pt has altered mental status; pt normally alert and following commands; today pt found to be out of it and not following commands; pt has also had decreased oral intake over the past week; 2 weeks ago pt had a UTI and was treated at facility with antibiotics; pt is presenting today with diarrhea and is warm to touch with hypotension with BP in the 80's and 90's with EMS; IV access x2 with EMS with no success; CBG 158, HR 80's; no obvious signs of stroke with EMS; Daughter at bedside;

## 2013-01-03 ENCOUNTER — Inpatient Hospital Stay (HOSPITAL_COMMUNITY): Payer: Medicare Other

## 2013-01-03 ENCOUNTER — Encounter (HOSPITAL_COMMUNITY): Payer: Self-pay | Admitting: Internal Medicine

## 2013-01-03 DIAGNOSIS — R197 Diarrhea, unspecified: Secondary | ICD-10-CM

## 2013-01-03 DIAGNOSIS — Z8619 Personal history of other infectious and parasitic diseases: Secondary | ICD-10-CM | POA: Diagnosis present

## 2013-01-03 DIAGNOSIS — I959 Hypotension, unspecified: Secondary | ICD-10-CM | POA: Diagnosis present

## 2013-01-03 DIAGNOSIS — G9340 Encephalopathy, unspecified: Secondary | ICD-10-CM | POA: Diagnosis present

## 2013-01-03 DIAGNOSIS — N179 Acute kidney failure, unspecified: Secondary | ICD-10-CM

## 2013-01-03 DIAGNOSIS — E860 Dehydration: Secondary | ICD-10-CM

## 2013-01-03 DIAGNOSIS — R4182 Altered mental status, unspecified: Secondary | ICD-10-CM

## 2013-01-03 DIAGNOSIS — R21 Rash and other nonspecific skin eruption: Secondary | ICD-10-CM | POA: Diagnosis present

## 2013-01-03 LAB — CBC
Hemoglobin: 13.1 g/dL (ref 12.0–15.0)
MCHC: 34.5 g/dL (ref 30.0–36.0)

## 2013-01-03 LAB — POCT I-STAT 3, ART BLOOD GAS (G3+)
pCO2 arterial: 27.9 mmHg — ABNORMAL LOW (ref 35.0–45.0)
pH, Arterial: 7.436 (ref 7.350–7.450)

## 2013-01-03 LAB — COMPREHENSIVE METABOLIC PANEL
ALT: 13 U/L (ref 0–35)
CO2: 16 mEq/L — ABNORMAL LOW (ref 19–32)
Calcium: 8.3 mg/dL — ABNORMAL LOW (ref 8.4–10.5)
Creatinine, Ser: 5.44 mg/dL — ABNORMAL HIGH (ref 0.50–1.10)
GFR calc Af Amer: 7 mL/min — ABNORMAL LOW (ref >90)
GFR calc non Af Amer: 6 mL/min — ABNORMAL LOW (ref >90)
Glucose, Bld: 144 mg/dL — ABNORMAL HIGH (ref 70–99)

## 2013-01-03 LAB — VALPROIC ACID LEVEL: Valproic Acid Lvl: <10 ug/mL — ABNORMAL LOW (ref 50.0–100.0)

## 2013-01-03 LAB — LACTIC ACID, PLASMA: Lactic Acid, Venous: 3.3 mmol/L — ABNORMAL HIGH (ref 0.5–2.2)

## 2013-01-03 LAB — MRSA PCR SCREENING: MRSA by PCR: NEGATIVE

## 2013-01-03 LAB — CLOSTRIDIUM DIFFICILE BY PCR: Toxigenic C. Difficile by PCR: POSITIVE — AB

## 2013-01-03 MED ORDER — SODIUM CHLORIDE 0.9 % IJ SOLN
3.0000 mL | Freq: Two times a day (BID) | INTRAMUSCULAR | Status: DC
  Administered 2013-01-03 – 2013-01-04 (×3): 3 mL via INTRAVENOUS
  Administered 2013-01-04: 6 mL via INTRAVENOUS
  Administered 2013-01-05 – 2013-01-07 (×4): 3 mL via INTRAVENOUS

## 2013-01-03 MED ORDER — IPRATROPIUM BROMIDE 0.06 % NA SOLN
2.0000 | Freq: Two times a day (BID) | NASAL | Status: DC
  Administered 2013-01-03 – 2013-01-20 (×27): 2 via NASAL
  Filled 2013-01-03 (×4): qty 15

## 2013-01-03 MED ORDER — IPRATROPIUM BROMIDE 0.03 % NA SOLN
1.0000 | Freq: Two times a day (BID) | NASAL | Status: DC
  Filled 2013-01-03: qty 30

## 2013-01-03 MED ORDER — OLOPATADINE HCL 0.1 % OP SOLN
1.0000 [drp] | Freq: Two times a day (BID) | OPHTHALMIC | Status: DC
  Administered 2013-01-03 – 2013-01-20 (×33): 1 [drp] via OPHTHALMIC
  Filled 2013-01-03 (×3): qty 5

## 2013-01-03 MED ORDER — SODIUM CHLORIDE 0.9 % IV SOLN: INTRAVENOUS | Status: DC

## 2013-01-03 MED ORDER — GERHARDT'S BUTT CREAM
TOPICAL_CREAM | Freq: Three times a day (TID) | CUTANEOUS | Status: DC
  Administered 2013-01-03 – 2013-01-04 (×5): via TOPICAL
  Administered 2013-01-04: 1 via TOPICAL
  Administered 2013-01-05 – 2013-01-15 (×30): via TOPICAL
  Administered 2013-01-15: 1 via TOPICAL
  Administered 2013-01-15 – 2013-01-21 (×18): via TOPICAL
  Filled 2013-01-03 (×5): qty 1

## 2013-01-03 MED ORDER — LEVOTHYROXINE SODIUM 50 MCG PO TABS
50.0000 ug | ORAL_TABLET | Freq: Every day | ORAL | Status: DC
  Administered 2013-01-03 – 2013-01-08 (×2): 50 ug via ORAL
  Filled 2013-01-03 (×7): qty 1

## 2013-01-03 MED ORDER — FOLIC ACID 1 MG PO TABS
1.0000 mg | ORAL_TABLET | Freq: Every day | ORAL | Status: DC
  Administered 2013-01-03 – 2013-01-08 (×2): 1 mg via ORAL
  Filled 2013-01-03 (×7): qty 1

## 2013-01-03 MED ORDER — METRONIDAZOLE IN NACL 5-0.79 MG/ML-% IV SOLN
500.0000 mg | Freq: Three times a day (TID) | INTRAVENOUS | Status: DC
  Administered 2013-01-03 (×3): 500 mg via INTRAVENOUS
  Filled 2013-01-03 (×4): qty 100

## 2013-01-03 MED ORDER — ENOXAPARIN SODIUM 40 MG/0.4ML ~~LOC~~ SOLN
40.0000 mg | SUBCUTANEOUS | Status: DC
  Administered 2013-01-03: 40 mg via SUBCUTANEOUS
  Filled 2013-01-03 (×2): qty 0.4

## 2013-01-03 MED ORDER — RISAQUAD PO CAPS
1.0000 | ORAL_CAPSULE | Freq: Two times a day (BID) | ORAL | Status: DC
  Administered 2013-01-03 – 2013-01-08 (×6): 1 via ORAL
  Filled 2013-01-03 (×15): qty 1

## 2013-01-03 MED ORDER — OLANZAPINE 2.5 MG PO TABS
2.5000 mg | ORAL_TABLET | Freq: Two times a day (BID) | ORAL | Status: DC
  Administered 2013-01-03 – 2013-01-08 (×6): 2.5 mg via ORAL
  Filled 2013-01-03 (×15): qty 1

## 2013-01-03 MED ORDER — DIVALPROEX SODIUM 125 MG PO CPSP
250.0000 mg | ORAL_CAPSULE | Freq: Every day | ORAL | Status: DC
  Administered 2013-01-03 – 2013-01-06 (×3): 250 mg via ORAL
  Filled 2013-01-03 (×5): qty 2

## 2013-01-03 MED ORDER — ACETAMINOPHEN 650 MG RE SUPP
650.0000 mg | Freq: Four times a day (QID) | RECTAL | Status: DC | PRN
  Administered 2013-01-06 – 2013-01-20 (×8): 650 mg via RECTAL
  Filled 2013-01-03 (×10): qty 1

## 2013-01-03 MED ORDER — ACETAMINOPHEN 325 MG PO TABS
650.0000 mg | ORAL_TABLET | Freq: Four times a day (QID) | ORAL | Status: DC | PRN
  Administered 2013-01-05: 650 mg via ORAL
  Filled 2013-01-03 (×2): qty 2

## 2013-01-03 MED ORDER — ONDANSETRON HCL 4 MG/2ML IJ SOLN: 4.0000 mg | Freq: Four times a day (QID) | INTRAMUSCULAR | Status: DC | PRN

## 2013-01-03 MED ORDER — ONDANSETRON HCL 4 MG PO TABS: 4.0000 mg | ORAL_TABLET | Freq: Four times a day (QID) | ORAL | Status: DC | PRN

## 2013-01-03 MED ORDER — KETOTIFEN FUMARATE 0.025 % OP SOLN: 1.0000 [drp] | Freq: Two times a day (BID) | OPHTHALMIC | Status: DC

## 2013-01-03 MED ORDER — BUDESONIDE-FORMOTEROL FUMARATE 160-4.5 MCG/ACT IN AERO
2.0000 | INHALATION_SPRAY | Freq: Two times a day (BID) | RESPIRATORY_TRACT | Status: DC
  Administered 2013-01-03 – 2013-01-18 (×25): 2 via RESPIRATORY_TRACT
  Filled 2013-01-03 (×4): qty 6

## 2013-01-03 MED ORDER — DULOXETINE HCL 60 MG PO CPEP
60.0000 mg | ORAL_CAPSULE | Freq: Every day | ORAL | Status: DC
  Administered 2013-01-03: 60 mg via ORAL
  Filled 2013-01-03 (×5): qty 1

## 2013-01-03 MED ORDER — SODIUM CHLORIDE 0.9 % IV SOLN
INTRAVENOUS | Status: DC
  Administered 2013-01-03 – 2013-01-04 (×2): via INTRAVENOUS

## 2013-01-03 NOTE — Progress Notes (Signed)
Clarified with Dr. Adela Glimpse on bed status. MD would like an EL[employer] camera bed. Informed MD that pt will have to be moved to accommodate, MD verbalized understanding. Family updated and verbalized understanding.

## 2013-01-03 NOTE — ED Provider Notes (Signed)
History     CSN: [id number]  Arrival date & time 2013/01/19  2139   First MD Initiated Contact with Patient 19-Jan-2013 2153      Chief Complaint  Patient presents with  . Altered Mental Status    (Consider location/radiation/quality/duration/timing/severity/associated sxs/prior treatment) Patient is a 79 y.o. female presenting with altered mental status. The history is provided by a relative (pt has had diarhea and confusion for a couple days). No language interpreter was used.  Altered Mental Status This is a new problem. The current episode started more than 2 days ago. The problem occurs constantly. The problem has not changed since onset.Pertinent negatives include no chest pain and no abdominal pain. Nothing aggravates the symptoms. Nothing relieves the symptoms.    Past Medical History  Diagnosis Date  . COPD (chronic obstructive pulmonary disease)   . Frequent UTI   . Hypertension   . Arthritis   . Osteoarthritis   . Hypercholesteremia   . Asthma   . Thyroid disease   . Environmental allergies   . Osteopenia   . Macular degeneration   . Hearing loss   . Constipation   . Back pain   . Dementia   . PONV (postoperative nausea and vomiting)   . Hypothyroidism   . Blood transfusion     " no reaction to transfusion "  . Seizures     Past Surgical History  Procedure Date  . Kyphosis surgery   . Spinal fusion   . Rotator cuff repair     unsuccessful x 2  . Bladder surgery     with mesh & vaginal wall    No family history on file.  History  Substance Use Topics  . Smoking status: Never Smoker   . Smokeless tobacco: Never Used  . Alcohol Use: No    OB History    Grav Para Term Preterm Abortions TAB SAB Ect Mult Living                  Review of Systems  Unable to perform ROS: Dementia  Cardiovascular: Negative for chest pain.  Gastrointestinal: Negative for abdominal pain.  Psychiatric/Behavioral: Positive for altered mental status.    Allergies   Codeine; Hyomax; Other; Oxycodone hcl er; and Sulfa antibiotics  Home Medications   Current Outpatient Rx  Name  Route  Sig  Dispense  Refill  . ACETAMINOPHEN 500 MG PO TABS   Oral   Take 500 mg by mouth every 8 (eight) hours.         . AMLODIPINE BESYLATE 10 MG PO TABS   Oral   Take 1 tablet (10 mg total) by mouth daily.   30 tablet   0   . OCUVITE PO TABS   Oral   Take 1 tablet by mouth daily.          . BUDESONIDE-FORMOTEROL FUMARATE 160-4.5 MCG/ACT IN AERO   Inhalation   Inhale 2 puffs into the lungs 2 (two) times daily.         [name] CALTRATE 600 PO   Oral   Take 600 mg by mouth 3 (three) times daily.          . CHLORHEXIDINE GLUCONATE 0.12 % MT SOLN   Mouth/Throat   Use as directed 15 mLs in the mouth or throat at bedtime.         . ESTROGENS, CONJUGATED 0.625 MG/GM VA CREA   Vaginal   Place 0.5 g vaginally once a week. On  Saturdays         . DICLOFENAC SODIUM 1 % TD GEL   Topical   Apply 1 application topically 4 (four) times daily. Massage into painful joints         . DIVALPROEX SODIUM 125 MG PO CPSP   Oral   Take 250 mg by mouth at bedtime.         [name] DOCUSATE SODIUM 100 MG PO CAPS   Oral   Take 100 mg by mouth 2 (two) times daily.         . DULOXETINE HCL 60 MG PO CPEP   Oral   Take 60 mg by mouth daily.         [name] FOLIC ACID 1 MG PO TABS   Oral   Take 1 tablet (1 mg total) by mouth daily.   30 tablet   0   . IPRATROPIUM BROMIDE 0.03 % NA SOLN   Nasal   Place 1 spray into the nose 2 (two) times daily.         [name] KETOTIFEN FUMARATE 0.025 % OP SOLN   Both Eyes   Place 1 drop into both eyes 2 (two) times daily.         [name] LEVOTHYROXINE SODIUM 50 MCG PO TABS   Oral   Take 50 mcg by mouth daily.         [name] LIDOCAINE 5 % EX PTCH   Transdermal   Place 1 patch onto the skin daily. Remove & Discard patch within 12 hours or as directed by MD         . LUBIPROSTONE 24 MCG PO CAPS   Oral   Take 24 mcg by mouth 2 (two)  times daily with a meal.         . MELATONIN 3 MG PO TABS   Oral   Take 6 mg by mouth at bedtime.         [name] METOPROLOL SUCCINATE ER 50 MG PO TB24   Oral   Take 50 mg by mouth daily. Take with or immediately following a meal.         . BOOST PLUS PO   Oral   Take 1 Bottle by mouth 2 (two) times daily.         [name] OLANZAPINE 2.5 MG PO TABS   Oral   Take 2.5 mg by mouth 2 (two) times daily.         . OMEGA 3 1000 MG PO CAPS   Oral   Take 2,000 mg by mouth 2 (two) times daily.         [name] OMEPRAZOLE 20 MG PO CPDR   Oral   Take 20 mg by mouth daily with lunch.          Frazier Butt OP   Both Eyes   Place 1-2 drops into both eyes 4 (four) times daily.         [name] POLYETHYLENE GLYCOL 3350 PO POWD   Oral   Take 17 g by mouth 2 (two) times daily.          [name] PROBIOTIC DAILY PO   Oral   Take 1 capsule by mouth 2 (two) times daily.         [name] RANITIDINE HCL 300 MG PO TABS   Oral   Take 300 mg by mouth at bedtime.         . TRAMADOL-ACETAMINOPHEN 37.5-325 MG PO TABS   Oral  Take 1 tablet by mouth 2 (two) times daily. For pain         . VITAMIN A 16109 UNITS PO CAPS   Oral   Take 10,000 Units by mouth 2 (two) times daily.            BP 97/78  Pulse 87  Temp 97.5 F (36.4 C) (Oral)  Resp 20  SpO2 100%  Physical Exam  Constitutional: She appears well-developed.  HENT:  Head: Normocephalic and atraumatic.  Eyes: Conjunctivae normal and EOM are normal. No scleral icterus.  Neck: Neck supple. No thyromegaly present.  Cardiovascular: Normal rate and regular rhythm.  Exam reveals no gallop and no friction rub.   No murmur heard. Pulmonary/Chest: No stridor. She has no wheezes. She has no rales. She exhibits no tenderness.  Abdominal: She exhibits no distension. There is no tenderness. There is no rebound.  Musculoskeletal: Normal range of motion. She exhibits no edema.  Lymphadenopathy:    She has no cervical adenopathy.  Neurological: Coordination  normal.       Lethargic and responds to painful stimuli  Skin: No rash noted. No erythema.  Psychiatric: She has a normal mood and affect. Her behavior is normal.    ED Course  Procedures (including critical care time)  Labs Reviewed  CBC WITH DIFFERENTIAL - Abnormal; Notable for the following:    WBC 21.7 (*)     RDW 16.0 (*)     Neutrophils Relative 90 (*)     Lymphocytes Relative 4 (*)     Neutro Abs 19.5 (*)     Monocytes Absolute 1.3 (*)     All other components within normal limits  BASIC METABOLIC PANEL - Abnormal; Notable for the following:    Potassium 5.2 (*)     CO2 17 (*)     Glucose, Bld 195 (*)     BUN 93 (*)     Creatinine, Ser 5.31 (*)     GFR calc non Af Amer 7 (*)     GFR calc Af Amer 8 (*)     All other components within normal limits  URINALYSIS, MICROSCOPIC ONLY  URINE CULTURE  CLOSTRIDIUM DIFFICILE BY PCR   Dg Chest Port 1 View  Jan 13, 2013  *RADIOLOGY REPORT*  Clinical Data: Shortness of breath.  Hypotension.  Low oxygen saturation.  PORTABLE CHEST - 1 VIEW  Comparison: 04/10/2012  Findings: Shallow inspiration. The heart size and pulmonary vascularity are normal. The lungs appear clear and expanded without focal air space disease or consolidation. No blunting of the costophrenic angles.  No pneumothorax.  Mediastinal contours appear intact.  Calcified and tortuous aorta.  Severe degenerative changes in both shoulders with resorption of the distal clavicles. Postoperative change in the left shoulder.  IMPRESSION: No evidence of active pulmonary disease.   Original Report Authenticated By: Burman Nieves, M.D.      1. Dehydration   2. Altered mental status     CRITICAL CARE Performed by: Othella Slappey L   Total critical care time:40  Critical care time was exclusive of separately billable procedures and treating other patients.  Critical care was necessary to treat or prevent imminent or life-threatening deterioration.  Critical care was time  spent personally by me on the following activities: development of treatment plan with patient and/or surrogate as well as nursing, discussions with consultants, evaluation of patient's response to treatment, examination of patient, obtaining history from patient or surrogate, ordering and performing treatments and interventions, ordering and review of  laboratory studies, ordering and review of radiographic studies, pulse oximetry and re-evaluation of patient's condition.   MDM          Benny Lennert, MD 01/03/13 [phone]

## 2013-01-03 NOTE — H&P (Signed)
PCP:  Terald Sleeper, MD    Chief Complaint:  Diarrhea  HPI: Shirley Bond is a 79 y.o. female   has a past medical history of COPD (chronic obstructive pulmonary disease); Frequent UTI; Hypertension; Arthritis; Osteoarthritis; Hypercholesteremia; Asthma; Thyroid disease; Environmental allergies; Osteopenia; Macular degeneration; Hearing loss; Constipation; Back pain; Dementia; PONV (postoperative nausea and vomiting); Hypothyroidism; Blood transfusion; and Seizures.   Presented with  2 week hx of feeling poorly at first she was diagnosed with UTI and was treated with IM antibiotics. For 1 week she developed diarrhea with frequent episodes. She has not been eating anything for the past 5 days. Her mental status has declined and she has been becoming progressively more confused. At this point patient is minimally responsive moaning to physical stimulation. In ER she was found to be dehydrated with Cr of 5.3 and WBC of 21.7. Hospitalist service to admit for IV rehydration and diarrhea work up.  Of note her facility had a scabies outbreak in the Summer she had a rash on her thighs for prolonged period of time that eventually was diagnosed as scabies and treated with full resolution. She now have perineal  rash noted but this likely occurred since development of severe diarrhea.  She has hx of esophageal stricture and gets dilatations done about once a year. Her last one was about 1 year ago.   Review of Systems:     Pertinent positives include: chills, abdominal pain, diarrhea  Constitutional:  No weight loss, night sweats, Fevers, ,fatigue, weight loss  HEENT:  No headaches, Difficulty swallowing,Tooth/dental problems,Sore throat,  No sneezing, itching, ear ache, nasal congestion, post nasal drip,  Cardio-vascular:  No chest pain, Orthopnea, PND, anasarca, dizziness, palpitations.no Bilateral lower extremity swelling  GI:  No heartburn, indigestion, nausea, vomiting,  change in  bowel habits, loss of appetite, melena, blood in stool, hematemesis Resp:  no shortness of breath at rest. No dyspnea on exertion, No excess mucus, no productive cough, No non-productive cough, No coughing up of blood.No change in color of mucus.No wheezing. Skin:  no rash or lesions. No jaundice GU:  no dysuria, change in color of urine, no urgency or frequency. No straining to urinate.  No flank pain.  Musculoskeletal:  No joint pain or no joint swelling. No decreased range of motion. No back pain.  Psych:  No change in mood or affect. No depression or anxiety. No memory loss.  Neuro: no localizing neurological complaints, no tingling, no weakness, no double vision, no gait abnormality, no slurred speech, no confusion  Otherwise ROS are negative except for above, 10 systems were reviewed  Past Medical History: Past Medical History  Diagnosis Date  . COPD (chronic obstructive pulmonary disease)   . Frequent UTI   . Hypertension   . Arthritis   . Osteoarthritis   . Hypercholesteremia   . Asthma   . Thyroid disease   . Environmental allergies   . Osteopenia   . Macular degeneration   . Hearing loss   . Constipation   . Back pain   . Dementia   . PONV (postoperative nausea and vomiting)   . Hypothyroidism   . Blood transfusion     " no reaction to transfusion "  . Seizures    Past Surgical History  Procedure Date  . Kyphosis surgery   . Spinal fusion   . Rotator cuff repair     unsuccessful x 2  . Bladder surgery     with mesh & vaginal wall  Medications: Prior to Admission medications   Medication Sig Start Date End Date Taking? Authorizing Provider  acetaminophen (TYLENOL) 500 MG tablet Take 500 mg by mouth every 8 (eight) hours.   Yes Historical Provider, MD  amLODipine (NORVASC) 10 MG tablet Take 1 tablet (10 mg total) by mouth daily. 04/15/12 04/15/13 Yes Simbiso Ranga, MD  beta carotene w/minerals (OCUVITE) tablet Take 1 tablet by mouth daily.    Yes  Historical Provider, MD  budesonide-formoterol (SYMBICORT) 160-4.5 MCG/ACT inhaler Inhale 2 puffs into the lungs 2 (two) times daily.   Yes Historical Provider, MD  Calcium Carbonate (CALTRATE 600 PO) Take 600 mg by mouth 3 (three) times daily.    Yes Historical Provider, MD  chlorhexidine (PERIDEX) 0.12 % solution Use as directed 15 mLs in the mouth or throat at bedtime.   Yes Historical Provider, MD  conjugated estrogens (PREMARIN) vaginal cream Place 0.5 g vaginally once a week. On Saturdays   Yes Historical Provider, MD  diclofenac sodium (VOLTAREN) 1 % GEL Apply 1 application topically 4 (four) times daily. Massage into painful joints   Yes Historical Provider, MD  divalproex (DEPAKOTE SPRINKLE) 125 MG capsule Take 250 mg by mouth at bedtime.   Yes Historical Provider, MD  docusate sodium (COLACE) 100 MG capsule Take 100 mg by mouth 2 (two) times daily.   Yes Historical Provider, MD  DULoxetine (CYMBALTA) 60 MG capsule Take 60 mg by mouth daily.   Yes Historical Provider, MD  folic acid (FOLVITE) 1 MG tablet Take 1 tablet (1 mg total) by mouth daily. 04/15/12  Yes Simbiso Ranga, MD  ipratropium (ATROVENT) 0.03 % nasal spray Place 1 spray into the nose 2 (two) times daily.   Yes Historical Provider, MD  ketotifen (ZADITOR) 0.025 % ophthalmic solution Place 1 drop into both eyes 2 (two) times daily.   Yes Historical Provider, MD  levothyroxine (SYNTHROID, LEVOTHROID) 50 MCG tablet Take 50 mcg by mouth daily.   Yes Historical Provider, MD  lidocaine (LIDODERM) 5 % Place 1 patch onto the skin daily. Remove & Discard patch within 12 hours or as directed by MD   Yes Historical Provider, MD  lubiprostone (AMITIZA) 24 MCG capsule Take 24 mcg by mouth 2 (two) times daily with a meal.   Yes Historical Provider, MD  Melatonin 3 MG TABS Take 6 mg by mouth at bedtime.   Yes Historical Provider, MD  metoprolol succinate (TOPROL-XL) 50 MG 24 hr tablet Take 50 mg by mouth daily. Take with or immediately  following a meal.   Yes Historical Provider, MD  Nutritional Supplements (BOOST PLUS PO) Take 1 Bottle by mouth 2 (two) times daily.   Yes Historical Provider, MD  OLANZapine (ZYPREXA) 2.5 MG tablet Take 2.5 mg by mouth 2 (two) times daily.   Yes Historical Provider, MD  OMEGA 3 1000 MG CAPS Take 2,000 mg by mouth 2 (two) times daily.   Yes Historical Provider, MD  omeprazole (PRILOSEC) 20 MG capsule Take 20 mg by mouth daily with lunch.    Yes Historical Provider, MD  Polyethyl Glycol-Propyl Glycol (SYSTANE OP) Place 1-2 drops into both eyes 4 (four) times daily.   Yes Historical Provider, MD  polyethylene glycol powder (MIRALAX) powder Take 17 g by mouth 2 (two) times daily.    Yes Historical Provider, MD  Probiotic Product (PROBIOTIC DAILY PO) Take 1 capsule by mouth 2 (two) times daily.   Yes Historical Provider, MD  ranitidine (ZANTAC) 300 MG tablet Take 300 mg by mouth  at bedtime.   Yes Historical Provider, MD  traMADol-acetaminophen (ULTRACET) 37.5-325 MG per tablet Take 1 tablet by mouth 2 (two) times daily. For pain   Yes Historical Provider, MD  vitamin A 65784 UNIT capsule Take 10,000 Units by mouth 2 (two) times daily.    Yes Historical Provider, MD    Allergies:   Allergies  Allergen Reactions  . Codeine Other (See Comments)    Unknown.  [name] Hyomax (Hyoscyamine Sulfate) Other (See Comments)    unknown  . Other     Negative reactions to narcotics/opiates  . Oxycodone Hcl Er Other (See Comments)    unknown  . Sulfa Antibiotics Other (See Comments)    unknown    Social History:  For the past week bed bound Lives at [facility]   reports that she has never smoked. She has never used smokeless tobacco. She reports that she does not drink alcohol or use illicit drugs.   Family History: family history includes Cancer in her sister.    Physical Exam: Patient Vitals for the past 24 hrs:  BP Temp Temp src Pulse Resp SpO2  01/03/13 0000 97/78 mmHg - - 87  20   100 %  12/12/2012 2330 96/53 mmHg - - 74  24  96 %  12/03/2012 2300 101/52 mmHg - - 80  27  98 %  12/10/2012 2200 97/68 mmHg - - 110  18  100 %  12/19/2012 2154 104/70 mmHg 97.5 F (36.4 C) Oral 111  33  99 %    1. General:  in No Acute distress 2. Psychological: somnolent and not oriented 3. Head/ENT:   Dry Mucous Membranes                          Head Non traumatic, neck supple                          Normal   Dentition 4. SKIN:   decreased Skin turgor,  Skin clean Dry and intact perineal erythema and occasional satellite lesions consistent with candidal infection but scabies could not be ruled out.  5. Heart: Regular rate and rhythm no Murmur, Rub or gallop 6. Lungs: Clear to auscultation bilaterally, no wheezes or crackles   7. Abdomen: Soft,generaly tender, Non distended 8. Lower extremities: no clubbing, cyanosis, or edema 9. Neurologically Grossly intact, moving all 4 extremities equally 10. MSK: Normal range of motion  body mass index is unknown because there is no height or weight on file.   Labs on Admission:   [facility] 12/05/2012 2212  NA 142  K 5.2*  CL 102  CO2 17*  GLUCOSE 195*  BUN 93*  CREATININE 5.31*  CALCIUM 8.7  MG --  PHOS --   No results found for this basename: AST:2,ALT:2,ALKPHOS:2,BILITOT:2,PROT:2,ALBUMIN:2 in the last 72 hours No results found for this basename: LIPASE:2,AMYLASE:2 in the last 72 hours  Basename 12/27/2012 2212  WBC 21.7*  NEUTROABS 19.5*  HGB 14.2  HCT 41.8  MCV 89.9  PLT 374   No results found for this basename: CKTOTAL:3,CKMB:3,CKMBINDEX:3,TROPONINI:3 in the last 72 hours No results found for this basename: TSH,T4TOTAL,FREET3,T3FREE,THYROIDAB in the last 72 hours No results found for this basename: VITAMINB12:2,FOLATE:2,FERRITIN:2,TIBC:2,IRON:2,RETICCTPCT:2 in the last 72 hours No results found for this basename: HGBA1C    The CrCl is unknown because both a height and weight (above a minimum accepted value) are required for  this calculation.  ABG    Component Value Date/Time   TCO2 28 10/19/2012 1035     No results found for this basename: DDIMER      Cultures:    Component Value Date/Time   SDES URINE, RANDOM 04/10/2012 1633   SPECREQUEST NONE 04/10/2012 1633   CULT VIRIDANS STREPTOCOCCUS 04/10/2012 1633   REPTSTATUS 04/11/2012 FINAL 04/10/2012 1633       Radiological Exams on Admission: Dg Chest Port 1 View  12/25/2012  *RADIOLOGY REPORT*  Clinical Data: Shortness of breath.  Hypotension.  Low oxygen saturation.  PORTABLE CHEST - 1 VIEW  Comparison: 04/10/2012  Findings: Shallow inspiration. The heart size and pulmonary vascularity are normal. The lungs appear clear and expanded without focal air space disease or consolidation. No blunting of the costophrenic angles.  No pneumothorax.  Mediastinal contours appear intact.  Calcified and tortuous aorta.  Severe degenerative changes in both shoulders with resorption of the distal clavicles. Postoperative change in the left shoulder.  IMPRESSION: No evidence of active pulmonary disease.   Original Report Authenticated By: Burman Nieves, M.D.     Chart has been reviewed  Assessment/Plan  79 yo F w recent hx of antibiotic use and diarrhea for the past 5 days here with severe dehydration and ARF  Present on Admission:  . ARF (acute renal failure)  - - likely secondary to dehydration, check FeNA and if not improved with IVF would obtain renal US and  renal consult.   . Dehydration - IVF, and monitor in stepdown given hypotension . Diarrhea - C. Diff and stool cultures ordered, start empirically on flagyl given severity of the illness, ordered CT of the abdomen to evaluate severe pain . Hypotension - ordred blood cultures and lactate improving with rehydration . Rash of groin - nystatin cream for now if no improvement further work up could be done . Hx of scabies  - she is sp treatment, current rash appears atypical for scabies    Prophylaxis: SCD   Protonix  CODE STATUS: FULL CODE per family  Other plan as per orders.  I have spent a total of 65 min on this admission, spoke with CCM  Regarding the patient.   Marsheila Alejo 01/03/2013, 12:47 AM

## 2013-01-03 NOTE — ED Notes (Signed)
Stool amount not sufficient nor suitable for (contaminated with brief/diaper material) for collection.

## 2013-01-03 NOTE — Progress Notes (Signed)
Patient seen and examined. Discussed with daughter at bedside. She was admitted earlier today for progressive encephalopathy over the past 3 weeks. Also has diarrhea and ARF with a Cr of 5. She is not known to have CKD. Has been on 2 courses of abx recently for a tooth infection and for a UTI. Agree with flagyl while we await results of c diff PCR. Continue IVF and recheck labs in am. If mental status fails to improve with correction of infection and dehydration, will need to consider other causes like CVA. Will continue to follow closely. Will keep in SDU today given marginal BPs.  Peggye Pitt, MD [facility] Pager: [phone]

## 2013-01-03 NOTE — Evaluation (Signed)
Clinical/Bedside Swallow Evaluation Patient Details  Name: JAYLN BRANSCOM MRN: [id number] Date of Birth: 09-13-1924  Today's Date: 01/03/2013 Time: 4098-1191 SLP Time Calculation (min): 39 min  Past Medical History:  Past Medical History  Diagnosis Date  . COPD (chronic obstructive pulmonary disease)   . Frequent UTI   . Hypertension   . Arthritis   . Osteoarthritis   . Hypercholesteremia   . Asthma   . Thyroid disease   . Environmental allergies   . Osteopenia   . Macular degeneration   . Hearing loss   . Constipation   . Back pain   . Dementia   . PONV (postoperative nausea and vomiting)   . Hypothyroidism   . Blood transfusion     " no reaction to transfusion "  . Seizures    Past Surgical History:  Past Surgical History  Procedure Date  . Kyphosis surgery   . Spinal fusion   . Rotator cuff repair     unsuccessful x 2  . Bladder surgery     with mesh & vaginal wall   HPI:   week hx of feeling poorly at first she was diagnosed with UTI and was treated with IM antibiotics. For 1 week she developed diarrhea with frequent episodes. She has not been eating anything for the past 5 days. Her mental status has declined and she has been becoming progressively more confused. At this point patient is minimally responsive moaning to physical stimulation. In ER she was found to be dehydrated with Cr of 5.3 and WBC of 21.7. Hospitalist service to admit for IV rehydration and diarrhea work up.    Patient's daughter present during evaluation and states patient reporting globus sensation during meals. Patient referred for BSE to assess risk for aspiration and recommend safest, possible  po diet.  Assessment / Plan / Recommendation Clinical Impression  Patient with baseline esophageal dysphagia and now presenting with oropharyngeal dysphagia marked mainly by weakness.  Delay with initiation with +s/s of aspiration after swallow of thin and nectar thick liquids due to delay in  initiation with reduced hyoid laryngeal elevation.   Diagnostic PO trials of solids deferred secondary to fluctuating LOA.  Recommend to proceed with dysphagia 1 diet consistency (puree) and honey thick liquids with full supervision with all meals as aspiration risk remains high even with modified diet.  Recommend strict aspiration precautions and administer PO's only when fully alert.  ST to follow for diet tolerance and possible advancement.      Aspiration Risk    Severe   Diet Recommendation Dysphagia 1 (Puree);Honey-thick liquid   Liquid Administration via: Spoon;Cup Medication Administration: Crushed with puree Supervision: Full supervision/cueing for compensatory strategies Postural Changes and/or Swallow Maneuvers: Seated upright 90 degrees;Upright 30-60 min after meal    Other  Recommendations Oral Care Recommendations: Oral care before and after PO Other Recommendations: Order thickener from pharmacy;Prohibited food (jello, ice cream, thin soups);Clarify dietary restrictions;Remove water pitcher;Have oral suction available   Follow Up Recommendations  Skilled Nursing facility    Frequency and Duration min 2x/week  2 weeks   Pertinent Vitals/Pain Increase in RR from 18 to 23 s/p thin liquid trials.     SLP Swallow Goals Patient will consume recommended diet without observed clinical signs of aspiration with: Maximum assistance Patient will utilize recommended strategies during swallow to increase swallowing safety with: Maximum assistance   Swallow Study Prior Functional Status   Resident of SNF diet changed to Mechanical soft per patient's daughter.  General Date of Onset: 12/03/2012 HPI:  week hx of feeling poorly at first she was diagnosed with UTI and was treated with IM antibiotics. For 1 week she developed diarrhea with frequent episodes. She has not been eating anything for the past 5 days. Her mental status has declined and she has been becoming progressively more  confused. At this point patient is minimally responsive moaning to physical stimulation. In ER she was found to be dehydrated with Cr of 5.3 and WBC of 21.7. Hospitalist service to admit for IV rehydration and diarrhea work up.   Type of Study: Bedside swallow evaluation Diet Prior to this Study: NPO;Other (Comment) (with exception of meds crushed in puree) Respiratory Status: Supplemental O2 delivered via (comment) History of Recent Intubation: No Behavior/Cognition: Confused;Lethargic;Distractible;Requires cueing;Hard of hearing;Decreased sustained attention Oral Cavity - Dentition: Dentures, top;Dentures, bottom Self-Feeding Abilities: Needs assist Patient Positioning: Upright in bed Baseline Vocal Quality: Clear Volitional Cough: Weak Volitional Swallow: Able to elicit    Oral/Motor/Sensory Function Overall Oral Motor/Sensory Function: Impaired Labial ROM: Reduced left Labial Symmetry: Abnormal symmetry left Labial Strength: Reduced Labial Sensation: Reduced Lingual ROM: Reduced left Lingual Symmetry: Abnormal symmetry left Lingual Strength: Reduced Lingual Sensation: Reduced Facial ROM: Reduced left Facial Strength: Within Functional Limits Facial Sensation: Within Functional Limits Velum: Within Functional Limits Mandible: Within Functional Limits   Ice Chips Ice chips: Impaired Presentation: Spoon Oral Phase Functional Implications: Prolonged oral transit Pharyngeal Phase Impairments: Suspected delayed Swallow;Decreased hyoid-laryngeal movement;Cough - Delayed   Thin Liquid Thin Liquid: Impaired Presentation: Cup;Spoon Pharyngeal  Phase Impairments: Suspected delayed Swallow;Decreased hyoid-laryngeal movement;Multiple swallows;Wet Vocal Quality;Cough - Immediate    Nectar Thick Nectar Thick Liquid: Impaired Presentation: Spoon Pharyngeal Phase Impairments: Suspected delayed Swallow;Throat Clearing - Delayed;Decreased hyoid-laryngeal movement;Cough - Delayed;Change in Vital  Signs   Honey Thick Honey Thick Liquid: Impaired Pharyngeal Phase Impairments: Suspected delayed Swallow;Decreased hyoid-laryngeal movement   Puree Puree: Impaired Presentation: Spoon Oral Phase Functional Implications: Prolonged oral transit;Oral residue Pharyngeal Phase Impairments: Suspected delayed Swallow;Decreased hyoid-laryngeal movement   Solid   GO    Solid: Not tested      Moreen Fowler MS, CCC-SLP [phone] [facility] 01/03/2013,3:54 PM

## 2013-01-03 NOTE — Progress Notes (Signed)
Report called to 3300.   Family is at bedside and will follow the patient to the new unit.  BP 102/65, HR 102, RR 21, Temp 98.4, Sats 100% on 2l.

## 2013-01-03 DEATH — deceased

## 2013-01-04 ENCOUNTER — Inpatient Hospital Stay (HOSPITAL_COMMUNITY): Payer: Medicare Other

## 2013-01-04 DIAGNOSIS — G9340 Encephalopathy, unspecified: Secondary | ICD-10-CM

## 2013-01-04 DIAGNOSIS — A414 Sepsis due to anaerobes: Secondary | ICD-10-CM | POA: Diagnosis not present

## 2013-01-04 DIAGNOSIS — R6520 Severe sepsis without septic shock: Secondary | ICD-10-CM

## 2013-01-04 DIAGNOSIS — D72829 Elevated white blood cell count, unspecified: Secondary | ICD-10-CM | POA: Diagnosis present

## 2013-01-04 DIAGNOSIS — A419 Sepsis, unspecified organism: Secondary | ICD-10-CM

## 2013-01-04 DIAGNOSIS — I959 Hypotension, unspecified: Secondary | ICD-10-CM

## 2013-01-04 DIAGNOSIS — A0472 Enterocolitis due to Clostridium difficile, not specified as recurrent: Secondary | ICD-10-CM

## 2013-01-04 DIAGNOSIS — R6521 Severe sepsis with septic shock: Secondary | ICD-10-CM | POA: Diagnosis present

## 2013-01-04 DIAGNOSIS — R4182 Altered mental status, unspecified: Secondary | ICD-10-CM | POA: Diagnosis not present

## 2013-01-04 LAB — CBC
MCHC: 34 g/dL (ref 30.0–36.0)
MCV: 89.4 fL (ref 78.0–100.0)
Platelets: 181 10*3/uL (ref 150–400)
RDW: 16.7 % — ABNORMAL HIGH (ref 11.5–15.5)
WBC: 21.4 10*3/uL — ABNORMAL HIGH (ref 4.0–10.5)

## 2013-01-04 LAB — COMPREHENSIVE METABOLIC PANEL
AST: 38 U/L — ABNORMAL HIGH (ref 0–37)
Albumin: 1.3 g/dL — ABNORMAL LOW (ref 3.5–5.2)
Chloride: 123 mEq/L — ABNORMAL HIGH (ref 96–112)
Creatinine, Ser: 4.65 mg/dL — ABNORMAL HIGH (ref 0.50–1.10)
Total Bilirubin: 0.1 mg/dL — ABNORMAL LOW (ref 0.3–1.2)
Total Protein: 4.5 g/dL — ABNORMAL LOW (ref 6.0–8.3)

## 2013-01-04 LAB — CARBOXYHEMOGLOBIN
Carboxyhemoglobin: 0.2 % — ABNORMAL LOW (ref 0.5–1.5)
Methemoglobin: 1.1 % (ref 0.0–1.5)
O2 Saturation: 65.1 %
Total hemoglobin: 13.3 g/dL (ref 12.0–16.0)

## 2013-01-04 LAB — TYPE AND SCREEN: Antibody Screen: NEGATIVE

## 2013-01-04 LAB — MAGNESIUM: Magnesium: 2.4 mg/dL (ref 1.5–2.5)

## 2013-01-04 MED ORDER — SODIUM CHLORIDE 0.9 % IV BOLUS (SEPSIS): 500.0000 mL | Freq: Once | INTRAVENOUS | Status: DC

## 2013-01-04 MED ORDER — NOREPINEPHRINE BITARTRATE 1 MG/ML IJ SOLN
2.0000 ug/min | INTRAVENOUS | Status: DC | PRN
  Administered 2013-01-04: 2 ug/min via INTRAVENOUS
  Filled 2013-01-04: qty 4

## 2013-01-04 MED ORDER — LORAZEPAM 2 MG/ML IJ SOLN
INTRAMUSCULAR | Status: AC
  Filled 2013-01-04: qty 1

## 2013-01-04 MED ORDER — METRONIDAZOLE IN NACL 5-0.79 MG/ML-% IV SOLN
500.0000 mg | Freq: Once | INTRAVENOUS | Status: DC
  Filled 2013-01-04: qty 100

## 2013-01-04 MED ORDER — METRONIDAZOLE IN NACL 5-0.79 MG/ML-% IV SOLN
500.0000 mg | Freq: Three times a day (TID) | INTRAVENOUS | Status: DC
  Administered 2013-01-04 – 2013-01-17 (×39): 500 mg via INTRAVENOUS
  Filled 2013-01-04 (×44): qty 100

## 2013-01-04 MED ORDER — SODIUM CHLORIDE 0.9 % IV BOLUS (SEPSIS)
1000.0000 mL | Freq: Once | INTRAVENOUS | Status: AC
  Administered 2013-01-04: 1000 mL via INTRAVENOUS

## 2013-01-04 MED ORDER — DOBUTAMINE IN D5W 4-5 MG/ML-% IV SOLN
2.5000 ug/kg/min | INTRAVENOUS | Status: DC | PRN
  Administered 2013-01-04: 2.5 ug/kg/min via INTRAVENOUS
  Filled 2013-01-04: qty 250

## 2013-01-04 MED ORDER — SODIUM CHLORIDE 0.9 % IV BOLUS (SEPSIS)
500.0000 mL | INTRAVENOUS | Status: DC | PRN
  Administered 2013-01-04: 19:00:00 via INTRAVENOUS
  Administered 2013-01-04: 500 mL via INTRAVENOUS

## 2013-01-04 MED ORDER — ENOXAPARIN SODIUM 30 MG/0.3ML ~~LOC~~ SOLN
30.0000 mg | SUBCUTANEOUS | Status: DC
  Administered 2013-01-05: 30 mg via SUBCUTANEOUS
  Filled 2013-01-04 (×2): qty 0.3

## 2013-01-04 MED ORDER — VANCOMYCIN HCL 500 MG IV SOLR
500.0000 mg | Freq: Four times a day (QID) | Status: DC
  Administered 2013-01-04 – 2013-01-05 (×4): 500 mg via RECTAL
  Filled 2013-01-04 (×11): qty 500

## 2013-01-04 MED ORDER — SODIUM CHLORIDE 0.9 % IV SOLN: INTRAVENOUS | Status: DC

## 2013-01-04 MED ORDER — SODIUM CHLORIDE 0.9 % IV BOLUS (SEPSIS): 25.0000 mL/kg | Freq: Once | INTRAVENOUS | Status: DC

## 2013-01-04 MED ORDER — RESOURCE THICKENUP CLEAR PO POWD
ORAL | Status: DC | PRN
  Filled 2013-01-04: qty 125

## 2013-01-04 MED ORDER — VANCOMYCIN 50 MG/ML ORAL SOLUTION
500.0000 mg | Freq: Four times a day (QID) | ORAL | Status: DC
  Filled 2013-01-04 (×5): qty 10

## 2013-01-04 MED ORDER — SODIUM CHLORIDE 0.9 % IV BOLUS (SEPSIS)
500.0000 mL | Freq: Once | INTRAVENOUS | Status: AC
  Administered 2013-01-04: 500 mL via INTRAVENOUS

## 2013-01-04 MED ORDER — DEXTROSE 5 % IV SOLN
INTRAVENOUS | Status: DC
  Administered 2013-01-04 – 2013-01-05 (×2): via INTRAVENOUS

## 2013-01-04 NOTE — Progress Notes (Signed)
Pt transferred to 2314, per MD order. Report called to receiving nurse and all questions answered. Family aware of transfer.

## 2013-01-04 NOTE — Plan of Care (Signed)
Problem: Problem: Skin/Wound Progression Goal: HEALING PRESSURE ULCER Outcome: Not Progressing Pt has moisture excoriation from incontinence on bottom--skin care interventions in place

## 2013-01-04 NOTE — Progress Notes (Signed)
ANTIBIOTIC CONSULT NOTE - INITIAL  Pharmacy Consult for Flagyl Indication: Sepsis 2/2 to C-diff  Allergies  Allergen Reactions  . Codeine Other (See Comments)    Unknown.  [name] Hyomax (Hyoscyamine Sulfate) Other (See Comments)    unknown  . Other     Negative reactions to narcotics/opiates  . Oxycodone Hcl Er Other (See Comments)    unknown  . Sulfa Antibiotics Other (See Comments)    unknown    Patient Measurements: Height: 5\' 2"  (157.5 cm) (per daughter) Weight: 115 lb 1.3 oz (52.2 kg) IBW/kg (Calculated) : 50.1   Vital Signs: Temp: 98.2 F (36.8 C) (02/02 0700) Temp src: Oral (02/02 0700) BP: 109/52 mmHg (02/02 1200) Pulse Rate: 111  (02/02 1200) Intake/Output from previous day: 02/01 0701 - 02/02 0700 In: 1000 [I.V.:1000] Out: 150 [Urine:150] Intake/Output from this shift: Total I/O In: 400 [I.V.:400] Out: 30 [Urine:30]  Labs:  [facility] 01/03/13 0900 12/30/2012 2212  WBC 22.5* 21.7*  HGB 13.1 14.2  PLT 327 374  LABCREA -- --  CREATININE 5.44* 5.31*   Estimated Creatinine Clearance: 5.9 ml/min (by C-G formula based on Cr of 5.44). No results found for this basename: VANCOTROUGH:2,VANCOPEAK:2,VANCORANDOM:2,GENTTROUGH:2,GENTPEAK:2,GENTRANDOM:2,TOBRATROUGH:2,TOBRAPEAK:2,TOBRARND:2,AMIKACINPEAK:2,AMIKACINTROU:2,AMIKACIN:2, in the last 72 hours   Microbiology: Recent Results (from the past 720 hour(s))  MRSA PCR SCREENING     Status: Normal   Collection Time   01/03/13  3:36 AM      Component Value Range Status Comment   MRSA by PCR NEGATIVE  NEGATIVE Final   CLOSTRIDIUM DIFFICILE BY PCR     Status: Abnormal   Collection Time   01/03/13 10:37 AM      Component Value Range Status Comment   C difficile by pcr POSITIVE (*) NEGATIVE Final     Medical History: Past Medical History  Diagnosis Date  . COPD (chronic obstructive pulmonary disease)   . Frequent UTI   . Hypertension   . Arthritis   . Osteoarthritis   . Hypercholesteremia   . Asthma   . Thyroid  disease   . Environmental allergies   . Osteopenia   . Macular degeneration   . Hearing loss   . Constipation   . Back pain   . Dementia   . PONV (postoperative nausea and vomiting)   . Hypothyroidism   . Blood transfusion     " no reaction to transfusion "  . Seizures     Assessment: 79 yo F w recent hx of antibiotic use and diarrhea for the past 5 days, C-diff PCR positive,  Wbc elevated = 22.5, now in septic shock, to start PR vancomycin and IV flagyl for severe C-diff.   Pt. Is in ARF, with crcl ~ 52ml/min  Plan:  Continue Flagyl 500mg  IV Q 8 hrs Continue Vancomycin Enema 500mg  PR 4 time daily  We will continue follow up clinical course  Change lovenox to 30mg  sq q 24hrs  Bayard Hugger, PharmD, BCPS  Clinical Pharmacist  Pager: [phone]  01/04/2013,12:13 PM

## 2013-01-04 NOTE — Procedures (Signed)
Central Venous Catheter Insertion Procedure Note Shirley Bond [id number] 05/19/1924  Procedure: Insertion of Central Venous Catheter Indications: Assessment of intravascular volume, Drug and/or fluid administration and Frequent blood sampling  Procedure Details Consent: Risks of procedure as well as the alternatives and risks of each were explained to the (patient/caregiver).  Consent for procedure obtained. Time Out: Verified patient identification, verified procedure, site/side was marked, verified correct patient position, special equipment/implants available, medications/allergies/relevent history reviewed, required imaging and test results available.  Performed  Maximum sterile technique was used including antiseptics, cap, gloves, gown, hand hygiene, mask and sheet. Skin prep: Chlorhexidine; local anesthetic administered A antimicrobial bonded/coated triple lumen catheter was placed in the left internal jugular vein using the Seldinger technique. Ultrasound guidance used.yes Catheter placed to 20 cm. Blood aspirated via all 3 ports and then flushed x 3. Line sutured x 2 and dressing applied.  Evaluation Blood flow good Complications: No apparent complications Patient did tolerate procedure well. Chest X-ray ordered to verify placement.  CXR: pending.  Brett Canales Minor ACNP Adolph Pollack PCCM Pager [phone] till 3 pm If no answer page [phone] 01/04/2013, 12:50 PM   Attending  I was present for and supervised the procedure  Yolonda Kida PCCM Pager: [phone] Cell: [phone] If no response, call [phone]

## 2013-01-04 NOTE — Plan of Care (Signed)
Problem: Phase I Progression Outcomes Goal: OOB as tolerated unless otherwise ordered Outcome: Not Met (add Reason) Pt is weak and mental status is such that we can not get her out of bed at this point. Will progress activity with chair position in the bed.  Goal: Voiding-avoid urinary catheter unless indicated Outcome: Not Met (add Reason) Pt has Foley for urinary output monitoring in the presence of acute renal failure with this admission Goal: Hemodynamically stable Outcome: Progressing BP and CVP more stable with sepsis protocol followed today

## 2013-01-04 NOTE — Progress Notes (Signed)
[facility] PCCM  I discussed the situation with Ms. Merlos's daughter and son today.  I advised them that mechanical ventilation and CPR would not have clear benefit given the severity of her illness and her baseline dementia and age.  They agreed to make her code status limited code: pressors OK, but no CPR, no shocks, no mechanical ventilation.  I told them that hemodialysis was not an option.  Yolonda Kida PCCM Pager: [phone] Cell: [phone] If no response, call [phone]

## 2013-01-04 NOTE — Progress Notes (Signed)
SLP Cancellation Note  Patient Details Name: Shirley Bond MRN: [id number] DOB: 13-May-1924   Cancelled treatment: ST to follow for diet tolerance of dysphagia 1 and honey thick liquids.  Attempt x1 but patient not seen due to change in medical status preventing full participation.  Patient NPO status at this time due to patient presenting with decreased LOA increasing risk for aspiration.  ST to follow in acute care setting for POC.   Moreen Fowler MS, CCC-SLP [phone] [facility] 01/04/2013, 10:55 AM

## 2013-01-04 NOTE — Consult Note (Signed)
PULMONARY  / CRITICAL CARE MEDICINE  Name: Shirley Bond MRN: [id number] DOB: 1924-02-28    ADMISSION DATE:  12/31/2012 CONSULTATION DATE:  01/04/13  REFERRING MD :  Adriana Reams  CHIEF COMPLAINT:  Septic shock  BRIEF PATIENT DESCRIPTION: This is an 79 y/o female who lives in a SNF was admitted on 2/1 with septic shock from C.diff.  PCCM consulted on 2/2.  SIGNIFICANT EVENTS / STUDIES:  2/1 admission 2/2 transfer to ICU  LINES / TUBES: 2/2 CVL >>  CULTURES: 2/2 blood >> 2/1 urine >> 2/1 c.diff >> positive  ANTIBIOTICS: 2/1 vanc >> 2/1 flagyl >>  HISTORY OF PRESENT ILLNESS:  79 y/o female SNF resident with dementia and osteoarthritis brought to the [facility] on 2/1 with AMS and hypotension.  She was found by the [facility] service to have c.diff colitis and was placed on flagyl and po vanc. In the month prior to admission she was treated with multiple antibiotics for UTI's and tooth disease infections.    PAST MEDICAL HISTORY :  Past Medical History  Diagnosis Date  . COPD (chronic obstructive pulmonary disease)   . Frequent UTI   . Hypertension   . Arthritis   . Osteoarthritis   . Hypercholesteremia   . Asthma   . Thyroid disease   . Environmental allergies   . Osteopenia   . Macular degeneration   . Hearing loss   . Constipation   . Back pain   . Dementia   . PONV (postoperative nausea and vomiting)   . Hypothyroidism   . Blood transfusion     " no reaction to transfusion "  . Seizures    Past Surgical History  Procedure Date  . Kyphosis surgery   . Spinal fusion   . Rotator cuff repair     unsuccessful x 2  . Bladder surgery     with mesh & vaginal wall   Prior to Admission medications   Medication Sig Start Date End Date Taking? Authorizing Provider  acetaminophen (TYLENOL) 500 MG tablet Take 500 mg by mouth every 8 (eight) hours.   Yes Historical Provider, MD  amLODipine (NORVASC) 10 MG tablet Take 1 tablet (10 mg total) by mouth daily. 04/15/12 04/15/13  Yes Simbiso Ranga, MD  beta carotene w/minerals (OCUVITE) tablet Take 1 tablet by mouth daily.    Yes Historical Provider, MD  budesonide-formoterol (SYMBICORT) 160-4.5 MCG/ACT inhaler Inhale 2 puffs into the lungs 2 (two) times daily.   Yes Historical Provider, MD  Calcium Carbonate (CALTRATE 600 PO) Take 600 mg by mouth 3 (three) times daily.    Yes Historical Provider, MD  chlorhexidine (PERIDEX) 0.12 % solution Use as directed 15 mLs in the mouth or throat at bedtime.   Yes Historical Provider, MD  conjugated estrogens (PREMARIN) vaginal cream Place 0.5 g vaginally once a week. On Saturdays   Yes Historical Provider, MD  diclofenac sodium (VOLTAREN) 1 % GEL Apply 1 application topically 4 (four) times daily. Massage into painful joints   Yes Historical Provider, MD  divalproex (DEPAKOTE SPRINKLE) 125 MG capsule Take 250 mg by mouth at bedtime.   Yes Historical Provider, MD  docusate sodium (COLACE) 100 MG capsule Take 100 mg by mouth 2 (two) times daily.   Yes Historical Provider, MD  DULoxetine (CYMBALTA) 60 MG capsule Take 60 mg by mouth daily.   Yes Historical Provider, MD  folic acid (FOLVITE) 1 MG tablet Take 1 tablet (1 mg total) by mouth daily. 04/15/12  Yes  Simbiso Ranga, MD  ipratropium (ATROVENT) 0.03 % nasal spray Place 1 spray into the nose 2 (two) times daily.   Yes Historical Provider, MD  ketotifen (ZADITOR) 0.025 % ophthalmic solution Place 1 drop into both eyes 2 (two) times daily.   Yes Historical Provider, MD  levothyroxine (SYNTHROID, LEVOTHROID) 50 MCG tablet Take 50 mcg by mouth daily.   Yes Historical Provider, MD  lidocaine (LIDODERM) 5 % Place 1 patch onto the skin daily. Remove & Discard patch within 12 hours or as directed by MD   Yes Historical Provider, MD  lubiprostone (AMITIZA) 24 MCG capsule Take 24 mcg by mouth 2 (two) times daily with a meal.   Yes Historical Provider, MD  Melatonin 3 MG TABS Take 6 mg by mouth at bedtime.   Yes Historical Provider, MD   metoprolol succinate (TOPROL-XL) 50 MG 24 hr tablet Take 50 mg by mouth daily. Take with or immediately following a meal.   Yes Historical Provider, MD  Nutritional Supplements (BOOST PLUS PO) Take 1 Bottle by mouth 2 (two) times daily.   Yes Historical Provider, MD  OLANZapine (ZYPREXA) 2.5 MG tablet Take 2.5 mg by mouth 2 (two) times daily.   Yes Historical Provider, MD  OMEGA 3 1000 MG CAPS Take 2,000 mg by mouth 2 (two) times daily.   Yes Historical Provider, MD  omeprazole (PRILOSEC) 20 MG capsule Take 20 mg by mouth daily with lunch.    Yes Historical Provider, MD  Polyethyl Glycol-Propyl Glycol (SYSTANE OP) Place 1-2 drops into both eyes 4 (four) times daily.   Yes Historical Provider, MD  polyethylene glycol powder (MIRALAX) powder Take 17 g by mouth 2 (two) times daily.    Yes Historical Provider, MD  Probiotic Product (PROBIOTIC DAILY PO) Take 1 capsule by mouth 2 (two) times daily.   Yes Historical Provider, MD  ranitidine (ZANTAC) 300 MG tablet Take 300 mg by mouth at bedtime.   Yes Historical Provider, MD  traMADol-acetaminophen (ULTRACET) 37.5-325 MG per tablet Take 1 tablet by mouth 2 (two) times daily. For pain   Yes Historical Provider, MD  vitamin A 81191 UNIT capsule Take 10,000 Units by mouth 2 (two) times daily.    Yes Historical Provider, MD   Allergies  Allergen Reactions  . Codeine Other (See Comments)    Unknown.  [name] Hyomax (Hyoscyamine Sulfate) Other (See Comments)    unknown  . Other     Negative reactions to narcotics/opiates  . Oxycodone Hcl Er Other (See Comments)    unknown  . Sulfa Antibiotics Other (See Comments)    unknown    FAMILY HISTORY:  Family History  Problem Relation Age of Onset  . Cancer Sister    SOCIAL HISTORY:  reports that she has never smoked. She has never used smokeless tobacco. She reports that she does not drink alcohol or use illicit drugs.  REVIEW OF SYSTEMS:  Cannot obtain due to confusion  SUBJECTIVE:   VITAL  SIGNS: Temp:  [97.6 F (36.4 C)-98.9 F (37.2 C)] 98.2 F (36.8 C) (02/02 0700) Pulse Rate:  [93-111] 110  (02/02 0316) Resp:  [18-32] 29  (02/02 0316) BP: (84-115)/(47-60) 93/53 mmHg (02/02 0316) SpO2:  [96 %-100 %] 100 % (02/02 0316) HEMODYNAMICS:   VENTILATOR SETTINGS:   INTAKE / OUTPUT: Intake/Output      02/01 0701 - 02/02 0700 02/02 0701 - 02/03 0700   I.V. (mL/kg) 900 (17.2)    Total Intake(mL/kg) 900 (17.2)    Urine (mL/kg/hr) 150 (  0.1)    Total Output 150    Net +750           PHYSICAL EXAMINATION: Gen: chronically ill appaering, moaning, in pain HEENT: NCAT, PERRL, EOMi, MM profoundly dry PULM: CTA B CV: RRR, no mgr, no JVD AB: BS infrequent, tender Ext: warm, trace edema, no clubbing, no cyanosis Derm: no rash or skin breakdown Neuro: somnolent but arouses to voice, does not follow commands, says it is 1913 and she is in a nursing home  LABS:  Lab 01/03/13 0900 01/03/13 0234 12/15/2012 2212  HGB 13.1 -- 14.2  WBC 22.5* -- 21.7*  PLT 327 -- 374  NA 148* -- 142  K 5.2* -- 5.2*  CL 110 -- 102  CO2 16* -- 17*  GLUCOSE 144* -- 195*  BUN 99* -- 93*  CREATININE 5.44* -- 5.31*  CALCIUM 8.3* -- 8.7  MG -- -- --  PHOS -- -- --  AST 35 -- --  ALT 13 -- --  ALKPHOS 123* -- --  BILITOT 0.2* -- --  PROT 5.9* -- --  ALBUMIN 1.8* -- --  APTT -- -- --  INR -- -- --  LATICACIDVEN 3.3* -- --  TROPONINI -- -- --  PROCALCITON -- -- --  PROBNP -- -- --  O2SATVEN -- -- --  PHART -- 7.436 --  PCO2ART -- 27.9* --  PO2ART -- 152.0* --    Lab 01/03/13 0803  GLUCAP 135*    2/1 CXR: thoracic spine hardware, lungs clear, normal cardiac sillhouette  ASSESSMENT / PLAN:  PULMONARY A: no acute issues P:   -O2 as needed  CARDIOVASCULAR A: Septic shock P:  -place CVL -sepsis protocol -tele -echo -rule out for MI  RENAL A:   AKI, likely not a dialysis candidate Hyperkalemia  Metabolic acidosis P:   -IVF -serial BMET -monitor UOP -hold off on  renal consult 2/2 as no acute indication  GASTROINTESTINAL A:   c.diff P:   -see ID  HEMATOLOGIC A:   No acute issues P:  -monitor for bleeding  INFECTIOUS A:   Septic shock from c.diff colitis P:   -po vanc and flagyl -KUB abdomen  ENDOCRINE A:   Mildly elevated TSH likely sick euthyroid P:   -no indication for treatment  NEUROLOGIC A:   Acute encephalopathy P:   -supportive care  Code status: Lengthy discussion with daughter.  She states that her mother is FULL CODE and makes it clear that she has had this conversation many times in the last year.  TODAY'S SUMMARY:   I have personally obtained a history, examined the patient, evaluated laboratory and imaging results, formulated the assessment and plan and placed orders. CRITICAL CARE: The patient is critically ill with multiple organ systems failure and requires high complexity decision making for assessment and support, frequent evaluation and titration of therapies, application of advanced monitoring technologies and extensive interpretation of multiple databases. Critical Care Time devoted to patient care services described in this note is 45 minutes.   Fonnie Jarvis Pulmonary and Critical Care Medicine [facility] Pager: [phone]  01/04/2013, 8:51 AM

## 2013-01-04 NOTE — Progress Notes (Signed)
[facility]             Progress Note   Subjective: Very lethargic, mumbles nonsensically when talked to and opens eyes briefly.  Objective: Vital signs in last 24 hours: Temp:  [97.6 F (36.4 C)-98.9 F (37.2 C)] 98.2 F (36.8 C) (02/02 0700) Pulse Rate:  [93-111] 110  (02/02 0316) Resp:  [18-32] 29  (02/02 0316) BP: (84-115)/(47-60) 93/53 mmHg (02/02 0316) SpO2:  [96 %-100 %] 100 % (02/02 0316) Weight change:  Last BM Date: 01/03/13  Intake/Output from previous day: 02/01 0701 - 02/02 0700 In: 900 [I.V.:900] Out: 150 [Urine:150]     Physical Exam: General: lethargic. HEENT: No bruits, no goiter. Heart: Tachy, regular rhythm, without murmurs, rubs, gallops. Lungs: Clear to auscultation bilaterally. Abdomen: Soft, nontender, nondistended, positive bowel sounds. Extremities: No clubbing cyanosis or edema with positive pedal pulses. Neuro: Grossly intact, nonfocal.    Lab Results: Basic Metabolic Panel:  Basename 01/03/13 0900 12/14/2012 2212  NA 148* 142  K 5.2* 5.2*  CL 110 102  CO2 16* 17*  GLUCOSE 144* 195*  BUN 99* 93*  CREATININE 5.44* 5.31*  CALCIUM 8.3* 8.7  MG -- --  PHOS -- --   Liver Function Tests:  Basename 01/03/13 0900  AST 35  ALT 13  ALKPHOS 123*  BILITOT 0.2*  PROT 5.9*  ALBUMIN 1.8*   CBC:  Basename 01/03/13 0900 12/31/2012 2212  WBC 22.5* 21.7*  NEUTROABS -- 19.5*  HGB 13.1 14.2  HCT 38.0 41.8  MCV 89.6 89.9  PLT 327 374   CBG:  Basename 01/03/13 0803  GLUCAP 135*   Thyroid Function Tests:  Basename 01/03/13 0900  TSH 5.628*  T4TOTAL --  FREET4 --  T3FREE --  THYROIDAB --    Recent Results (from the past 240 hour(s))  MRSA PCR SCREENING     Status: Normal   Collection Time   01/03/13  3:36 AM      Component Value Range Status Comment   MRSA by PCR NEGATIVE  NEGATIVE Final   CLOSTRIDIUM DIFFICILE BY PCR     Status: Abnormal   Collection Time   01/03/13 10:37 AM      Component Value Range Status  Comment   C difficile by pcr POSITIVE (*) NEGATIVE Final     Studies/Results: Ct Head Wo Contrast  01/03/2013  *RADIOLOGY REPORT*  Clinical Data: Altered mental status.  CT HEAD WITHOUT CONTRAST  Technique:  Contiguous axial images were obtained from the base of the skull through the vertex without contrast.  Comparison: 10/19/2012  Findings: Study is technically limited by motion artifact.  Diffuse cerebral atrophy.  Mild ventricular dilatation consistent with central atrophy.  Low attenuation changes in the deep white matter consistent with small vessel ischemia.  No mass effect or midline shift.  No abnormal extra-axial fluid collections.  Gray-white matter junctions are distinct.  Basal cisterns are not effaced.  No evidence of acute intracranial hemorrhage.  No depressed skull fractures.  Visualized mastoid air cells and paranasal sinuses are not opacified.  Vascular calcifications.  Stable appearance since previous study.  IMPRESSION: No acute intracranial abnormalities.  Chronic atrophy and small vessel ischemic changes.   Original Report Authenticated By: Burman Nieves, M.D.    US Renal  01/03/2013  *RADIOLOGY REPORT*  Clinical Data: To renal failure  RENAL/URINARY TRACT ULTRASOUND COMPLETE  Comparison:  None.  Findings:  Right Kidney:  Small right kidney, measuring 8.6 cm.  Echogenic renal parenchyma with cortical thinning.  1.5  x 1.5 x 1.2 cm mildly complex right upper pole cyst, likely benign (Bosniak II).  Left Kidney:  Measures 10.9 cm.  No mass or hydronephrosis.  Bladder:  Decompressed by indwelling Foley catheter.  IMPRESSION: No hydronephrosis.  Small right kidney with cortical thinning.  1.5 cm mildly complex right upper pole renal cyst, likely benign (Bosniak II)   Original Report Authenticated By: Charline Bills, M.D.    Dg Chest Port 1 View  28-Jan-2013  *RADIOLOGY REPORT*  Clinical Data: Shortness of breath.  Hypotension.  Low oxygen saturation.  PORTABLE CHEST - 1 VIEW   Comparison: 04/10/2012  Findings: Shallow inspiration. The heart size and pulmonary vascularity are normal. The lungs appear clear and expanded without focal air space disease or consolidation. No blunting of the costophrenic angles.  No pneumothorax.  Mediastinal contours appear intact.  Calcified and tortuous aorta.  Severe degenerative changes in both shoulders with resorption of the distal clavicles. Postoperative change in the left shoulder.  IMPRESSION: No evidence of active pulmonary disease.   Original Report Authenticated By: Burman Nieves, M.D.     Medications: Scheduled Meds:   . acidophilus  1 capsule Oral BID  . budesonide-formoterol  2 puff Inhalation BID  . divalproex  250 mg Oral QHS  . DULoxetine  60 mg Oral Daily  . enoxaparin (LOVENOX) injection  40 mg Subcutaneous Q24H  . folic acid  1 mg Oral Daily  . Gerhardt's butt cream   Topical TID  . ipratropium  2 spray Each Nare BID  . levothyroxine  50 mcg Oral Daily  . vancomycin  500 mg Oral Q6H   And  . metronidazole  500 mg Intravenous Q8H  . OLANZapine  2.5 mg Oral BID  . olopatadine  1 drop Both Eyes BID  . sodium chloride  1,000 mL Intravenous Once  . sodium chloride  3 mL Intravenous Q12H   Continuous Infusions:   . sodium chloride 100 mL/hr at 01/04/13 0600   PRN Meds:.acetaminophen, acetaminophen, ondansetron (ZOFRAN) IV, ondansetron  Assessment/Plan:  Principal Problem:  *C. difficile colitis Active Problems:  ARF (acute renal failure)  Dehydration  Diarrhea  Hypotension  Rash of groin  Hx of scabies  Encephalopathy acute  Severe sepsis with septic shock  Leukocytosis   Severe Sepsis 2/2 C Diff Colitis -Cont IV flagyl and add PO vanc per c diff protocol. -Increase maintenance fluids to 125 cc/hr and give a 1000 cc bolus now. -Is only 850 CC + since admission. -Hypotensive overnight with SBPs 80-90s. -WBC 22.5. -Lactic acid 3.3. -Have asked CCM to look at her as well.  ARF -Presumed 2/2  c diff colitis and intravascular volume depletion. -Not much changed since yesterday. -Continue IVF.  Will ask CCM to look at her as well.    Time spent coordinating care: 45 minutes   LOS: 2 days   [facility] Pager: [phone] 01/04/2013, 8:36 AM

## 2013-01-04 NOTE — Progress Notes (Signed)
Laboratory tests reviewed; now off pressors, CVP 6.0; worsening acidosis most likely due to hyperchloremia due to aggressive fluid resuscitation +/- diarrhea; with a component of anion gap which is improving, lactic acid improving;   Change fluids to D5; for now; repeat labs in am.

## 2013-01-04 NOTE — Progress Notes (Signed)
Continue sepsis protocol;   Continue fluid resuscitation; CVP 3

## 2013-01-05 ENCOUNTER — Inpatient Hospital Stay (HOSPITAL_COMMUNITY): Payer: Medicare Other

## 2013-01-05 DIAGNOSIS — A414 Sepsis due to anaerobes: Secondary | ICD-10-CM | POA: Diagnosis not present

## 2013-01-05 DIAGNOSIS — R4182 Altered mental status, unspecified: Secondary | ICD-10-CM | POA: Diagnosis not present

## 2013-01-05 LAB — BASIC METABOLIC PANEL
BUN: 90 mg/dL — ABNORMAL HIGH (ref 6–23)
BUN: 87 mg/dL — ABNORMAL HIGH (ref 6–23)
CO2: 11 mEq/L — ABNORMAL LOW (ref 19–32)
CO2: 15 mEq/L — ABNORMAL LOW (ref 19–32)
Calcium: 7.1 mg/dL — ABNORMAL LOW (ref 8.4–10.5)
Chloride: 121 mEq/L — ABNORMAL HIGH (ref 96–112)
Chloride: 121 mEq/L — ABNORMAL HIGH (ref 96–112)
Creatinine, Ser: 3.78 mg/dL — ABNORMAL HIGH (ref 0.50–1.10)
GFR calc Af Amer: 11 mL/min — ABNORMAL LOW (ref >90)
GFR calc non Af Amer: 9 mL/min — ABNORMAL LOW (ref >90)
Glucose, Bld: 98 mg/dL (ref 70–99)
Glucose, Bld: 125 mg/dL — ABNORMAL HIGH (ref 70–99)
Potassium: 4.1 mEq/L (ref 3.5–5.1)
Sodium: 151 mEq/L — ABNORMAL HIGH (ref 135–145)

## 2013-01-05 LAB — CBC
MCH: 30.1 pg (ref 26.0–34.0)
MCHC: 33.3 g/dL (ref 30.0–36.0)
Platelets: 141 10*3/uL — ABNORMAL LOW (ref 150–400)

## 2013-01-05 MED ORDER — VANCOMYCIN 50 MG/ML ORAL SOLUTION
500.0000 mg | Freq: Four times a day (QID) | ORAL | Status: DC
  Administered 2013-01-05 – 2013-01-07 (×3): 500 mg
  Filled 2013-01-05 (×12): qty 10

## 2013-01-05 MED ORDER — VANCOMYCIN HCL 500 MG IV SOLR
500.0000 mg | Freq: Four times a day (QID) | Status: DC
  Administered 2013-01-05 – 2013-01-06 (×3): 500 mg via RECTAL
  Filled 2013-01-05 (×9): qty 500

## 2013-01-05 MED ORDER — FREE WATER
200.0000 mL | Freq: Four times a day (QID) | Status: DC
  Administered 2013-01-05: 200 mL

## 2013-01-05 MED ORDER — SODIUM BICARBONATE 8.4 % IV SOLN
INTRAVENOUS | Status: DC
  Administered 2013-01-05: 14:00:00 via INTRAVENOUS
  Filled 2013-01-05 (×5): qty 150

## 2013-01-05 NOTE — Progress Notes (Signed)
SLP Cancellation Note  Patient Details Name: DIAN LAPRADE MRN: [id number] DOB: 12/05/1923   Cancelled treatment:       Reason Eval/Treat Not Completed: Patient's level of consciousness. Pt not alert per RN. Please call SLP if status changes and pt appears ready to resume PO intake. Thanks, Harlon Ditty, MA CCC-SLP [phone]  [name] 01/05/2013, 9:14 AM

## 2013-01-05 NOTE — Significant Event (Signed)
Have been unable to insert NG sucessfully, two RN tried. MD Delford Field in E-Link made aware. Per MD continue with present orders. Kaylenn Civil, Charity fundraiser.

## 2013-01-05 NOTE — Procedures (Signed)
Failed rn NGT placed ngt 16 french rt nares Heard sounds No bleeding Will confirm with film  Mcarthur Rossetti. Tyson Alias, MD, FACP Pgr: [phone] [place] [facility]

## 2013-01-05 NOTE — Significant Event (Signed)
1810pm-pt arrived safely to 3314, traveled via bed. VS stable prior and during the transfer. Pt family at bedside. No personal belongings at 2314 that could be taken up to patient's new room. Report given to receiving dayshift RN. Satomi Buda, Charity fundraiser.

## 2013-01-05 NOTE — Progress Notes (Signed)
Pts. NGT attempted to advance after the chest x-ray result per report but noted that the tube was coiled on the pts. Mouth and back of the throat. Pt. Was so restless and uncomfortable per daughter since transferred from 2300. Attempted to reinsert the NGT but unsuccessful by 2 RN and noted some irritation and bleeding from the mouth. NGT was pullled and  and discontinued. According to the daughter pt. Hashx. of esophageal stricture and actually due to have some dilatation on May. Dr. Delford Field made aware of pts. status regarding NGT and po meds. With order to hold for now po meds or via NGT and will address in am. Pt. Was able to relax and calm after tube is out still with some confusion but just need frequent reorientation and reassurance. Will cont. To monitor pt.[name]

## 2013-01-05 NOTE — Progress Notes (Signed)
PULMONARY  / CRITICAL CARE MEDICINE  Name: Shirley Bond MRN: [id number] DOB: [birth year]    ADMISSION DATE:  12/29/2012 CONSULTATION DATE:  01/04/13  REFERRING MD :  Adriana Reams  CHIEF COMPLAINT:  Septic shock  BRIEF PATIENT DESCRIPTION: This is an 79 y/o female who lives in a SNF was admitted on 2/1 with septic shock from C.diff.  PCCM consulted on 2/2 due to shock.  SIGNIFICANT EVENTS / STUDIES:  2/1 admission 2/2 transfer to ICU  LINES / TUBES: 2/2 CVL >>  CULTURES: 2/2 blood >> 2/1 c.diff >> positive  ANTIBIOTICS: 2/1 vanc >> 2/1 flagyl >>   SUBJECTIVE:  Still sleepy, not on prerssors  VITAL SIGNS: Temp:  [97 F (36.1 C)-97.6 F (36.4 C)] 97 F (36.1 C) (02/03 0809) Pulse Rate:  [85-118] 115  (02/03 0900) Resp:  [16-30] 19  (02/03 1000) BP: (71-135)/(38-102) 108/44 mmHg (02/03 1000) SpO2:  [95 %-100 %] 100 % (02/03 1000) HEMODYNAMICS: CVP:  [0 mmHg-8 mmHg] 0 mmHg VENTILATOR SETTINGS:   INTAKE / OUTPUT: Intake/Output      02/02 0701 - 02/03 0700 02/03 0701 - 02/04 0700   I.V. (mL/kg) 2213 (42.4) 285 (5.5)   Other 200    IV Piggyback 7200 100   Total Intake(mL/kg) 9613 (184.2) 385 (7.4)   Urine (mL/kg/hr) 440 (0.4) 30 (0.1)   Stool 1000    Total Output 1440 30   Net +8173 +355          PHYSICAL EXAMINATION: Gen: chronically ill appaering, NAD HEENT:  PERRL, EOMi, MM dry PULM: Clear in all fields, decreased in lower lobes CV: RRR, no mgr, no JVD AB: BS active, tender to palpation Ext: warm, trace edema, no clubbing, no cyanosis Derm: no rash or skin breakdown Neuro: somnolent but opens eyes to voice, does not follow commands, Oriented x 0  LABS:  Lab 01/05/13 0338 01/04/13 1715 01/04/13 1445 01/04/13 0836 01/03/13 0900 01/03/13 0234  HGB 10.5* 10.9* -- -- 13.1 --  WBC 18.8* 21.4* -- -- 22.5* --  PLT 141* 181 -- -- 327 --  NA 150* 151* -- -- 148* --  K 4.0 4.6 -- -- -- --  CL 123* 123* -- -- 110 --  CO2 13* 13* -- -- 16* --  GLUCOSE  98 90 -- -- 144* --  BUN 90* 96* -- -- 99* --  CREATININE 4.17* 4.65* -- -- 5.44* --  CALCIUM 7.1* 7.1* -- -- 8.3* --  MG -- 2.4 -- 2.5 -- --  PHOS -- -- -- 6.7* -- --  AST -- 38* -- -- 35 --  ALT -- 15 -- -- 13 --  ALKPHOS -- 128* -- -- 123* --  BILITOT -- 0.1* -- -- 0.2* --  PROT -- 4.5* -- -- 5.9* --  ALBUMIN -- 1.3* -- -- 1.8* --  APTT -- -- -- -- -- --  INR -- -- -- -- -- --  LATICACIDVEN -- -- 2.8* -- 3.3* --  TROPONINI -- -- <0.30 -- -- --  PROCALCITON -- -- 46.67 -- -- --  PROBNP -- -- -- -- -- --  O2SATVEN -- -- -- -- -- --  PHART -- -- -- -- -- 7.436  PCO2ART -- -- -- -- -- 27.9*  PO2ART -- -- -- -- -- 152.0*    Lab 01/03/13 0803  GLUCAP 135*    2/1 CXR: thoracic spine hardware, lungs clear, normal cardiac sillhouette  ASSESSMENT / PLAN:  PULMONARY A: DNI, non AG acidosis from  stool P:   -O2 as needed -bicarb  CARDIOVASCULAR A: Sepsis - S/p shock - resolved with EGDT P:  -sepsis protocol -Keep  Euvolemic   RENAL A:   AKI -  Baseline Cr 0.9 (10/2012), peaked at 5.4 on 2/1, now trending down. Urine output remains low with improved Cr. Hypernatremia  Hyperchloremia  Metabolic acidosis (NAG: from bicarb loss w/ diarrhea and NS resuscitation efforts).  P:   -IVF to D5 w/ sodium bicarb - free H2O flush -serial BMET -monitor UOP -not a candidate for dialysis   GASTROINTESTINAL A:   c.diff P:   -place NGT for meds and free H20 Flush  -see ID section -if abd pain ok, will consider tubefeeds vs oral intake (depending on MS) 2/4  HEMATOLOGIC A:   Plat trending down P:  -monitor for bleeding -WBC trending down and plat with sepsis, dilution -dc lovenox, if plat less in am , likely trend is sepsis, dilution  INFECTIOUS A:   Septic shock from c.diff colitis P:   -po vanc and flagyl -Continue vanc enema with such significant cdiff  ENDOCRINE A:   Mildly elevated TSH likely sick euthyroid P:   -no indication for treatment -repeat  tsh in 1 week  NEUROLOGIC A:   Acute encephalopathy - likely from current infection, c/b acidosis and hypernatremia P:   -supportive care -free water correction -NPO for now Code status:  Daughter and son agreed to make her code status limited code: pressors OK, but no CPR, no shocks, no mechanical ventilation.  They are also aware that dialysis not an option.     Looking a little better. Daughter updated at bedside. Has several metabolic derangements which are affecting her MS. Need to get her free water corrected. Hope to start diet, either vt or oral on 2/4  01/05/2013, 11:39 AM   Mcarthur Rossetti. Tyson Alias, MD, FACP Pgr: [phone] [place] [facility]

## 2013-01-05 NOTE — Progress Notes (Signed)
eLink Physician-Brief Progress Note Patient Name: Shirley Bond DOB: October 03, 1924 MRN: [id number]  Date of Service  01/05/2013   HPI/Events of Note   NG has come out and coiled in mouth.  RN unable to place. Pt with hx of esophageal strictures  eICU Interventions  Bedside ccm md to reassess in AM. Hold po meds    Intervention Category Major Interventions: Other:  Shan Levans 01/05/2013, 8:56 PM

## 2013-01-06 DIAGNOSIS — I619 Nontraumatic intracerebral hemorrhage, unspecified: Secondary | ICD-10-CM

## 2013-01-06 LAB — BASIC METABOLIC PANEL
BUN: 80 mg/dL — ABNORMAL HIGH (ref 6–23)
BUN: 75 mg/dL — ABNORMAL HIGH (ref 6–23)
CO2: 20 mEq/L (ref 19–32)
CO2: 22 mEq/L (ref 19–32)
Calcium: 8.1 mg/dL — ABNORMAL LOW (ref 8.4–10.5)
Calcium: 7.4 mg/dL — ABNORMAL LOW (ref 8.4–10.5)
Creatinine, Ser: 2.9 mg/dL — ABNORMAL HIGH (ref 0.50–1.10)
GFR calc non Af Amer: 16 mL/min — ABNORMAL LOW (ref >90)
Glucose, Bld: 127 mg/dL — ABNORMAL HIGH (ref 70–99)
Glucose, Bld: 130 mg/dL — ABNORMAL HIGH (ref 70–99)
Glucose, Bld: 84 mg/dL (ref 70–99)
Potassium: 3.2 mEq/L — ABNORMAL LOW (ref 3.5–5.1)
Potassium: 3.3 mEq/L — ABNORMAL LOW (ref 3.5–5.1)
Sodium: 152 mEq/L — ABNORMAL HIGH (ref 135–145)

## 2013-01-06 LAB — CBC
Hemoglobin: 10.6 g/dL — ABNORMAL LOW (ref 12.0–15.0)
MCH: 29.9 pg (ref 26.0–34.0)
MCV: 87.3 fL (ref 78.0–100.0)
RBC: 3.54 MIL/uL — ABNORMAL LOW (ref 3.87–5.11)

## 2013-01-06 MED ORDER — SODIUM CHLORIDE 0.9 % IJ SOLN
INTRAMUSCULAR | Status: AC
  Administered 2013-01-06: 05:00:00
  Filled 2013-01-06: qty 20

## 2013-01-06 MED ORDER — SODIUM CHLORIDE 0.45 % IV SOLN
INTRAVENOUS | Status: DC
  Administered 2013-01-06: 10:00:00 via INTRAVENOUS
  Administered 2013-01-07: 100 mL/h via INTRAVENOUS

## 2013-01-06 MED ORDER — VANCOMYCIN HCL 500 MG IV SOLR
500.0000 mg | Freq: Four times a day (QID) | Status: DC
  Administered 2013-01-06 – 2013-01-07 (×5): 500 mg via RECTAL
  Filled 2013-01-06 (×8): qty 500

## 2013-01-06 MED ORDER — POTASSIUM CHLORIDE 20 MEQ/15ML (10%) PO LIQD: 40.0000 meq | Freq: Once | ORAL | Status: DC

## 2013-01-06 MED ORDER — POTASSIUM CHLORIDE 10 MEQ/50ML IV SOLN
INTRAVENOUS | Status: AC
  Administered 2013-01-06: 10 meq
  Filled 2013-01-06: qty 100

## 2013-01-06 MED ORDER — POTASSIUM CHLORIDE 10 MEQ/50ML IV SOLN
10.0000 meq | INTRAVENOUS | Status: AC
  Administered 2013-01-07 (×4): 10 meq via INTRAVENOUS
  Filled 2013-01-06 (×4): qty 50

## 2013-01-06 NOTE — Progress Notes (Signed)
PULMONARY  / CRITICAL CARE MEDICINE  Name: Shirley Bond MRN: [id number] DOB: 30-Mar-1924    ADMISSION DATE:  12/27/2012 CONSULTATION DATE:  01/04/13  REFERRING MD :  Adriana Reams  CHIEF COMPLAINT:  Septic shock  BRIEF PATIENT DESCRIPTION: This is an 79 y/o female who lives in a SNF was admitted on 2/1 with septic shock from C.diff.  PCCM consulted on 2/2 due to shock.  SIGNIFICANT EVENTS / STUDIES:  2/1 admission 2/2 transfer to ICU 2/3- off pressors  LINES / TUBES: 2/2 CVL >>  CULTURES: 2/2 blood >> 2/1 c.diff >> positive  ANTIBIOTICS: 2/1 vanc >> 2/1 flagyl >> 2/3 vanc enema>>>  SUBJECTIVE:  No pressors, pulled ngt out  VITAL SIGNS: Temp:  [97 F (36.1 C)-97.5 F (36.4 C)] 97.3 F (36.3 C) (02/04 0810) Pulse Rate:  [93-111] 94  (02/04 0810) Resp:  [15-31] 17  (02/04 0810) BP: (92-110)/(40-82) 103/60 mmHg (02/04 0810) SpO2:  [95 %-100 %] 100 % (02/04 0810) HEMODYNAMICS: CVP:  [5 mmHg-8 mmHg] 6 mmHg VENTILATOR SETTINGS:   INTAKE / OUTPUT: Intake/Output      02/03 0701 - 02/04 0700 02/04 0701 - 02/05 0700   I.V. (mL/kg) 2011.7 (38.5)    Other 100    IV Piggyback 200    Total Intake(mL/kg) 2311.7 (44.3)    Urine (mL/kg/hr) 395 (0.3) 100   Stool 500    Total Output 895 100   Net +1416.7 -100          PHYSICAL EXAMINATION: Gen: chronically ill appaering, NAD HEENT:  Dry, limited jvd PULM: Clear in all fields CV: RRR, no mgr, no JVD AB: BS active, tender to palpation Ext: trace edema remains Derm: no rash or skin breakdown Neuro: more awake, confused  LABS:  Lab 01/06/13 0446 01/05/13 2046 01/05/13 1246 01/05/13 0338 01/04/13 1715 01/04/13 1445 01/04/13 0836 01/03/13 0900 01/03/13 0234  HGB 10.6* -- -- 10.5* 10.9* -- -- -- --  WBC 12.5* -- -- 18.8* 21.4* -- -- -- --  PLT 79* -- -- 141* 181 -- -- -- --  NA 152* 151* 149* -- -- -- -- -- --  K 3.2* 3.9 -- -- -- -- -- -- --  CL 118* 121* 121* -- -- -- -- -- --  CO2 20 15* 11* -- -- -- -- -- --   GLUCOSE 127* 125* 107* -- -- -- -- -- --  BUN 85* 90* 87* -- -- -- -- -- --  CREATININE 3.33* 3.78* 4.02* -- -- -- -- -- --  CALCIUM 8.0* 8.1* 7.7* -- -- -- -- -- --  MG -- -- -- -- 2.4 -- 2.5 -- --  PHOS -- -- -- -- -- -- 6.7* -- --  AST -- -- -- -- 38* -- -- 35 --  ALT -- -- -- -- 15 -- -- 13 --  ALKPHOS -- -- -- -- 128* -- -- 123* --  BILITOT -- -- -- -- 0.1* -- -- 0.2* --  PROT -- -- -- -- 4.5* -- -- 5.9* --  ALBUMIN -- -- -- -- 1.3* -- -- 1.8* --  APTT -- -- -- -- -- -- -- -- --  INR -- -- -- -- -- -- -- -- --  LATICACIDVEN -- -- -- -- -- 2.8* -- 3.3* --  TROPONINI -- -- -- -- -- <0.30 -- -- --  PROCALCITON -- -- -- -- -- 46.67 -- -- --  PROBNP -- -- -- -- -- -- -- -- --  O2SATVEN -- -- -- -- -- -- -- -- --  PHART -- -- -- -- -- -- -- -- 7.436  PCO2ART -- -- -- -- -- -- -- -- 27.9*  PO2ART -- -- -- -- -- -- -- -- 152.0*    Lab 01/03/13 0803  GLUCAP 135*    2/1 CXR: thoracic spine hardware, lungs clear, normal cardiac sillhouette  ASSESSMENT / PLAN:  PULMONARY A: DNI, non AG acidosis from stool P:   -O2 as needed -bicarb for non AG, dc  CARDIOVASCULAR A: Sepsis - S/p shock - resolved with EGDT P:  -sepsis protocol -Keep  Euvolemic to slight positive -dc line when able  RENAL A:   AKI -  Baseline Cr 0.9 (10/2012), peaked at 5.4 on 2/1, now trending down. Urine output remains low with improved Cr. Hypernatremia  Hyperchloremia  Metabolic acidosis (NAG: from bicarb loss w/ diarrhea and NS resuscitation efforts).  P:   -dc bicarb -1.2 NS at 50, allow pos balance -chem in am , improving  GASTROINTESTINAL A:   c.diff P:   -replace NGt again per GI -may need GI to eval stricture, will call -avoid PPI -see ID  HEMATOLOGIC A:   Plat trending down, sepsis likely P:  -monitor for bleeding -cbc in am  -avoid lovenox -treat cdiff -scd  INFECTIOUS A:   Septic shock from c.diff colitis Off pressors 2 days now P:   -po vanc and flagyl -Continue  vanc enema with such significant cdiff, give 5 days then dc  ENDOCRINE A:   Mildly elevated TSH likely sick euthyroid P:   -repeat tsh in 1 week  NEUROLOGIC A:   Acute encephalopathy - likely from current infection, c/b acidosis and hypernatremia P:   -supportive care -NPO for now Code status:  Daughter, limited code will clarify for NCB   Looking a little better. Daughter updated at bedside. Has several metabolic derangements. Treat cdiff, to [facility] in am , will sign off, will talk to daughter about NCB. Dc bicarb, kidneys improved, needs gi for stricture.  01/06/2013, 10:05 AM   Mcarthur Rossetti. Tyson Alias, MD, FACP Pgr: [phone] [facility]

## 2013-01-06 NOTE — Progress Notes (Signed)
Subjective: Unable to have NGT placed.  Objective: Vital signs in last 24 hours: Temp:  [97 F (36.1 C)-97.5 F (36.4 C)] 97.4 F (36.3 C) (02/04 1237) Pulse Rate:  [93-111] 94  (02/04 0810) Resp:  [15-31] 17  (02/04 0810) BP: (92-108)/(51-82) 103/60 mmHg (02/04 0810) SpO2:  [96 %-100 %] 100 % (02/04 0810) Weight change:  Last BM Date: 01/06/13  PE: GEN:  Awake, tracks me with her eyes, demented and confused EXT:  Mittens on hands ABD:  Mild distention, hypoactive bowel sounds, soft  Lab Results: CBC    Component Value Date/Time   WBC 12.5* 01/06/2013 0446   RBC 3.54* 01/06/2013 0446   HGB 10.6* 01/06/2013 0446   HCT 30.9* 01/06/2013 0446   PLT 79* 01/06/2013 0446   MCV 87.3 01/06/2013 0446   MCH 29.9 01/06/2013 0446   MCHC 34.3 01/06/2013 0446   RDW 16.6* 01/06/2013 0446   LYMPHSABS 0.9 12/14/2012 2212   MONOABS 1.3* 12/16/2012 2212   EOSABS 0.0 12/13/2012 2212   BASOSABS 0.0 12/24/2012 2212   CMP     Component Value Date/Time   NA 153* 01/06/2013 1246   K 3.6 01/06/2013 1246   CL 119* 01/06/2013 1246   CO2 22 01/06/2013 1246   GLUCOSE 130* 01/06/2013 1246   BUN 80* 01/06/2013 1246   CREATININE 2.90* 01/06/2013 1246   CALCIUM 8.1* 01/06/2013 1246   PROT 4.5* 01/04/2013 1715   ALBUMIN 1.3* 01/04/2013 1715   AST 38* 01/04/2013 1715   ALT 15 01/04/2013 1715   ALKPHOS 128* 01/04/2013 1715   BILITOT 0.1* 01/04/2013 1715   GFRNONAA 14* 01/06/2013 1246   GFRAA 16* 01/06/2013 1246   Assessment:  1.  C. Diff colitis, recovering from septic shock. 2.  Failure-to-place NGT. 3.  History of esophageal stricture per chart.  Plan:  1.  In setting of recent C. Difficile colitis, and just now recovering from sepsis, I would not pursue endoscopy with goals of esophageal stricture diagnosis and/or dilatation. 2.  If patient is felt to need nasogastric tube for feeds and/or medication, I would ask radiology to perform this under fluoroscopic guidance.  Would not pursue endoscopic-guided NGT placement UNLESS  radiology-guided placement fails. 3.  Should patient's mental status and/or swallowing not improve over the course of this hospitalization, and should it be felt that patient would ultimately need a PEG tube, I would likewise ask radiology to perform this. 4.  Will sign-off; please call with any further questions.   Freddy Jaksch 01/06/2013, 2:09 PM

## 2013-01-06 NOTE — Progress Notes (Addendum)
Pt family very anxious about plan for Pt. GI did consult, did not see note. Smoothed over with family. VSS, will continue to monitor. 2 runs K today.

## 2013-01-06 NOTE — Progress Notes (Signed)
Utilization review completed.  

## 2013-01-06 NOTE — Progress Notes (Signed)
Clinical Social Work Department BRIEF PSYCHOSOCIAL ASSESSMENT 01/06/2013  Patient:  Shirley Bond, Shirley Bond     Account Number:  [id number]     Admit date:  Jan 31, 2013  Clinical Social Worker:  Lourdes Sledge  Date/Time:  01/06/2013 03:25 PM  Referred by:  RN  Date Referred:  01/06/2013 Referred for  SNF Placement   Other Referral:   Interview type:  Family Other interview type:   CSW completed assessment with pt daughter Shirley Bond [phone]    PSYCHOSOCIAL DATA Living Status:  FACILITY Admitted from facility:  [facility] Level of care:  Skilled Nursing Facility Primary support name:  Shirley Bond [phone] Primary support relationship to patient:  CHILD, ADULT Degree of support available:   Pt daughter present in room and appears actively involved in pt care.    CURRENT CONCERNS Current Concerns  Post-Acute Placement   Other Concerns:    SOCIAL WORK ASSESSMENT / PLAN CSW informed by nursing that pt was admitted from [facility].    CSW visited pt room where pt was unable to participate in assessment. CSW spoke with pt daughter who confirmed that pt was admitted from [facility] and the plan is for pt to return when medically ready. Pt daughter did not express any concern however apprecited CSW visit.    CSW confirmed with Marylene Land at [facility] that pt is able to return when medically ready.   Assessment/plan status:  Psychosocial Support/Ongoing Assessment of Needs Other assessment/ plan:   Information/referral to community resources:   Pt not in need of any resources at this time.    PATIENT'S/FAMILY'S RESPONSE TO PLAN OF CARE: Pt laying in bed unable to participate in assessment. Pt daughter present and agreeable for pt to return to [facility] at discharge.    CSW to remain following and will facilitate with dc back to [place].        Shirley Bond, MSW, Shirley Bond [phone]

## 2013-01-06 NOTE — Progress Notes (Signed)
*  PRELIMINARY RESULTS* Echocardiogram 2D Echocardiogram has been performed.  Shirley Bond 01/06/2013, 5:02 PM

## 2013-01-06 NOTE — Progress Notes (Signed)
SLP Cancellation Note  Patient Details Name: Shirley Bond MRN: [id number] DOB: 27-Aug-1924   Cancelled treatment:       Reason Eval/Treat Not Completed: Patient not medically ready. Pt still poorly arousable per RN. Pt also with known stricture with GI eval pending. Will sign off at this time. Please reorder when pt is again appropriate for PO intake. Thanks. Harlon Ditty, [place] CCC-SLP [phone]  [name] 01/06/2013, 11:43 AM

## 2013-01-06 NOTE — Progress Notes (Signed)
eLink Physician-Brief Progress Note Patient Name: Shirley Bond DOB: 26-Jun-1924 MRN: [id number]  Date of Service  01/06/2013   HPI/Events of Note  Hypokalemia   eICU Interventions  Potassium replaced   Intervention Category Minor Interventions: Electrolytes abnormality - evaluation and management  Tylisha Danis 01/06/2013, 11:56 PM

## 2013-01-06 NOTE — Progress Notes (Signed)
eLink Physician-Brief Progress Note Patient Name: Shirley Bond DOB: 12/09/1923 MRN: [id number]  Date of Service  01/06/2013   HPI/Events of Note   Hypokalemia  eICU Interventions  Potassium replaced   Intervention Category Minor Interventions: Electrolytes abnormality - evaluation and management  DETERDING,ELIZABETH 01/06/2013, 6:58 AM

## 2013-01-07 LAB — CBC WITH DIFFERENTIAL/PLATELET
Basophils Absolute: 0 10*3/uL (ref 0.0–0.1)
Eosinophils Absolute: 0 10*3/uL (ref 0.0–0.7)
HCT: 30.8 % — ABNORMAL LOW (ref 36.0–46.0)
Lymphocytes Relative: 12 % (ref 12–46)
Lymphs Abs: 1 10*3/uL (ref 0.7–4.0)
MCHC: 34.4 g/dL (ref 30.0–36.0)
Monocytes Absolute: 0.7 10*3/uL (ref 0.1–1.0)
Neutro Abs: 7 10*3/uL (ref 1.7–7.7)
RDW: 16.7 % — ABNORMAL HIGH (ref 11.5–15.5)

## 2013-01-07 LAB — BASIC METABOLIC PANEL
BUN: 80 mg/dL — ABNORMAL HIGH (ref 6–23)
BUN: 79 mg/dL — ABNORMAL HIGH (ref 6–23)
Calcium: 7.8 mg/dL — ABNORMAL LOW (ref 8.4–10.5)
Chloride: 121 mEq/L — ABNORMAL HIGH (ref 96–112)
Creatinine, Ser: 2.46 mg/dL — ABNORMAL HIGH (ref 0.50–1.10)
Creatinine, Ser: 2.32 mg/dL — ABNORMAL HIGH (ref 0.50–1.10)
GFR calc Af Amer: 19 mL/min — ABNORMAL LOW (ref >90)
GFR calc non Af Amer: 18 mL/min — ABNORMAL LOW (ref >90)
Glucose, Bld: 85 mg/dL (ref 70–99)
Sodium: 153 mEq/L — ABNORMAL HIGH (ref 135–145)

## 2013-01-07 LAB — STOOL CULTURE

## 2013-01-07 MED ORDER — SODIUM CHLORIDE 0.9 % IJ SOLN
INTRAMUSCULAR | Status: AC
  Administered 2013-01-07: 04:00:00
  Filled 2013-01-07: qty 10

## 2013-01-07 MED ORDER — SODIUM CHLORIDE 0.9 % IV BOLUS (SEPSIS)
250.0000 mL | Freq: Once | INTRAVENOUS | Status: AC
  Administered 2013-01-07: 250 mL via INTRAVENOUS

## 2013-01-07 MED ORDER — VANCOMYCIN 50 MG/ML ORAL SOLUTION
500.0000 mg | Freq: Four times a day (QID) | ORAL | Status: DC
  Administered 2013-01-07 – 2013-01-09 (×8): 500 mg via ORAL
  Filled 2013-01-07 (×12): qty 10

## 2013-01-07 NOTE — Evaluation (Signed)
Clinical/Bedside Swallow Evaluation Patient Details  Name: Shirley Bond MRN: [id number] Date of Birth: 10-18-1924  Today's Date: 01/07/2013 Time: 9528-4132 SLP Time Calculation (min): 15 min  Past Medical History:  Past Medical History  Diagnosis Date  . COPD (chronic obstructive pulmonary disease)   . Frequent UTI   . Hypertension   . Arthritis   . Osteoarthritis   . Hypercholesteremia   . Asthma   . Thyroid disease   . Environmental allergies   . Osteopenia   . Macular degeneration   . Hearing loss   . Constipation   . Back pain   . Dementia   . PONV (postoperative nausea and vomiting)   . Hypothyroidism   . Blood transfusion     " no reaction to transfusion "  . Seizures    Past Surgical History:  Past Surgical History  Procedure Date  . Kyphosis surgery   . Spinal fusion   . Rotator cuff repair     unsuccessful x 2  . Bladder surgery     with mesh & vaginal wall   HPI:  Shirley Bond is an 79 y.o. female with past medical history of COPD (chronic obstructive pulmonary disease); Frequent UTI; Hypertension; Arthritis; Osteoarthritis; Hypercholesteremia; Asthma; Thyroid disease; Environmental allergies; Osteopenia; Macular degeneration; Hearing loss; Constipation; Back pain; Dementia; PONV (postoperative nausea and vomiting); Hypothyroidism; Blood transfusion; and Seizures. Presented with 2 week hx of feeling poorly at first she was diagnosed with UTI and was treated with IM antibiotics. For 1 week she developed diarrhea with frequent episodes. She has not been eating anything for the past 5 days. Her mental status has declined and she has been becoming progressively more confused. In ER she was found to be dehydrated with Cr of 5.3 and WBC of 21.7. Hospitalist service to admit for IV rehydration and diarrhea work up. Of note she has hx of esophageal stricture and gets dilatations done about once a year. Her last one was about 1 year ago.  Patient initially  evaluated by SLP and placed on a diet; however due to inability to participate was discharged and re-ordered today for Bedside Swallow Evaluation.    Assessment / Plan / Recommendation Clinical Impression  Patient with history of esophageal stricture, now presenting with a suspected pharyngeal based dysphagia that is characterized by delay swallow initiation and intermittent s/s of aspiration with all tested consistencies.  SLP initiated treatment by providing max assist verbal, and tactile cues to maintain arousal and assist with self feeding.  Given these factors an objective swallow assessment is recommended to asses for presence of penetration and/or aspiration and determine safest p.o. intake.       Aspiration Risk  Severe    Diet Recommendation NPO;Ice chips PRN after oral care   Postural Changes and/or Swallow Maneuvers: Seated upright 90 degrees    Other  Recommendations Recommended Consults: MBS Oral Care Recommendations: Oral care QID   Follow Up Recommendations  Skilled Nursing facility    Frequency and Duration  Defer until after completion of MBSS     Pertinent Vitals/Pain Reported sore mouth and throat; RN aware    Swallow Study Prior Functional Status   Family reports WFL about 3 weeks ago with regular textures and thin liquids.    General Date of Onset: 12/06/2012 HPI: Shirley Bond is an 79 y.o. female with past medical history of COPD (chronic obstructive pulmonary disease); Frequent UTI; Hypertension; Arthritis; Osteoarthritis; Hypercholesteremia; Asthma; Thyroid disease; Environmental allergies; Osteopenia; Macular degeneration;  Hearing loss; Constipation; Back pain; Dementia; PONV (postoperative nausea and vomiting); Hypothyroidism; Blood transfusion; and Seizures. Presented with 2 week hx of feeling poorly at first she was diagnosed with UTI and was treated with IM antibiotics. For 1 week she developed diarrhea with frequent episodes. She has not been eating  anything for the past 5 days. Her mental status has declined and she has been becoming progressively more confused. In ER she was found to be dehydrated with Cr of 5.3 and WBC of 21.7. Hospitalist service to admit for IV rehydration and diarrhea work up. Of note she has hx of esophageal stricture and gets dilatations done about once a year. Her last one was about 1 year ago.  Patient initially evaluated by SLP and placed on a diet; however due to inability to participate was discharged and re-ordered today for Bedside Swallow Evaluation.  Type of Study: Bedside swallow evaluation Previous Swallow Assessment: BSE 01/03/13 Diet Prior to this Study: IV;NPO Temperature Spikes Noted: No Respiratory Status: Room air History of Recent Intubation: No Behavior/Cognition: Alert;Cooperative;Requires cueing Oral Cavity - Dentition: Dentures, top;Dentures, bottom Self-Feeding Abilities: Needs assist Patient Positioning: Upright in bed Baseline Vocal Quality: Clear Volitional Cough: Weak Volitional Swallow: Able to elicit    Oral/Motor/Sensory Function Overall Oral Motor/Sensory Function: Appears within functional limits for tasks assessed   Ice Chips Ice chips: Impaired Presentation: Spoon Oral Phase Impairments: Poor awareness of bolus Oral Phase Functional Implications: Prolonged oral transit Pharyngeal Phase Impairments: Suspected delayed Swallow   Thin Liquid Thin Liquid: Impaired Presentation: Cup;Self Fed Pharyngeal  Phase Impairments: Suspected delayed Swallow;Multiple swallows;Cough - Delayed    Nectar Thick Nectar Thick Liquid: Not tested   Honey Thick Honey Thick Liquid: Not tested   Puree Puree: Impaired Presentation: Spoon Pharyngeal Phase Impairments: Suspected delayed Swallow;Multiple swallows;Cough - Delayed   Solid   GO    Solid: Not tested      Fae Pippin, M.A., CCC-SLP [phone]   Shirley Bond 01/07/2013,5:04 PM

## 2013-01-07 NOTE — Progress Notes (Signed)
[facility] Progress Note [place] TEAM 1 - Stepdown/ICU TEAM   Shirley Bond UJW:[id number] DOB: 06-06-1924 DOA: 12/29/2012 PCP: Terald Sleeper, MD  Brief narrative: 79 y/o female presented with 2 week hx of feeling poorly - at first she was diagnosed with UTI and was treated with IM antibiotics. For 1 week she developed diarrhea with frequent episodes. She had not been eating anything for 5 days. Her mental status declined and she had been becoming progressively more confused and ultimately minimally responsive. In ER she was found to be dehydrated with Cr of 5.3 and WBC of 21.7.  Of note she has a hx of esophageal strictures and gets dilatations done about once a year. Her last one was about 1 year ago.   After her admission she was noted to be in septic shock from C.diff. PCCM was consulted on 2/2 and assumed care of the pt until 01/07/2013.  Assessment/Plan:  Septic and hypovolemic shock Due to C diff colitis - stabilizing hemodynamically   C diff colitis Cont IV flagyl and oral vanc - clinically improving   Acute kidney injury crt is slowly improving w/ volume expansion and hemodynamic stability - follow trend   Hypernatremia Due to hypovolemia as well as true DH/free water deficit - cont hypotonic IVF and follow   Metabolic acidosis from bicarb loss w/ diarrhea, acute renal failure, and NS resuscitation efforts - resolved   Thrombocytopnia Likely due to acute infection - follow trend - avoid heparin products with plt count <50  Acute encephalopathy Slowly improving - likely due to toxic metabolic enceph in setting of severe infection and acute renal failure  Mildly elevated TSH likely sick euthyroid Recheck TSH as outpt when medically stable   Reported hx of esophageal strictures GI has seen - no plans for endo in setting of active GI infection - ask SLP to eval today - begin clear liquids - no reason to suspect dysphagia/aspiration other than  generalized weakness - with improvement in mental status, hopefully we can avoid NG tube   Code Status: Limited Code: pressors OK, but no CPR, no shocks, no mechanical ventilation Family Communication: spoke w/ daughter at bedside Disposition Plan: SDU  Consultants: PCCM GI - Outlaw  Procedures: 2/2 CVL >> 2/4 TTE - poor acustic windows - no useful information obtained  Antibiotics: 2/1 vanc >>  2/1 flagyl >>  DVT prophylaxis: SCDs  HPI/Subjective: The patient is awake but somewhat lethargic.  She is able to answer directed questions.  I spoke at length with the daughter at the bedside.  The patient denies chest pain shortness of breath or abdominal pain.   Objective: Blood pressure 116/88, pulse 104, temperature 97.6 F (36.4 C), temperature source Axillary, resp. rate 18, height 5\' 2"  (1.575 m), weight 52.2 kg (115 lb 1.3 oz), SpO2 100.00%.  Intake/Output Summary (Last 24 hours) at 01/07/13 1117 Last data filed at 01/07/13 5621  Gross per 24 hour  Intake  487.5 ml  Output    455 ml  Net   32.5 ml     Exam: General: No acute respiratory distress - somnolent Lungs: Clear to auscultation bilaterally without wheezes or crackles Cardiovascular: Regular rate and rhythm without murmur gallop or rub normal S1 and S2 Abdomen: thin, nondistended, soft, bowel sounds positive, no rebound, no ascites, no appreciable mass Extremities: No significant cyanosis, clubbing, or edema bilateral lower extremities  Data Reviewed: Basic Metabolic Panel:  Lab 01/07/13 3086 01/06/13 2046 01/06/13 1246 01/06/13 0446 01/05/13 2046 01/04/13 1715  01/04/13 0836  NA 153* 150* 153* 152* 151* -- --  K 4.4 3.3* 3.6 3.2* 3.9 -- --  CL 121* 118* 119* 118* 121* -- --  CO2 21 21 22 20  15* -- --  GLUCOSE 85 84 130* 127* 125* -- --  BUN 80* 75* 80* 85* 90* -- --  CREATININE 2.46* 2.49* 2.90* 3.33* 3.78* -- --  CALCIUM 7.9* 7.4* 8.1* 8.0* 8.1* -- --  MG -- -- -- -- -- 2.4 2.5  PHOS -- -- -- -- --  -- 6.7*   Liver Function Tests:  Lab 01/04/13 1715 01/03/13 0900  AST 38* 35  ALT 15 13  ALKPHOS 128* 123*  BILITOT 0.1* 0.2*  PROT 4.5* 5.9*  ALBUMIN 1.3* 1.8*   CBC:  Lab 01/07/13 0430 01/06/13 0446 01/05/13 0338 01/04/13 1715 01/03/13 0900 2013/01/15 2212  WBC 8.7 12.5* 18.8* 21.4* 22.5* --  NEUTROABS 7.0 -- -- -- -- 19.5*  HGB 10.6* 10.6* 10.5* 10.9* 13.1 --  HCT 30.8* 30.9* 31.5* 32.1* 38.0 --  MCV 87.7 87.3 90.3 89.4 89.6 --  PLT 48* 79* 141* 181 327 --   Cardiac Enzymes:  Lab 01/04/13 1445  CKTOTAL --  CKMB --  CKMBINDEX --  TROPONINI <0.30   CBG:  Lab 01/03/13 0803  GLUCAP 135*    Recent Results (from the past 240 hour(s))  MRSA PCR SCREENING     Status: Normal   Collection Time   01/03/13  3:36 AM      Component Value Range Status Comment   MRSA by PCR NEGATIVE  NEGATIVE Final   CLOSTRIDIUM DIFFICILE BY PCR     Status: Abnormal   Collection Time   01/03/13 10:37 AM      Component Value Range Status Comment   C difficile by pcr POSITIVE (*) NEGATIVE Final   STOOL CULTURE     Status: Normal (Preliminary result)   Collection Time   01/03/13 10:38 AM      Component Value Range Status Comment   Specimen Description STOOL   Final    Special Requests NONE   Final    Culture NO SUSPICIOUS COLONIES, CONTINUING TO HOLD   Final    Report Status PENDING   Incomplete   CULTURE, BLOOD (ROUTINE X 2)     Status: Normal (Preliminary result)   Collection Time   01/04/13  1:50 PM      Component Value Range Status Comment   Specimen Description BLOOD RIGHT ARM   Final    Special Requests BOTTLES DRAWN AEROBIC AND ANAEROBIC 10CC   Final    Culture  Setup Time 01/05/2013 15:28   Final    Culture     Final    Value:        BLOOD CULTURE RECEIVED NO GROWTH TO DATE CULTURE WILL BE HELD FOR 5 DAYS BEFORE ISSUING A FINAL NEGATIVE REPORT   Report Status PENDING   Incomplete   CULTURE, BLOOD (ROUTINE X 2)     Status: Normal (Preliminary result)   Collection Time   01/04/13  2:00 PM       Component Value Range Status Comment   Specimen Description BLOOD RIGHT ARM   Final    Special Requests BOTTLES DRAWN AEROBIC ONLY 10CC   Final    Culture  Setup Time 01/05/2013 15:27   Final    Culture     Final    Value:        BLOOD CULTURE RECEIVED NO GROWTH TO DATE  CULTURE WILL BE HELD FOR 5 DAYS BEFORE ISSUING A FINAL NEGATIVE REPORT   Report Status PENDING   Incomplete      Studies:  Recent x-ray studies have been reviewed in detail by the Attending Physician  Scheduled Meds:  Reviewed in detail by the Attending Physician   Lonia Blood, MD [facility] Office  [phone] Pager [phone]  On-Call/Text Page:      [email or web address]      password [email or web address]  If 7PM-7AM, please contact night-coverage [email or web address] Password [email or web address] 01/07/2013, 11:17 AM   LOS: 5 days

## 2013-01-07 NOTE — Progress Notes (Signed)
ANTIBIOTIC CONSULT NOTE - Follow Up  Pharmacy Consult for Flagyl Indication: Sepsis 2/2 to C-diff  Allergies  Allergen Reactions  . Codeine Other (See Comments)    Unknown.  [name] Hyomax (Hyoscyamine Sulfate) Other (See Comments)    unknown  . Other     Negative reactions to narcotics/opiates  . Oxycodone Hcl Er Other (See Comments)    unknown  . Sulfa Antibiotics Other (See Comments)    unknown   Patient Measurements: Height: 5\' 2"  (157.5 cm) (per daughter) Weight: 115 lb 1.3 oz (52.2 kg) IBW/kg (Calculated) : 50.1   Vital Signs: Temp: 97.6 F (36.4 C) (02/05 0837) Temp src: Axillary (02/05 0837) BP: 89/68 mmHg (02/05 1241) Pulse Rate: 78  (02/05 1241) Intake/Output from previous day: 02/04 0701 - 02/05 0700 In: 587.5 [I.V.:387.5; IV Piggyback:200] Out: 655 [Urine:455; Stool:200] Intake/Output from this shift: Total I/O In: -  Out: 100 [Urine:100]  Labs:  Basename 01/07/13 0430 01/06/13 2046 01/06/13 1246 01/06/13 0446 01/05/13 0338  WBC 8.7 -- -- 12.5* 18.8*  HGB 10.6* -- -- 10.6* 10.5*  PLT 48* -- -- 79* 141*  LABCREA -- -- -- -- --  CREATININE 2.46* 2.49* 2.90* -- --   Estimated Creatinine Clearance: 13 ml/min (by C-G formula based on Cr of 2.46). No results found for this basename: VANCOTROUGH:2,VANCOPEAK:2,VANCORANDOM:2,GENTTROUGH:2,GENTPEAK:2,GENTRANDOM:2,TOBRATROUGH:2,TOBRAPEAK:2,TOBRARND:2,AMIKACINPEAK:2,AMIKACINTROU:2,AMIKACIN:2, in the last 72 hours   Microbiology: Recent Results (from the past 720 hour(s))  MRSA PCR SCREENING     Status: Normal   Collection Time   01/03/13  3:36 AM      Component Value Range Status Comment   MRSA by PCR NEGATIVE  NEGATIVE Final   CLOSTRIDIUM DIFFICILE BY PCR     Status: Abnormal   Collection Time   01/03/13 10:37 AM      Component Value Range Status Comment   C difficile by pcr POSITIVE (*) NEGATIVE Final   STOOL CULTURE     Status: Normal   Collection Time   01/03/13 10:38 AM      Component Value Range Status  Comment   Specimen Description STOOL   Final    Special Requests NONE   Final    Culture     Final    Value: NO SALMONELLA, SHIGELLA, CAMPYLOBACTER, YERSINIA, OR E.COLI 0157:H7 ISOLATED   Report Status 01/07/2013 FINAL   Final   CULTURE, BLOOD (ROUTINE X 2)     Status: Normal (Preliminary result)   Collection Time   01/04/13  1:50 PM      Component Value Range Status Comment   Specimen Description BLOOD RIGHT ARM   Final    Special Requests BOTTLES DRAWN AEROBIC AND ANAEROBIC 10CC   Final    Culture  Setup Time 01/05/2013 15:28   Final    Culture     Final    Value:        BLOOD CULTURE RECEIVED NO GROWTH TO DATE CULTURE WILL BE HELD FOR 5 DAYS BEFORE ISSUING A FINAL NEGATIVE REPORT   Report Status PENDING   Incomplete   CULTURE, BLOOD (ROUTINE X 2)     Status: Normal (Preliminary result)   Collection Time   01/04/13  2:00 PM      Component Value Range Status Comment   Specimen Description BLOOD RIGHT ARM   Final    Special Requests BOTTLES DRAWN AEROBIC ONLY 10CC   Final    Culture  Setup Time 01/05/2013 15:27   Final    Culture     Final  Value:        BLOOD CULTURE RECEIVED NO GROWTH TO DATE CULTURE WILL BE HELD FOR 5 DAYS BEFORE ISSUING A FINAL NEGATIVE REPORT   Report Status PENDING   Incomplete     Medical History: Past Medical History  Diagnosis Date  . COPD (chronic obstructive pulmonary disease)   . Frequent UTI   . Hypertension   . Arthritis   . Osteoarthritis   . Hypercholesteremia   . Asthma   . Thyroid disease   . Environmental allergies   . Osteopenia   . Macular degeneration   . Hearing loss   . Constipation   . Back pain   . Dementia   . PONV (postoperative nausea and vomiting)   . Hypothyroidism   . Blood transfusion     " no reaction to transfusion "  . Seizures    Assessment: 79 yo F w recent hx of antibiotic use and diarrhea for the past 5 days, C-diff PCR positive,  leukocytosis resolving.  Blood cultures are negative thus far.  She continues on  IV Flagyl, Vancomycin both oral and via enema.  Pt. Is in ARF, with crcl ~ 34ml/min  Plan:  Continue Flagyl 500mg  IV Q 8 hrs Continue Vancomycin Enema 500mg  PR 4 time daily  We will continue follow up clinical course    Nadara Mustard, PharmD., MS Clinical Pharmacist Pager:  [phone] Thank you for allowing pharmacy to be part of this patients care team. 01/07/2013,1:18 PM

## 2013-01-07 NOTE — Progress Notes (Signed)
INITIAL NUTRITION ASSESSMENT  DOCUMENTATION CODES Per approved criteria  -Severe malnutrition in the context of acute illness or injury   INTERVENTION:  Diet advancement per SLP/MD as patient becomes more alert and able to participate in swallowing evaluation with SLP.  If unable to safely advance diet, consult RD for TF initiation and management.  NUTRITION DIAGNOSIS: Inadequate oral intake related to decreased alertness and altered GI function as evidenced by NPO status.   Goal: Intake to meet >90% of estimated nutrition needs.  Monitor:  PO diet advancement, PO intake, labs, weight trend.  Reason for Assessment: Low Braden  79 y.o. female  Admitting Dx: C. difficile colitis  ASSESSMENT: Patient was admitted on 2/1 with septic shock from C.diff.  NGT unable to be placed yesterday.  Noted history of esophageal stricture.  GI is following; If NGT is needed, GI recommends radiology to perform NGT placement under fluoroscopic guidance.   S/P bedside swallow evaluation with SLP on 2/1; recommended Dysphagia 1-honey diet at that time.  Currently NPO due to increased lethargy with increased risk for aspiration.    Per discussion with patient's daughter, patient has had esophageal dilation once a year for the past two years; she thinks she is due for another dilation soon.    Daughter reports that patient has been eating poorly for the past 3 weeks (less than 25% of usual intake).  No weight loss noted.  Weight loss could be masked by fluid status. Patient was dehydrated on admission and has since been rehydrated.  Usual weight per daughter is 116-117 lb.  Nutrition Focused Physical Exam:  Subcutaneous Fat:  Orbital Region: mild-moderate depletion Upper Arm Region: mild-moderate depletion Thoracic and Lumbar Region: NA  Muscle:  Temple Region: mild-moderate depletion Clavicle Bone Region: mild-moderate depletion Clavicle and Acromion Bone Region: mild-moderate  depletion Scapular Bone Region: NA Dorsal Hand: NA Patellar Region: mild-moderate depletion Anterior Thigh Region: mild-moderate depletion Posterior Calf Region: mild-moderate depletion  Edema: NA  Pt meets criteria for severe MALNUTRITION in the context of acute illness as evidenced by intake </= 50% of estimated energy requirement for >/= 5 days, moderate muscle loss, and moderate subcutaneous fat loss.   Height: Ht Readings from Last 1 Encounters:  01/03/13 5\' 2"  (1.575 m)    Weight: Wt Readings from Last 1 Encounters:  01/03/13 115 lb 1.3 oz (52.2 kg)   Ideal Body Weight: 50 kg  % Ideal Body Weight: 104%  Wt Readings from Last 10 Encounters:  01/03/13 115 lb 1.3 oz (52.2 kg)  04/07/12 117 lb 8.1 oz (53.3 kg)    Usual Body Weight: 117 lb  % Usual Body Weight: 98%  BMI:  Body mass index is 21.05 kg/(m^2).  Estimated Nutritional Needs: Kcal: 1350-1550 Protein: 75-85 gm Fluid: 1.4-1.6 L  Skin: excoriated perineum  Diet Order: NPO  EDUCATION NEEDS: -Education not appropriate at this time   Intake/Output Summary (Last 24 hours) at 01/07/13 0950 Last data filed at 01/07/13 0837  Gross per 24 hour  Intake  487.5 ml  Output    455 ml  Net   32.5 ml    Last BM: 2/4  (diarrhea related to C. Diff. Colitis)  Labs:   Lab 01/07/13 0430 01/06/13 2046 01/06/13 1246 01/04/13 1715 01/04/13 0836  NA 153* 150* 153* -- --  K 4.4 3.3* 3.6 -- --  CL 121* 118* 119* -- --  CO2 21 21 22  -- --  BUN 80* 75* 80* -- --  CREATININE 2.46* 2.49* 2.90* -- --  CALCIUM 7.9* 7.4* 8.1* -- --  MG -- -- -- 2.4 2.5  PHOS -- -- -- -- 6.7*  GLUCOSE 85 84 130* -- --    CBG (last 3)  No results found for this basename: GLUCAP:3 in the last 72 hours  Scheduled Meds:   . acidophilus  1 capsule Oral BID  . budesonide-formoterol  2 puff Inhalation BID  . divalproex  250 mg Oral QHS  . DULoxetine  60 mg Oral Daily  . folic acid  1 mg Oral Daily  . Gerhardt's butt cream   Topical  TID  . ipratropium  2 spray Each Nare BID  . levothyroxine  50 mcg Oral Daily  . metronidazole  500 mg Intravenous Q8H  . metronidazole  500 mg Intravenous Once  . OLANZapine  2.5 mg Oral BID  . olopatadine  1 drop Both Eyes BID  . potassium chloride  40 mEq Per Tube Once  . sodium chloride  25 mL/kg Intravenous Once  . sodium chloride  500 mL Intravenous Once  . sodium chloride  3 mL Intravenous Q12H  . vancomycin  500 mg Per Tube Q6H  . vancomycin (VANCOCIN) rectal ENEMA  500 mg Rectal Q6H    Continuous Infusions:   . sodium chloride 50 mL/hr at 01/07/13 0800  . sodium chloride      Past Medical History  Diagnosis Date  . COPD (chronic obstructive pulmonary disease)   . Frequent UTI   . Hypertension   . Arthritis   . Osteoarthritis   . Hypercholesteremia   . Asthma   . Thyroid disease   . Environmental allergies   . Osteopenia   . Macular degeneration   . Hearing loss   . Constipation   . Back pain   . Dementia   . PONV (postoperative nausea and vomiting)   . Hypothyroidism   . Blood transfusion     " no reaction to transfusion "  . Seizures     Past Surgical History  Procedure Date  . Kyphosis surgery   . Spinal fusion   . Rotator cuff repair     unsuccessful x 2  . Bladder surgery     with mesh & vaginal wall    Joaquin Courts, RD, LDN, CNSC Pager# [phone] After Hours Pager# [phone]

## 2013-01-08 ENCOUNTER — Inpatient Hospital Stay (HOSPITAL_COMMUNITY): Payer: Medicare Other

## 2013-01-08 LAB — CBC
Platelets: 28 10*3/uL — CL (ref 150–400)
RBC: 3.38 MIL/uL — ABNORMAL LOW (ref 3.87–5.11)
RDW: 17.2 % — ABNORMAL HIGH (ref 11.5–15.5)
WBC: 7.5 10*3/uL (ref 4.0–10.5)

## 2013-01-08 LAB — COMPREHENSIVE METABOLIC PANEL
Alkaline Phosphatase: 68 U/L (ref 39–117)
BUN: 75 mg/dL — ABNORMAL HIGH (ref 6–23)
CO2: 18 mEq/L — ABNORMAL LOW (ref 19–32)
Chloride: 119 mEq/L — ABNORMAL HIGH (ref 96–112)
Creatinine, Ser: 1.96 mg/dL — ABNORMAL HIGH (ref 0.50–1.10)
GFR calc Af Amer: 25 mL/min — ABNORMAL LOW (ref >90)
GFR calc non Af Amer: 22 mL/min — ABNORMAL LOW (ref >90)
Glucose, Bld: 82 mg/dL (ref 70–99)
Total Bilirubin: 0.1 mg/dL — ABNORMAL LOW (ref 0.3–1.2)

## 2013-01-08 MED ORDER — SODIUM CHLORIDE 0.45 % IV SOLN
Freq: Once | INTRAVENOUS | Status: AC
  Administered 2013-01-08: 01:00:00 via INTRAVENOUS

## 2013-01-08 MED ORDER — DEXTROSE 5 % IV SOLN
INTRAVENOUS | Status: DC
  Administered 2013-01-08 (×2): 100 mL via INTRAVENOUS
  Administered 2013-01-09: 05:00:00 via INTRAVENOUS

## 2013-01-08 MED ORDER — ALBUTEROL SULFATE (5 MG/ML) 0.5% IN NEBU
2.5000 mg | INHALATION_SOLUTION | Freq: Four times a day (QID) | RESPIRATORY_TRACT | Status: DC | PRN
  Administered 2013-01-08 – 2013-01-13 (×3): 2.5 mg via RESPIRATORY_TRACT
  Filled 2013-01-08 (×3): qty 0.5

## 2013-01-08 MED ORDER — SODIUM CHLORIDE 0.9 % IJ SOLN
INTRAMUSCULAR | Status: AC
  Administered 2013-01-08: 20 mL
  Filled 2013-01-08: qty 30

## 2013-01-08 MED ORDER — SODIUM CHLORIDE 0.9 % IJ SOLN
INTRAMUSCULAR | Status: AC
  Administered 2013-01-08: 10 mL
  Filled 2013-01-08: qty 20

## 2013-01-08 NOTE — Procedures (Signed)
Objective Swallowing Evaluation: Modified Barium Swallowing Study  Patient Details  Name: Shirley Bond MRN: [id number] Date of Birth: 1924-05-21  Today's Date: 01/08/2013 Time: 8295-6213 SLP Time Calculation (min): 29 min  Past Medical History:  Past Medical History  Diagnosis Date  . COPD (chronic obstructive pulmonary disease)   . Frequent UTI   . Hypertension   . Arthritis   . Osteoarthritis   . Hypercholesteremia   . Asthma   . Thyroid disease   . Environmental allergies   . Osteopenia   . Macular degeneration   . Hearing loss   . Constipation   . Back pain   . Dementia   . PONV (postoperative nausea and vomiting)   . Hypothyroidism   . Blood transfusion     " no reaction to transfusion "  . Seizures    Past Surgical History:  Past Surgical History  Procedure Date  . Kyphosis surgery   . Spinal fusion   . Rotator cuff repair     unsuccessful x 2  . Bladder surgery     with mesh & vaginal wall   HPI:  Shirley Bond is an 79 y.o. female with past medical history of COPD (chronic obstructive pulmonary disease); Frequent UTI; Hypertension; Arthritis; Osteoarthritis; Hypercholesteremia; Asthma; Thyroid disease; Environmental allergies; Osteopenia; Macular degeneration; Hearing loss; Constipation; Back pain; Dementia; PONV (postoperative nausea and vomiting); Hypothyroidism; Blood transfusion; and Seizures. Presented with 2 week hx of feeling poorly at first she was diagnosed with UTI and was treated with IM antibiotics. For 1 week she developed diarrhea with frequent episodes. She has not been eating anything for the past 5 days. Her mental status has declined and she has been becoming progressively more confused. In ER she was found to be dehydrated with Cr of 5.3 and WBC of 21.7. Hospitalist service to admit for IV rehydration and diarrhea work up. Of note she has hx of esophageal stricture and gets dilatations done about once a year. Her last one was about 1 year  ago.  Patient initially evaluated by SLP and placed on a diet; however due to inability to participate was discharged and re-ordered, BSE completed yesterday showed signs of aspiration with all textures. MBS to objectively evaluate function     Assessment / Plan / Recommendation Clinical Impression  Dysphagia Diagnosis: Suspected primary esophageal dysphagia;Moderate pharyngeal phase dysphagia;Moderate oral phase dysphagia  Clinical impression: Pt presents with moderate deficits throughout swallow mechanism, made severe by pts poor mentation, poorly sustained attention to PO and generalized weakness. Therapeutic intervention required to make pt aware of PO presentations, unable to feed herself today despite assist. Oral manipulation of bolus poor with spillage to pyriform sinsues with liquids and significant delay in initation due to sluggish mentation and sensory deficits. Initial swallow does not result in aspiration before the swallow, but weakness of base of tongue, pharyngeal contraction and laryngeal elvation results in moderate to severe residuals, increased with thicker textures, that penetrate the airway repeatedly and are aspirated with a delayed and ineffective cough response. Consumption of PO at this time with any texture would result in high aspiration risk. At least mentation would need to consistently improve prior to safe PO intake. Given esophageal deficits and baseline dementia, pt would make a very poor candidate for long term alternate nutrition. Consider short term alternate nutrition (though this has not been successful so far) vs POs for comfort with known risk of aspiration, depending of prognosis for timely recovery.    Treatment Recommendation  Therapy as outlined in treatment plan below    Diet Recommendation NPO;Ice chips PRN after oral care   Medication Administration: Via alternative means    Other  Recommendations Oral Care Recommendations: Oral care QID   Follow Up  Recommendations  Skilled Nursing facility    Frequency and Duration min 2x/week  2 weeks   Pertinent Vitals/Pain NA    SLP Swallow Goals Goal #3: Pt will demonstrate awareness of and sustain attention to puree trials for 1 minute with min verbal cues. Goal #4: Pt will follow commands to swallow puree trials twice/clear throat as needed with min verbal cues.    General HPI: Shirley Bond is an 79 y.o. female with past medical history of COPD (chronic obstructive pulmonary disease); Frequent UTI; Hypertension; Arthritis; Osteoarthritis; Hypercholesteremia; Asthma; Thyroid disease; Environmental allergies; Osteopenia; Macular degeneration; Hearing loss; Constipation; Back pain; Dementia; PONV (postoperative nausea and vomiting); Hypothyroidism; Blood transfusion; and Seizures. Presented with 2 week hx of feeling poorly at first she was diagnosed with UTI and was treated with IM antibiotics. For 1 week she developed diarrhea with frequent episodes. She has not been eating anything for the past 5 days. Her mental status has declined and she has been becoming progressively more confused. In ER she was found to be dehydrated with Cr of 5.3 and WBC of 21.7. Hospitalist service to admit for IV rehydration and diarrhea work up. Of note she has hx of esophageal stricture and gets dilatations done about once a year. Her last one was about 1 year ago.  Patient initially evaluated by SLP and placed on a diet; however due to inability to participate was discharged and re-ordered, BSE completed yesterday showed signs of aspiration with all textures. MBS to objectively evaluate function Type of Study: Modified Barium Swallowing Study Reason for Referral: Objectively evaluate swallowing function Previous Swallow Assessment: BSE 01/03/13 Diet Prior to this Study: NPO Temperature Spikes Noted: No Respiratory Status: Room air History of Recent Intubation: No Behavior/Cognition: Lethargic;Doesn't follow  directions;Distractible;Confused Oral Cavity - Dentition: Dentures, top;Dentures, bottom Oral Motor / Sensory Function: Impaired - see Bedside swallow eval Self-Feeding Abilities: Total assist Patient Positioning: Upright in chair Baseline Vocal Quality: Hoarse Volitional Cough: Weak Volitional Swallow: Unable to elicit Anatomy: Within functional limits Pharyngeal Secretions: Not observed secondary MBS    Reason for Referral Objectively evaluate swallowing function   Oral Phase Oral Preparation/Oral Phase Oral Phase: Impaired Oral - Nectar Oral - Nectar Teaspoon: Piecemeal swallowing;Lingual/palatal residue;Pocketing in anterior sulcus;Left pocketing in lateral sulci;Right pocketing in lateral sulci;Reduced posterior propulsion;Weak lingual manipulation;Left anterior bolus loss;Right anterior bolus loss Oral - Thin Oral - Thin Cup: Piecemeal swallowing;Lingual/palatal residue;Pocketing in anterior sulcus;Left pocketing in lateral sulci;Right pocketing in lateral sulci;Reduced posterior propulsion;Weak lingual manipulation;Left anterior bolus loss;Right anterior bolus loss Oral - Solids Oral - Puree: Piecemeal swallowing;Lingual/palatal residue;Pocketing in anterior sulcus;Left pocketing in lateral sulci;Right pocketing in lateral sulci;Reduced posterior propulsion;Weak lingual manipulation;Left anterior bolus loss;Right anterior bolus loss   Pharyngeal Phase Pharyngeal Phase Pharyngeal Phase: Impaired Pharyngeal - Nectar Pharyngeal - Nectar Teaspoon: Delayed swallow initiation;Premature spillage to pyriform sinuses;Reduced pharyngeal peristalsis;Reduced epiglottic inversion;Reduced anterior laryngeal mobility;Reduced laryngeal elevation;Reduced airway/laryngeal closure;Reduced tongue base retraction;Penetration/Aspiration before swallow;Penetration/Aspiration after swallow;Moderate aspiration;Pharyngeal residue - valleculae;Pharyngeal residue - pyriform sinuses Penetration/Aspiration details  (nectar teaspoon): Material enters airway, passes BELOW cords and not ejected out despite cough attempt by patient;Material enters airway, CONTACTS cords and not ejected out Pharyngeal - Thin Pharyngeal - Thin Cup: Delayed swallow initiation;Premature spillage to pyriform sinuses;Reduced pharyngeal peristalsis;Reduced epiglottic inversion;Reduced  anterior laryngeal mobility;Reduced laryngeal elevation;Reduced airway/laryngeal closure;Reduced tongue base retraction;Penetration/Aspiration before swallow;Penetration/Aspiration after swallow;Moderate aspiration;Pharyngeal residue - valleculae;Pharyngeal residue - pyriform sinuses Penetration/Aspiration details (thin cup): Material enters airway, passes BELOW cords and not ejected out despite cough attempt by patient;Material enters airway, CONTACTS cords and not ejected out Pharyngeal - Solids Pharyngeal - Puree: Delayed swallow initiation;Premature spillage to pyriform sinuses;Premature spillage to valleculae;Reduced pharyngeal peristalsis;Reduced epiglottic inversion;Reduced anterior laryngeal mobility;Reduced laryngeal elevation;Reduced airway/laryngeal closure;Reduced tongue base retraction;Pharyngeal residue - valleculae  Cervical Esophageal Phase    GO    Cervical Esophageal Phase Cervical Esophageal Phase: Impaired Cervical Esophageal Phase - Comment Cervical Esophageal Comment: appearance of mild stasis in distal esophagus with torturous appearance. No ragiologist present to confirm.         Harlon Ditty, [place] CCC-SLP [phone]  [name] 01/08/2013, 10:38 AM

## 2013-01-08 NOTE — Progress Notes (Signed)
CRITICAL VALUE ALERT  Critical value received:  Platelets 28   Date of notification:  01/08/13  Time of notification:  0612  Critical value read back:yes  Nurse who received alert: Kathlene Cote  MD notified (1st page):  Kirtland Bouchard Schorr  Time of first page:  [phone]  MD notified (2nd page):  Time of second page:  Responding MD:  Merdis Delay  Time MD responded:  [id number], No orders received. Will pass along to day shift RN

## 2013-01-08 NOTE — Progress Notes (Signed)
Patient's UOP from 2000-0000 was 75ml. Amber colored and very concentrated. Notified K. Schorr. Order received for 500 ml bolus. Will continue to monitor.   Kathlene Cote A RN

## 2013-01-08 NOTE — Progress Notes (Signed)
[facility] Progress Note [place] TEAM 1 - Stepdown/ICU TEAM   LAMIJA BESSE HQI:[id number] DOB: 1924/06/01 DOA: 12/29/2012 PCP: Terald Sleeper, MD  Brief narrative: 79 y/o female presented with 2 week hx of feeling poorly - at first she was diagnosed with UTI and was treated with IM antibiotics. For 1 week she developed diarrhea with frequent episodes. She had not been eating anything for 5 days. Her mental status declined and she had been becoming progressively more confused and ultimately minimally responsive. In ER she was found to be dehydrated with Cr of 5.3 and WBC of 21.7.  Of note she has a hx of esophageal strictures and gets dilatations done about once a year. Her last one was about 1 year ago.   After her admission she was noted to be in septic shock from C.diff. PCCM was consulted on 2/2 and assumed care of the pt until 01/07/2013.  Assessment/Plan:  Septic and hypovolemic shock Due to C diff colitis - stabilizing hemodynamically   C diff colitis Cont IV flagyl and oral vanc - clinically improving -stool output has improved considerably however we'll keep rectal tube one more day until patient is stronger.  Acute kidney injury crt is slowly improving w/ volume expansion and hemodynamic stability - follow trend   Hypernatremia Due to hypovolemia as well as true DH/free water deficit - cont hypotonic IVF and follow   Metabolic acidosis from bicarb loss w/ diarrhea, acute renal failure, and NS resuscitation efforts - resolved   Thrombocytopnia Likely due to acute infection - continues to drop which is concerning - continue to avoid heparin products with plt count <50  Acute encephalopathy Slowly improving - likely due to toxic metabolic enceph in setting of severe infection and acute renal failure Unfortunately do to decrease alertness this a.m., she failed the swallow eval-hopefully she will able to pass it tomorrow and we will resume a diet  Mildly  elevated TSH likely sick euthyroid Recheck TSH as outpt when medically stable   Reported hx of esophageal strictures GI has seen - no plans for endo in setting of active GI infection - askED SLP-unfortunately due to the results of the modified barium swallow we are unable to give her clear liquids - agree that there is no reason to suspect dysphagia/aspiration other than generalized weakness - with improvement in mental status, hopefully we can avoid NG tube   Code Status: Limited Code: pressors OK, but no CPR, no shocks, no mechanical ventilation Family Communication: spoke w/ daughter at bedside Disposition Plan: SDU  Consultants: PCCM GI - Outlaw  Procedures: 2/2 CVL >> 2/4 TTE - poor acustic windows - no useful information obtained  Antibiotics: 2/1 vanc >>  2/1 flagyl >>  DVT prophylaxis: SCDs  HPI/Subjective: The patient appears drowsy-answers questions and follows commands but keeps her eyes mostly closed. She is quite restless with movements of her hands arms and head constantly. She is asking for water.   Objective: Blood pressure 115/81, pulse 87, temperature 97.4 F (36.3 C), temperature source Axillary, resp. rate 20, height 5\' 2"  (1.575 m), weight 52.2 kg (115 lb 1.3 oz), SpO2 100.00%.  Intake/Output Summary (Last 24 hours) at 01/08/13 1535 Last data filed at 01/08/13 1106  Gross per 24 hour  Intake   1420 ml  Output    720 ml  Net    700 ml     Exam: General: No acute respiratory distress - very restless does open her eyes intermittently and as I am  leaving the room she becomes quite alert and holdS her head up but on the most part keeps her eyes closed- Lungs: Clear to auscultation bilaterally without wheezes or crackles Cardiovascular: Regular rate and rhythm without murmur gallop or rub normal S1 and S2 Abdomen: thin, nondistended, soft, bowel sounds positive, no rebound, no ascites, no appreciable mass Extremities: No significant cyanosis, clubbing, or  edema bilateral lower extremities  Data Reviewed: Basic Metabolic Panel:  Lab 01/08/13 4098 01/07/13 1224 01/07/13 0430 01/06/13 2046 01/06/13 1246 01/04/13 1715 01/04/13 0836  NA 150* 153* 153* 150* 153* -- --  K 3.9 4.1 4.4 3.3* 3.6 -- --  CL 119* 120* 121* 118* 119* -- --  CO2 18* 20 21 21 22  -- --  GLUCOSE 82 85 85 84 130* -- --  BUN 75* 79* 80* 75* 80* -- --  CREATININE 1.96* 2.32* 2.46* 2.49* 2.90* -- --  CALCIUM 7.6* 7.8* 7.9* 7.4* 8.1* -- --  MG -- -- -- -- -- 2.4 2.5  PHOS -- -- -- -- -- -- 6.7*   Liver Function Tests:  Lab 01/08/13 0500 01/04/13 1715 01/03/13 0900  AST 55* 38* 35  ALT 24 15 13   ALKPHOS 68 128* 123*  BILITOT 0.1* 0.1* 0.2*  PROT 4.1* 4.5* 5.9*  ALBUMIN 1.4* 1.3* 1.8*   CBC:  Lab 01/08/13 0500 01/07/13 0430 01/06/13 0446 01/05/13 0338 01/04/13 1715 12/20/2012 2212  WBC 7.5 8.7 12.5* 18.8* 21.4* --  NEUTROABS -- 7.0 -- -- -- 19.5*  HGB 10.1* 10.6* 10.6* 10.5* 10.9* --  HCT 30.2* 30.8* 30.9* 31.5* 32.1* --  MCV 89.3 87.7 87.3 90.3 89.4 --  PLT 28* 48* 79* 141* 181 --   Cardiac Enzymes:  Lab 01/04/13 1445  CKTOTAL --  CKMB --  CKMBINDEX --  TROPONINI <0.30   CBG:  Lab 01/03/13 0803  GLUCAP 135*    Recent Results (from the past 240 hour(s))  MRSA PCR SCREENING     Status: Normal   Collection Time   01/03/13  3:36 AM      Component Value Range Status Comment   MRSA by PCR NEGATIVE  NEGATIVE Final   CLOSTRIDIUM DIFFICILE BY PCR     Status: Abnormal   Collection Time   01/03/13 10:37 AM      Component Value Range Status Comment   C difficile by pcr POSITIVE (*) NEGATIVE Final   STOOL CULTURE     Status: Normal   Collection Time   01/03/13 10:38 AM      Component Value Range Status Comment   Specimen Description STOOL   Final    Special Requests NONE   Final    Culture     Final    Value: NO SALMONELLA, SHIGELLA, CAMPYLOBACTER, YERSINIA, OR E.COLI 0157:H7 ISOLATED   Report Status 01/07/2013 FINAL   Final   CULTURE, BLOOD (ROUTINE X 2)      Status: Normal (Preliminary result)   Collection Time   01/04/13  1:50 PM      Component Value Range Status Comment   Specimen Description BLOOD RIGHT ARM   Final    Special Requests BOTTLES DRAWN AEROBIC AND ANAEROBIC 10CC   Final    Culture  Setup Time 01/05/2013 15:28   Final    Culture     Final    Value:        BLOOD CULTURE RECEIVED NO GROWTH TO DATE CULTURE WILL BE HELD FOR 5 DAYS BEFORE ISSUING A FINAL NEGATIVE REPORT  Report Status PENDING   Incomplete   CULTURE, BLOOD (ROUTINE X 2)     Status: Normal (Preliminary result)   Collection Time   01/04/13  2:00 PM      Component Value Range Status Comment   Specimen Description BLOOD RIGHT ARM   Final    Special Requests BOTTLES DRAWN AEROBIC ONLY 10CC   Final    Culture  Setup Time 01/05/2013 15:27   Final    Culture     Final    Value:        BLOOD CULTURE RECEIVED NO GROWTH TO DATE CULTURE WILL BE HELD FOR 5 DAYS BEFORE ISSUING A FINAL NEGATIVE REPORT   Report Status PENDING   Incomplete      Studies:  Recent x-ray studies have been reviewed in detail by the Attending Physician  Scheduled Meds:  Reviewed in detail by the Attending Physician   Calvert Cantor, MD [phone] If 7PM-7AM, please contact night-coverage [email or web address] Password [email or web address] 01/08/2013, 3:35 PM   LOS: 6 days

## 2013-01-09 LAB — CBC
HCT: 27.8 % — ABNORMAL LOW (ref 36.0–46.0)
Hemoglobin: 9.6 g/dL — ABNORMAL LOW (ref 12.0–15.0)
MCH: 30.8 pg (ref 26.0–34.0)
MCHC: 34.5 g/dL (ref 30.0–36.0)
RBC: 3.12 MIL/uL — ABNORMAL LOW (ref 3.87–5.11)

## 2013-01-09 LAB — BASIC METABOLIC PANEL
BUN: 70 mg/dL — ABNORMAL HIGH (ref 6–23)
Calcium: 7.6 mg/dL — ABNORMAL LOW (ref 8.4–10.5)
Chloride: 114 mEq/L — ABNORMAL HIGH (ref 96–112)
Creatinine, Ser: 1.74 mg/dL — ABNORMAL HIGH (ref 0.50–1.10)
GFR calc Af Amer: 29 mL/min — ABNORMAL LOW (ref >90)
GFR calc non Af Amer: 25 mL/min — ABNORMAL LOW (ref >90)
Glucose, Bld: 161 mg/dL — ABNORMAL HIGH (ref 70–99)
Potassium: 3.4 mEq/L — ABNORMAL LOW (ref 3.5–5.1)
Sodium: 144 mEq/L (ref 135–145)

## 2013-01-09 MED ORDER — POTASSIUM CHLORIDE 20 MEQ/15ML (10%) PO LIQD
40.0000 meq | Freq: Once | ORAL | Status: DC
  Filled 2013-01-09: qty 30

## 2013-01-09 MED ORDER — LEVOTHYROXINE SODIUM 100 MCG IV SOLR
37.5000 ug | Freq: Every day | INTRAVENOUS | Status: DC
  Administered 2013-01-09 – 2013-01-21 (×13): 38 ug via INTRAVENOUS
  Filled 2013-01-09 (×14): qty 1.9

## 2013-01-09 MED ORDER — SODIUM CHLORIDE 0.9 % IJ SOLN
INTRAMUSCULAR | Status: AC
  Administered 2013-01-09: 10 mL
  Filled 2013-01-09: qty 10

## 2013-01-09 MED ORDER — POTASSIUM CHLORIDE 10 MEQ/50ML IV SOLN
INTRAVENOUS | Status: AC
  Filled 2013-01-09: qty 150

## 2013-01-09 MED ORDER — SODIUM CHLORIDE 0.9 % IV BOLUS (SEPSIS)
1000.0000 mL | Freq: Once | INTRAVENOUS | Status: AC
  Administered 2013-01-09: 1000 mL via INTRAVENOUS

## 2013-01-09 MED ORDER — VANCOMYCIN HCL 500 MG IV SOLR
500.0000 mg | Freq: Four times a day (QID) | Status: DC
  Administered 2013-01-09 – 2013-01-14 (×15): 500 mg via RECTAL
  Filled 2013-01-09 (×23): qty 500

## 2013-01-09 MED ORDER — LEVOTHYROXINE SODIUM 100 MCG IV SOLR
25.0000 ug | Freq: Every day | INTRAVENOUS | Status: DC
  Filled 2013-01-09: qty 1.3

## 2013-01-09 MED ORDER — SODIUM CHLORIDE 0.9 % IJ SOLN
INTRAMUSCULAR | Status: AC
  Administered 2013-01-09: 30 mL
  Filled 2013-01-09: qty 30

## 2013-01-09 MED ORDER — DEXTROSE-NACL 5-0.45 % IV SOLN
INTRAVENOUS | Status: DC
  Administered 2013-01-09 – 2013-01-10 (×2): via INTRAVENOUS

## 2013-01-09 MED ORDER — SODIUM CHLORIDE 0.9 % IV BOLUS (SEPSIS)
500.0000 mL | Freq: Once | INTRAVENOUS | Status: AC
  Administered 2013-01-09: 500 mL via INTRAVENOUS

## 2013-01-09 MED ORDER — SODIUM CHLORIDE 0.9 % IJ SOLN
10.0000 mL | INTRAMUSCULAR | Status: DC | PRN
  Administered 2013-01-09 – 2013-01-10 (×5): 10 mL
  Administered 2013-01-12: 40 mL
  Administered 2013-01-13 – 2013-01-15 (×3): 10 mL

## 2013-01-09 MED ORDER — SODIUM CHLORIDE 0.9 % IV BOLUS (SEPSIS): 500.0000 mL | Freq: Once | INTRAVENOUS | Status: DC

## 2013-01-09 MED ORDER — SODIUM CHLORIDE 0.9 % IJ SOLN
INTRAMUSCULAR | Status: AC
  Filled 2013-01-09: qty 10

## 2013-01-09 MED ORDER — POTASSIUM CHLORIDE 10 MEQ/50ML IV SOLN
10.0000 meq | INTRAVENOUS | Status: AC
  Administered 2013-01-09 (×3): 10 meq via INTRAVENOUS

## 2013-01-09 NOTE — Progress Notes (Signed)
Speech Language Pathology Dysphagia Treatment Patient Details Name: Shirley Bond MRN: [id number] DOB: 01-Sep-1924 Today's Date: 01/09/2013 Time: 1010-1046 SLP Time Calculation (min): 36 min  Assessment / Plan / Recommendation Clinical Impression  Pt with no change in mentation this am. continues to be restless but not fully alert, overt signs of aspiration persist with minimal PO trials. Oral care performed with minimal bleeding, pt appears to have some pain in her mouth. Discussed assessment with pts daughter and need for improved mentation in order to initiate diet. SLp will continue to follow closely for readiness given pts poor toelrance of NG tube and limited options for alternate nutrition. Encouraged pts daughter to discuss options with MD. Did inform daughter of multiple risk factors associated with long term alternate nutrition.     Diet Recommendation  Continue with Current Diet: NPO    SLP Plan Continue with current plan of care   Pertinent Vitals/Pain NA   Swallowing Goals  SLP Swallowing Goals Goal #3: Pt will demonstrate awareness of and sustain attention to puree trials for 1 minute with min verbal cues. Swallow Study Goal #3 - Progress: Not met  General Temperature Spikes Noted: No Respiratory Status: Room air Behavior/Cognition: Lethargic;Doesn't follow directions;Distractible;Confused Oral Cavity - Dentition: Dentures, bottom (partial, removed and cleaned) Patient Positioning: Upright in bed  Oral Cavity - Oral Hygiene Does patient have any of the following "at risk" factors?: Nutritional status - inadequate Patient is HIGH RISK - Oral Care Protocol followed (see row info): Yes   Dysphagia Treatment Treatment focused on: Upgraded PO texture trials;Patient/family/caregiver education;Facilitation of oral preparatory phase;Facilitation of pharyngeal phase Family/Caregiver Educated: daughter Treatment Methods/Modalities: Skilled observation Patient observed  directly with PO's: Yes Type of PO's observed: Ice chips Feeding: Total assist Liquids provided via: Teaspoon Oral Phase Signs & Symptoms: Prolonged oral phase Pharyngeal Phase Signs & Symptoms: Suspected delayed swallow initiation;Immediate cough;Wet vocal quality Type of cueing: Verbal Amount of cueing: Maximal   GO    Harlon Ditty, MA CCC-SLP [phone]  [name] 01/09/2013, 10:54 AM

## 2013-01-09 NOTE — Clinical Documentation Improvement (Signed)
MALNUTRITION DOCUMENTATION CLARIFICATION  THIS DOCUMENT IS NOT A PERMANENT PART OF THE MEDICAL RECORD  TO RESPOND TO THE THIS QUERY, FOLLOW THE INSTRUCTIONS BELOW:  1. If needed, update documentation for the patient's encounter via the notes activity.  2. Access this query again and click edit on the In [employer].  3. After updating, or not, click F2 to complete all highlighted (required) fields concerning your review. Select "additional documentation in the medical record" OR "no additional documentation provided".  4. Click Sign note button.  5. The deficiency will fall out of your In Basket *Please let us know if you are not able to complete this workflow by phone or e-mail (listed below).  Please update your documentation within the medical record to reflect your response to this query.                                                                                        01/09/13   Dear Dr.J. [facility],  In a better effort to capture your patient's severity of illness, reflect appropriate length of stay and utilization of resources, a review of the patient medical record has revealed the following indicators.    Based on your clinical judgment, please clarify and document in a progress note and/or discharge summary the clinical condition associated with the following supporting information:  In responding to this query please exercise your independent judgment.  The fact that a query is asked, does not imply that any particular answer is desired or expected.  Please review nutritionist note from 2/5  patient meeting criteria for "Severe malnutrition in the context of acute illness or injury "  if this is an appropriate secondary diagnosis, please document in notes.  Thank you   Possible Clinical Conditions?   Severe Malnutrition    Protein Calorie Malnutrition  Severe Protein Calorie Malnutrition  Other Condition  Cannot clinically determine        You  may use possible, probable, or suspect with inpatient documentation. possible, probable, suspected diagnoses MUST be documented at the time of discharge  Reviewed: additional documentation in the medical record  Thank You,  Lavonda Jumbo  Clinical Documentation Specialist, BSN: Pager  [phone]  Health Information Management [place]

## 2013-01-09 NOTE — Progress Notes (Signed)
Clinical Child psychotherapist (CSW) to continue following and will facilitate with dc plans.  Theresia Bough, MSW, Theresia Majors [phone]

## 2013-01-09 NOTE — Progress Notes (Signed)
Patient transferred to 3W room 28.  Report called to 3W and all questions answered. Patient transferred via bed with RN. Family aware of new room assignment.

## 2013-01-09 NOTE — Progress Notes (Addendum)
[facility] Progress Note [place] TEAM 1 - Stepdown/ICU TEAM   NIMA BAMBURG ZOX:[id number] DOB: [birth year] DOA: 01/01/2013 PCP: Terald Sleeper, MD  Brief narrative: 79 y/o female presented with 2 week hx of feeling poorly - at first she was diagnosed with UTI and was treated with IM antibiotics. For 1 week she developed diarrhea with frequent episodes. She had not been eating anything for 5 days. Her mental status declined and she had been becoming progressively more confused and ultimately minimally responsive. In ER she was found to be dehydrated with Cr of 5.3 and WBC of 21.7.  Of note she has a hx of esophageal strictures and gets dilatations done about once a year. Her last one was about 1 year ago.   After her admission she was noted to be in septic shock from C.diff. PCCM was consulted on 2/2 and assumed care of the pt until 01/07/2013.  Assessment/Plan:  Septic and hypovolemic shock Due to C diff colitis - stabilizing hemodynamically - continuing to volume resuscitate due to lack of oral intake   C diff colitis Cont IV flagyl - change vanc to enema due to inability to tolerate oral intake - clinically improving - stool output has improved considerably - consider removing rectal tube in next 24hrs  Acute kidney injury crt is slowly improving w/ volume expansion and hemodynamic stability - follow trend w/ ongoing hydration  Hypernatremia Due to hypovolemia as well as true DH/free water deficit - cont hypotonic IVF and follow   Metabolic acidosis from bicarb loss w/ diarrhea, acute renal failure, and NS resuscitation efforts - bicarb beginning to decline again - if falls further by AM, will consider resuming bicarb via IV    Thrombocytopnia Likely due to acute infection - continues to drop which is concerning - continue to avoid heparin products with plt count <50 - no spontaneous blood loss at this time   Acute encephalopathy Slowly improving - likely due to  toxic metabolic enceph in setting of severe infection and acute renal failure  Mildly elevated TSH  May not be reflective of inusfficient replacement in setting of acute infection, but given altered mental status I have chosen to increase her synthroid dose - she will need a repeat TSH in 6-8 weeks  Reported hx of esophageal strictures GI has seen - no plans for endo in setting of active GI infection - unfortunately due to the results of the modified barium swallow we are unable to give her clear liquids - agree that there is no reason to suspect dysphagia/aspiration other than generalized weakness - with improvement in mental status, hopefully we can avoid NG tube - given severity of colonic infection, I have chosen to avoid tube feeds at this time (concerned they would only worsen diarrhea) - I will begin a SHORT COURSE of TNA until diarrhea has improved and decision can be made on need for NG/PEG tube or not  Severe malnutrition in the context of acute illness  Code Status: Limited Code: pressors OK, but no CPR, no shocks, no mechanical ventilation Family Communication: spoke w/ daughter at bedside Disposition Plan: stable for tele bed at this time  Consultants: PCCM GI - Outlaw  Procedures: 2/2 CVL L IJ >> 2/4 TTE - poor acustic windows - no useful information obtained  Antibiotics: 2/1 vanc >>  2/1 flagyl >>  DVT prophylaxis: SCDs  HPI/Subjective: The patient is drowsy and unable to provide a hx at this time.  I have spoken at length with  her daughter at the bedside.   Objective: Blood pressure 103/60, pulse 82, temperature 97.8 F (36.6 C), temperature source Axillary, resp. rate 20, height 5\' 2"  (1.575 m), weight 52.2 kg (115 lb 1.3 oz), SpO2 100.00%.  Intake/Output Summary (Last 24 hours) at 01/09/13 1504 Last data filed at 01/09/13 1200  Gross per 24 hour  Intake   3130 ml  Output    500 ml  Net   2630 ml     Exam: General: No acute respiratory distress   Lungs: Clear to auscultation bilaterally without wheezes or crackles Cardiovascular: Regular rate and rhythm without murmur gallop or rub  Abdomen: thin, mildly distended but soft, bowel sounds positive, no rebound, no ascites, no appreciable mass Extremities: No significant cyanosis, clubbing, or edema bilateral lower extremities  Data Reviewed: Basic Metabolic Panel:  Lab 01/09/13 1610 01/08/13 0500 01/07/13 1224 01/07/13 0430 01/06/13 2046 01/04/13 1715 01/04/13 0836  NA 144 150* 153* 153* 150* -- --  K 3.4* 3.9 4.1 4.4 3.3* -- --  CL 114* 119* 120* 121* 118* -- --  CO2 18* 18* 20 21 21  -- --  GLUCOSE 161* 82 85 85 84 -- --  BUN 70* 75* 79* 80* 75* -- --  CREATININE 1.74* 1.96* 2.32* 2.46* 2.49* -- --  CALCIUM 7.6* 7.6* 7.8* 7.9* 7.4* -- --  MG -- -- -- -- -- 2.4 2.5  PHOS -- -- -- -- -- -- 6.7*   Liver Function Tests:  Lab 01/08/13 0500 01/04/13 1715 01/03/13 0900  AST 55* 38* 35  ALT 24 15 13   ALKPHOS 68 128* 123*  BILITOT 0.1* 0.1* 0.2*  PROT 4.1* 4.5* 5.9*  ALBUMIN 1.4* 1.3* 1.8*   CBC:  Lab 01/09/13 0525 01/08/13 0500 01/07/13 0430 01/06/13 0446 01/05/13 0338 12/19/2012 2212  WBC 6.8 7.5 8.7 12.5* 18.8* --  NEUTROABS -- -- 7.0 -- -- 19.5*  HGB 9.6* 10.1* 10.6* 10.6* 10.5* --  HCT 27.8* 30.2* 30.8* 30.9* 31.5* --  MCV 89.1 89.3 87.7 87.3 90.3 --  PLT 25* 28* 48* 79* 141* --   Cardiac Enzymes:  Lab 01/04/13 1445  CKTOTAL --  CKMB --  CKMBINDEX --  TROPONINI <0.30   CBG:  Lab 01/03/13 0803  GLUCAP 135*    Recent Results (from the past 240 hour(s))  MRSA PCR SCREENING     Status: Normal   Collection Time   01/03/13  3:36 AM      Component Value Range Status Comment   MRSA by PCR NEGATIVE  NEGATIVE Final   CLOSTRIDIUM DIFFICILE BY PCR     Status: Abnormal   Collection Time   01/03/13 10:37 AM      Component Value Range Status Comment   C difficile by pcr POSITIVE (*) NEGATIVE Final   STOOL CULTURE     Status: Normal   Collection Time   01/03/13 10:38 AM       Component Value Range Status Comment   Specimen Description STOOL   Final    Special Requests NONE   Final    Culture     Final    Value: NO SALMONELLA, SHIGELLA, CAMPYLOBACTER, YERSINIA, OR E.COLI 0157:H7 ISOLATED   Report Status 01/07/2013 FINAL   Final   CULTURE, BLOOD (ROUTINE X 2)     Status: Normal (Preliminary result)   Collection Time   01/04/13  1:50 PM      Component Value Range Status Comment   Specimen Description BLOOD RIGHT ARM   Final  Special Requests BOTTLES DRAWN AEROBIC AND ANAEROBIC 10CC   Final    Culture  Setup Time 01/05/2013 15:28   Final    Culture     Final    Value:        BLOOD CULTURE RECEIVED NO GROWTH TO DATE CULTURE WILL BE HELD FOR 5 DAYS BEFORE ISSUING A FINAL NEGATIVE REPORT   Report Status PENDING   Incomplete   CULTURE, BLOOD (ROUTINE X 2)     Status: Normal (Preliminary result)   Collection Time   01/04/13  2:00 PM      Component Value Range Status Comment   Specimen Description BLOOD RIGHT ARM   Final    Special Requests BOTTLES DRAWN AEROBIC ONLY 10CC   Final    Culture  Setup Time 01/05/2013 15:27   Final    Culture     Final    Value:        BLOOD CULTURE RECEIVED NO GROWTH TO DATE CULTURE WILL BE HELD FOR 5 DAYS BEFORE ISSUING A FINAL NEGATIVE REPORT   Report Status PENDING   Incomplete      Studies:  Recent x-ray studies have been reviewed in detail by the Attending Physician  Scheduled Meds:  Reviewed in detail by the Attending Physician   Lonia Blood, MD [facility] Office  [phone] Pager [phone]  On-Call/Text Page:      [email or web address]      password [email or web address]  If 7PM-7AM, please contact night-coverage [email or web address] Password [email or web address] 01/09/2013, 3:04 PM   LOS: 7 days

## 2013-01-10 ENCOUNTER — Inpatient Hospital Stay (HOSPITAL_COMMUNITY): Payer: Medicare Other

## 2013-01-10 DIAGNOSIS — A414 Sepsis due to anaerobes: Secondary | ICD-10-CM | POA: Diagnosis not present

## 2013-01-10 DIAGNOSIS — R4182 Altered mental status, unspecified: Secondary | ICD-10-CM | POA: Diagnosis not present

## 2013-01-10 LAB — COMPREHENSIVE METABOLIC PANEL
ALT: 22 U/L (ref 0–35)
AST: 36 U/L (ref 0–37)
Calcium: 7.3 mg/dL — ABNORMAL LOW (ref 8.4–10.5)
Creatinine, Ser: 1.46 mg/dL — ABNORMAL HIGH (ref 0.50–1.10)
GFR calc Af Amer: 36 mL/min — ABNORMAL LOW (ref >90)
Sodium: 140 mEq/L (ref 135–145)
Total Protein: 3.9 g/dL — ABNORMAL LOW (ref 6.0–8.3)

## 2013-01-10 LAB — BLOOD GAS, ARTERIAL
Bicarbonate: 15.2 mEq/L — ABNORMAL LOW (ref 20.0–24.0)
Drawn by: 252031
O2 Saturation: 97.1 %
pCO2 arterial: 23.1 mmHg — ABNORMAL LOW (ref 35.0–45.0)
pO2, Arterial: 91.7 mmHg (ref 80.0–100.0)

## 2013-01-10 LAB — MAGNESIUM: Magnesium: 1.7 mg/dL (ref 1.5–2.5)

## 2013-01-10 LAB — CBC
HCT: 28.4 % — ABNORMAL LOW (ref 36.0–46.0)
Hemoglobin: 9.7 g/dL — ABNORMAL LOW (ref 12.0–15.0)
MCHC: 34.2 g/dL (ref 30.0–36.0)
RBC: 3.17 MIL/uL — ABNORMAL LOW (ref 3.87–5.11)

## 2013-01-10 LAB — PREALBUMIN: Prealbumin: 9.1 mg/dL — ABNORMAL LOW (ref 17.0–34.0)

## 2013-01-10 MED ORDER — DEXTROSE 5 % IV SOLN
INTRAVENOUS | Status: DC
  Administered 2013-01-10 – 2013-01-11 (×3): via INTRAVENOUS
  Filled 2013-01-10 (×9): qty 100

## 2013-01-10 MED ORDER — BIOTENE DRY MOUTH MT LIQD
15.0000 mL | Freq: Two times a day (BID) | OROMUCOSAL | Status: DC
  Administered 2013-01-10 – 2013-01-21 (×18): 15 mL via OROMUCOSAL

## 2013-01-10 MED ORDER — ACETAMINOPHEN 10 MG/ML IV SOLN
1000.0000 mg | Freq: Once | INTRAVENOUS | Status: AC
  Administered 2013-01-10: 1000 mg via INTRAVENOUS
  Filled 2013-01-10: qty 100

## 2013-01-10 MED ORDER — ACETAMINOPHEN 10 MG/ML IV SOLN: 1000.0000 mg | Freq: Once | INTRAVENOUS | Status: AC

## 2013-01-10 NOTE — Progress Notes (Signed)
Pt noted to have decreased urine OP with total OP 150cc/10 hours.  Urine Dark amber in color and cloudy.  Pt noted to have round taut abdomen with generalized edema.  MD notified.  Will continue to monitor at this time.

## 2013-01-10 NOTE — Progress Notes (Signed)
ANTIBIOTIC CONSULT NOTE - Follow Up  Pharmacy Consult for Flagyl Indication: Sepsis from C-diff  Patient Measurements: Height: 5\' 2"  (157.5 cm) (per daughter) Weight: 115 lb 1.3 oz (52.2 kg) IBW/kg (Calculated) : 50.1  Vital Signs: Temp: 98.7 F (37.1 C) (02/08 0500) BP: 102/47 mmHg (02/08 0500) Pulse Rate: 74 (02/08 0500) Intake/Output from previous day: 02/07 0701 - 02/08 0700 In: 4210 [I.V.:2360; IV Piggyback:1850] Out: 450 [Urine:250; Stool:200] Intake/Output from this shift:    Labs:  Recent Labs  01/08/13 0500 01/09/13 0525 01/10/13 0520  WBC 7.5 6.8 7.9  HGB 10.1* 9.6* 9.7*  PLT 28* 25* 36*  CREATININE 1.96* 1.74* 1.46*   Estimated Creatinine Clearance: 21.9 ml/min (by C-G formula based on Cr of 1.46). No results found for this basename: VANCOTROUGH, Leodis Binet, VANCORANDOM, GENTTROUGH, GENTPEAK, GENTRANDOM, TOBRATROUGH, TOBRAPEAK, TOBRARND, AMIKACINPEAK, AMIKACINTROU, AMIKACIN,  in the last 72 hours   Microbiology: Recent Results (from the past 720 hour(s))  MRSA PCR SCREENING     Status: None   Collection Time    01/03/13  3:36 AM      Result Value Range Status   MRSA by PCR NEGATIVE  NEGATIVE Final   Comment:            The GeneXpert MRSA Assay (FDA     approved for NASAL specimens     only), is one component of a     comprehensive MRSA colonization     surveillance program. It is not     intended to diagnose MRSA     infection nor to guide or     monitor treatment for     MRSA infections.  CLOSTRIDIUM DIFFICILE BY PCR     Status: Abnormal   Collection Time    01/03/13 10:37 AM      Result Value Range Status   C difficile by pcr POSITIVE (*) NEGATIVE Final   Comment: CRITICAL RESULT CALLED TO, READ BACK BY AND VERIFIED WITH:     J.DAVIS,RN 1610 01/03/13 EHOWARD  STOOL CULTURE     Status: None   Collection Time    01/03/13 10:38 AM      Result Value Range Status   Specimen Description STOOL   Final   Special Requests NONE   Final   Culture      Final   Value: NO SALMONELLA, SHIGELLA, CAMPYLOBACTER, YERSINIA, OR E.COLI 0157:H7 ISOLATED   Report Status 01/07/2013 FINAL   Final  CULTURE, BLOOD (ROUTINE X 2)     Status: None   Collection Time    01/04/13  1:50 PM      Result Value Range Status   Specimen Description BLOOD RIGHT ARM   Final   Special Requests BOTTLES DRAWN AEROBIC AND ANAEROBIC 10CC   Final   Culture  Setup Time 01/05/2013 15:28   Final   Culture     Final   Value:        BLOOD CULTURE RECEIVED NO GROWTH TO DATE CULTURE WILL BE HELD FOR 5 DAYS BEFORE ISSUING A FINAL NEGATIVE REPORT   Report Status PENDING   Incomplete  CULTURE, BLOOD (ROUTINE X 2)     Status: None   Collection Time    01/04/13  2:00 PM      Result Value Range Status   Specimen Description BLOOD RIGHT ARM   Final   Special Requests BOTTLES DRAWN AEROBIC ONLY 10CC   Final   Culture  Setup Time 01/05/2013 15:27   Final   Culture  Final   Value:        BLOOD CULTURE RECEIVED NO GROWTH TO DATE CULTURE WILL BE HELD FOR 5 DAYS BEFORE ISSUING A FINAL NEGATIVE REPORT   Report Status PENDING   Incomplete    Assessment: 79 yo F w recent hx of antibiotic use and diarrhea for the past 5 days, C-diff PCR positive,  leukocytosis resolving.  Blood cultures are negative thus far.  She continues on IV Flagyl, Vancomycin enema.  Pt. Is in ARF, with crcl ~ 65ml/min  Plan:  Continue Flagyl 500mg  IV Q 8 hrs Continue Vancomycin Enema 500mg  PR 4 times daily  Pharmacy will sign off as no further dosage changes anticipated  Celedonio Miyamoto, PharmD, BCPS Clinical Pharmacist Pager [phone]  Thank you for allowing pharmacy to be part of this patients care team. 01/10/2013,9:52 AM

## 2013-01-10 NOTE — Progress Notes (Signed)
Irrigated foley cath. With 60 ml sterile saline.  60 ml returns.  Pt tol well, but continues to complain of thirst and abdominal discomfort.  Flexiseal continues to drain watery green/brown stool.  MD notified.  Abdomen continues to be distended with noticeable fluid wave / tautness.

## 2013-01-10 NOTE — Progress Notes (Signed)
Pt. Noted to be restless and agitated.  Pt states "I can't breathe." but unable to verbalize additional complaints.  Unable to obtain O2 sat via dinamap or continuous pulse ox machine with attempts to obtain pulse ox from ear, finger and forehead probe with no success.  Pt. With worsening breath sounds bilaterally developing worsening rhonchi.  BP 100/42 HR 82 RR 24.  MD notified.  Orders received for stat ABG and PCXR.  Will continue to monitor at this time.

## 2013-01-10 NOTE — Progress Notes (Signed)
[facility] Progress Note [place] TEAM 1 - Stepdown/ICU TEAM   BERKLEIGH BECKLES AVW:[id number] DOB: [birth year] DOA: 12/18/2012 PCP: Terald Sleeper, MD  Brief narrative: 79 y/o female presented with 2 week hx of feeling poorly - at first she was diagnosed with UTI and was treated with IM antibiotics. For 1 week she developed diarrhea with frequent episodes. She had not been eating anything for 5 days. Her mental status declined and she had been becoming progressively more confused and ultimately minimally responsive. In ER she was found to be dehydrated with Cr of 5.3 and WBC of 21.7.  Of note she has a hx of esophageal strictures and gets dilatations done about once a year. Her last one was about 1 year ago.   After her admission she was noted to be in septic shock from C.diff. PCCM was consulted on 2/2 and assumed care of the pt until 01/07/2013.  Assessment/Plan:  Septic and hypovolemic shock Due to C diff colitis - stabilizing hemodynamically - continuing to volume resuscitate due to lack of oral intake   C diff colitis Cont IV flagyl - change vanc to enema due to inability to tolerate oral intake -continue with  rectal tube.   Acute kidney injury crt is slowly improving w/ volume expansion and hemodynamic stability - follow trend w/ ongoing hydration. Decrease urine out put. No evidence of bladder obstruction. Continue with IV fluids.   Hypernatremia Due to hypovolemia as well as true DH/free water deficit - Continue with IV fluids.  Metabolic acidosis from bicarb loss w/ diarrhea, acute renal failure, and NS resuscitation efforts - bicarb beginning to decline again - if falls further by AM, will  Resume  bicarb via IV    Thrombocytopnia Likely due to acute infection - continues to drop which is concerning - continue to avoid heparin products with plt count <50 - no spontaneous blood loss at this time   Acute encephalopathy Slowly improving - likely due to toxic  metabolic enceph in setting of severe infection and acute renal failure  Mildly elevated TSH  May not be reflective of inusfficient replacement in setting of acute infection, but given altered mental status I have chosen to increase her synthroid dose - she will need a repeat TSH in 6-8 weeks  Reported hx of esophageal strictures GI has seen - no plans for endo in setting of active GI infection - unfortunately due to the results of the modified barium swallow we are unable to give her clear liquids.[name] Spoke with Radiologist, Dr Ramon Dredge he was recommending PEG. Patient at high risk for aspiration. I don't know how reversible is her risk for aspiration.  I will ask Dr Madilyn Fireman to evaluate and help [place] define best course for nutrition.  Ascites: Will ask Dr Madilyn Fireman for evaluation.  Decrease urine Out put: Continue with IV fluids. Monitor for volume overload.   Code Status: Limited Code: pressors OK, but no CPR, no shocks, no mechanical ventilation Family Communication: Daughter at bedside, plan of care discussed with her. She agree to speak with palliative, but she wants to continue with care for now.  She agree with NG tube placement.  Disposition Plan:   Consultants: PCCM GI - Outlaw  Procedures: 2/2 CVL L IJ >> 2/4 TTE - poor acustic windows - no useful information obtained  Antibiotics: 2/1 vanc >>  2/1 flagyl >>  DVT prophylaxis: SCDs  HPI/Subjective: Patient is awake. Complaining of pain, generalized. .   Objective: Blood pressure 102/47, pulse 74,  temperature 98.7 F (37.1 C), temperature source Axillary, resp. rate 12, height 5\' 2"  (1.575 m), weight 52.2 kg (115 lb 1.3 oz), SpO2 100.00%.  Intake/Output Summary (Last 24 hours) at 01/10/13 1101 Last data filed at 01/10/13 0700  Gross per 24 hour  Intake   2900 ml  Output    400 ml  Net   2500 ml     Exam: General: patient is in pain. Lungs: Bilateral ronchus, crackles Cardiovascular: Regular rate and rhythm without murmur  gallop or rub  Abdomen:  mildly distended,  bowel sounds positive, no rebound, no ascites, no appreciable mass Extremities: No significant cyanosis, clubbing, or edema bilateral lower extremities  Data Reviewed: Basic Metabolic Panel:  Recent Labs Lab 01/04/13 0836 01/04/13 1715  01/07/13 0430 01/07/13 1224 01/08/13 0500 01/09/13 0525 01/10/13 0520  NA  --  151*  < > 153* 153* 150* 144 140  K  --  4.6  < > 4.4 4.1 3.9 3.4* 3.6  CL  --  123*  < > 121* 120* 119* 114* 113*  CO2  --  13*  < > 21 20 18* 18* 17*  GLUCOSE  --  90  < > 85 85 82 161* 100*  BUN  --  96*  < > 80* 79* 75* 70* 61*  CREATININE  --  4.65*  < > 2.46* 2.32* 1.96* 1.74* 1.46*  CALCIUM  --  7.1*  < > 7.9* 7.8* 7.6* 7.6* 7.3*  MG 2.5 2.4  --   --   --   --   --  1.7  PHOS 6.7*  --   --   --   --   --   --  3.7  < > = values in this interval not displayed. Liver Function Tests:  Recent Labs Lab 01/04/13 1715 01/08/13 0500 01/10/13 0520  AST 38* 55* 36  ALT 15 24 22   ALKPHOS 128* 68 62  BILITOT 0.1* 0.1* 0.1*  PROT 4.5* 4.1* 3.9*  ALBUMIN 1.3* 1.4* 1.3*   CBC:  Recent Labs Lab 01/06/13 0446 01/07/13 0430 01/08/13 0500 01/09/13 0525 01/10/13 0520  WBC 12.5* 8.7 7.5 6.8 7.9  NEUTROABS  --  7.0  --   --   --   HGB 10.6* 10.6* 10.1* 9.6* 9.7*  HCT 30.9* 30.8* 30.2* 27.8* 28.4*  MCV 87.3 87.7 89.3 89.1 89.6  PLT 79* 48* 28* 25* 36*   Cardiac Enzymes:  Recent Labs Lab 01/04/13 1445  TROPONINI <0.30   CBG: No results found for this basename: GLUCAP,  in the last 168 hours  Recent Results (from the past 240 hour(s))  MRSA PCR SCREENING     Status: None   Collection Time    01/03/13  3:36 AM      Result Value Range Status   MRSA by PCR NEGATIVE  NEGATIVE Final   Comment:            The GeneXpert MRSA Assay (FDA     approved for NASAL specimens     only), is one component of a     comprehensive MRSA colonization     surveillance program. It is not     intended to diagnose MRSA      infection nor to guide or     monitor treatment for     MRSA infections.  CLOSTRIDIUM DIFFICILE BY PCR     Status: Abnormal   Collection Time    01/03/13 10:37 AM  Result Value Range Status   C difficile by pcr POSITIVE (*) NEGATIVE Final   Comment: CRITICAL RESULT CALLED TO, READ BACK BY AND VERIFIED WITH:     J.DAVIS,RN 1610 01/03/13 EHOWARD  STOOL CULTURE     Status: None   Collection Time    01/03/13 10:38 AM      Result Value Range Status   Specimen Description STOOL   Final   Special Requests NONE   Final   Culture     Final   Value: NO SALMONELLA, SHIGELLA, CAMPYLOBACTER, YERSINIA, OR E.COLI 0157:H7 ISOLATED   Report Status 01/07/2013 FINAL   Final  CULTURE, BLOOD (ROUTINE X 2)     Status: None   Collection Time    01/04/13  1:50 PM      Result Value Range Status   Specimen Description BLOOD RIGHT ARM   Final   Special Requests BOTTLES DRAWN AEROBIC AND ANAEROBIC 10CC   Final   Culture  Setup Time 01/05/2013 15:28   Final   Culture     Final   Value:        BLOOD CULTURE RECEIVED NO GROWTH TO DATE CULTURE WILL BE HELD FOR 5 DAYS BEFORE ISSUING A FINAL NEGATIVE REPORT   Report Status PENDING   Incomplete  CULTURE, BLOOD (ROUTINE X 2)     Status: None   Collection Time    01/04/13  2:00 PM      Result Value Range Status   Specimen Description BLOOD RIGHT ARM   Final   Special Requests BOTTLES DRAWN AEROBIC ONLY 10CC   Final   Culture  Setup Time 01/05/2013 15:27   Final   Culture     Final   Value:        BLOOD CULTURE RECEIVED NO GROWTH TO DATE CULTURE WILL BE HELD FOR 5 DAYS BEFORE ISSUING A FINAL NEGATIVE REPORT   Report Status PENDING   Incomplete     Studies:  Recent x-ray studies have been reviewed in detail by the Attending Physician  Scheduled Meds:  Reviewed in detail by the Attending Physician  Hartley Barefoot [phone].  On-Call/Text Page:      [email or web address]      password [email or web address]  If 7PM-7AM, please contact night-coverage [email or web address] Password  [email or web address] 01/10/2013, 11:01 AM   LOS: 8 days

## 2013-01-10 NOTE — Progress Notes (Signed)
NUTRITION FOLLOW UP/consult   Intervention:   1. Recommend trial of tube feeding prior to starting TPN, Vital 1.5 initiate at 20 ml/hr and advance by 10 ml q 4 hr to a goal rate of 40 ml/hr. 30 ml Pro-stat once daily. This EN regimen would provide 1540 kcal, 80 gm protein, and 733 ml free water.   2. RD will follow TPN per pharmacy.   3. Recommend daily weights to monitor for nutrition adequacy.   4. RD will continue to follow     Nutrition Dx:   Inadequate oral intake related to decreased alertness and altered GI function as evidenced by NPO status. Ongoing    Goal:   Intake to meet >/=90% estimated nutrition needs. Unmet   Monitor:   TPN vs TF, weight trends, I/O's, labs  Assessment:   RD consulted for new TPN orders. Pt with C. Diff and dysphagia. Pt being followed by SLP, s/p MBS on 2/6, and follow up evaluations recommending NPO for risk of aspiration. Pt has been NPO for about 7 days, did have diet advancement to D1-honey for several hours on 2/2, and clear liquids on 2/5.  MD note indicates avoiding tube feeding to avoid worsening diarrhea. Recommend pt trial NG feeding with semi-elemental formula and assess tolerance before starting TPN.  Height: Ht Readings from Last 1 Encounters:  01/03/13 5\' 2"  (1.575 m)    Weight Status:   Wt Readings from Last 1 Encounters:  01/03/13 115 lb 1.3 oz (52.2 kg)  no new weight since admission   Re-estimated needs:  Kcal: 1350-1550  Protein: 75-85 gm  Fluid: 1.4-1.6 L   Skin: excoriated perineum   Diet Order: NPO   Intake/Output Summary (Last 24 hours) at 01/10/13 0921 Last data filed at 01/10/13 0700  Gross per 24 hour  Intake   3010 ml  Output    400 ml  Net   2610 ml    Last BM: 2/7   Labs:   Recent Labs Lab 01/04/13 0836 01/04/13 1715  01/08/13 0500 01/09/13 0525 01/10/13 0520  NA  --  151*  < > 150* 144 140  K  --  4.6  < > 3.9 3.4* 3.6  CL  --  123*  < > 119* 114* 113*  CO2  --  13*  < > 18* 18* 17*   BUN  --  96*  < > 75* 70* 61*  CREATININE  --  4.65*  < > 1.96* 1.74* 1.46*  CALCIUM  --  7.1*  < > 7.6* 7.6* 7.3*  MG 2.5 2.4  --   --   --  1.7  PHOS 6.7*  --   --   --   --  3.7  GLUCOSE  --  90  < > 82 161* 100*  < > = values in this interval not displayed.  CBG (last 3)  No results found for this basename: GLUCAP,  in the last 72 hours  Scheduled Meds: . acetaminophen  1,000 mg Intravenous Once  . budesonide-formoterol  2 puff Inhalation BID  . Gerhardt's butt cream   Topical TID  . ipratropium  2 spray Each Nare BID  . levothyroxine  38 mcg Intravenous Daily  . metronidazole  500 mg Intravenous Q8H  . olopatadine  1 drop Both Eyes BID  . vancomycin (VANCOCIN) rectal ENEMA  500 mg Rectal Q6H    Continuous Infusions: . dextrose 5 % and 0.45% NaCl 150 mL/hr at 01/09/13 1621  Clarene Duke RD, LDN Pager [phone] After Hours pager [phone]

## 2013-01-10 NOTE — Progress Notes (Signed)
Called and left a message for patient's daughter Sarika Baldini at [phone] to call back on team phone to schedule GOC meeting

## 2013-01-11 DIAGNOSIS — D696 Thrombocytopenia, unspecified: Secondary | ICD-10-CM | POA: Diagnosis not present

## 2013-01-11 LAB — CULTURE, BLOOD (ROUTINE X 2): Culture: NO GROWTH

## 2013-01-11 LAB — GLUCOSE, CAPILLARY
Glucose-Capillary: 117 mg/dL — ABNORMAL HIGH (ref 70–99)
Glucose-Capillary: 155 mg/dL — ABNORMAL HIGH (ref 70–99)

## 2013-01-11 LAB — MAGNESIUM: Magnesium: 1.6 mg/dL (ref 1.5–2.5)

## 2013-01-11 MED ORDER — INSULIN ASPART 100 UNIT/ML ~~LOC~~ SOLN
0.0000 [IU] | SUBCUTANEOUS | Status: DC
  Administered 2013-01-11: 2 [IU] via SUBCUTANEOUS
  Administered 2013-01-12: 1 [IU] via SUBCUTANEOUS
  Administered 2013-01-12: 2 [IU] via SUBCUTANEOUS
  Administered 2013-01-12 – 2013-01-13 (×3): 1 [IU] via SUBCUTANEOUS
  Administered 2013-01-13 (×4): 2 [IU] via SUBCUTANEOUS
  Administered 2013-01-14 (×2): 1 [IU] via SUBCUTANEOUS
  Administered 2013-01-14: 2 [IU] via SUBCUTANEOUS
  Administered 2013-01-14 (×2): 1 [IU] via SUBCUTANEOUS
  Administered 2013-01-14 – 2013-01-15 (×3): 2 [IU] via SUBCUTANEOUS
  Administered 2013-01-16: 1 [IU] via SUBCUTANEOUS
  Administered 2013-01-16 (×3): 2 [IU] via SUBCUTANEOUS
  Administered 2013-01-16 – 2013-01-17 (×4): 1 [IU] via SUBCUTANEOUS

## 2013-01-11 MED ORDER — FUROSEMIDE 10 MG/ML IJ SOLN
40.0000 mg | Freq: Once | INTRAMUSCULAR | Status: AC
  Administered 2013-01-11: 40 mg via INTRAVENOUS
  Filled 2013-01-11: qty 4

## 2013-01-11 MED ORDER — CLINIMIX E/DEXTROSE (5/15) 5 % IV SOLN
INTRAVENOUS | Status: AC
  Administered 2013-01-11: 18:00:00 via INTRAVENOUS
  Filled 2013-01-11: qty 1000

## 2013-01-11 NOTE — Consult Note (Signed)
Patient EA:VWUJ ROBINETTE ESTERS      DOB: 05-26-1924      WJX:[id number]     Consult Note from the Palliative Medicine Team at [facility]    Consult Requested by: Dr Sunnie Nielsen     PCP: Terald Sleeper, MD Reason for Consultation: Goals of care     Phone [phone]  Assessment of patients Current state: Patient Active Problem List  Diagnosis  . Cerebral hemorrhage  . HTN (hypertension)  . Osteoarthritis  . COPD (chronic obstructive pulmonary disease)  . UTI (urinary tract infection), bacterial  . ARF (acute renal failure)  . Dehydration  . Diarrhea  . Hypotension  . Rash of groin  . Hx of scabies  . Encephalopathy acute  . C. difficile colitis  . Severe sepsis with septic shock  . Leukocytosis  . Thrombocytopenia, unspecified    79 year old female who was admitted for C. difficile colitis, dehydration, hypovolemic shock requiring aggressive IV fluid resuscitation. At this time has developed dysphagia with high risk of aspiration. Family would like to try TPN, and workup of the dysphagia. Patient has a history of strictures in the esophagus and had required dilation  in the past. Family is hoping that  esophageal stricture is causing the problem and dilation makes her able to eat again. Would recommend GI evaluation Start TPN If patient fails to improve with above measures family would again like to have palliative care meeting. Discussed the CODE STATUS in detail family at this time wants the patient to be DO NOT RESUSCITATE  Goals of Care: 1.  Code Status: DNR   2. Scope of Treatment: 1. Vital Signs: Per protocol 2. Respiratory/Oxygen: Oxygen via nasal canula 3. Nutritional Support/Tube Feeds: No long term tube feeds, no peg tube 4. Antibiotics: Continue as needed 5. Blood Products: Continue as needed 6. IVF: Continue as needed 7. Review of Medications to be discontinued: none 8. Labs: Continue as needed    4. Disposition: Pending   3. Symptom  Management:    1. Pain: Has chronic back pain, Patient's daughter says that the pain was controlled with ultracet. Will start her back on Ultracet, no other opiates as patient had severe altered mental status with opiates. 2. Bowel Regimen: Patient has diarrhea 3. Fever: Tylenol prn   4. Psychosocial: Patient is residing at [facility]     Patient Documents Completed or Given: Document Given Completed  Advanced Directives Pkt    MOST    DNR    Gone from My Sight    Hard Choices      Brief HPI:  79 year old female with 2 weeks history of poor by mouth intake came to the hospital with diarrhea. Patient was found to be in hypovolemic shock due to C. difficile colitis. Patient was given IV fluids in the ICU. After patient was stabilized she was transferred to floor. At this time patient is found to have dysphagia and high risk of aspiration. She does have a history of esophageal strictures and gets dilations about once a year as outpatient. Her last dilation was one year ago. Patient has poor urine output though her kidney function seems to be improving. Goals of care meeting done with the patient's daughter and son at bedside. Patient is alert, oriented x 2.   ROS: Pain: Chronic pain Insomnia: Denies Nausea: Denies Diarrhea: Positive Dyspnea: Negative   PMH:  Past Medical History  Diagnosis Date  . COPD (chronic obstructive pulmonary disease)   . Frequent UTI   . Hypertension   .  Arthritis   . Osteoarthritis   . Hypercholesteremia   . Asthma   . Thyroid disease   . Environmental allergies   . Osteopenia   . Macular degeneration   . Hearing loss   . Constipation   . Back pain   . Dementia   . PONV (postoperative nausea and vomiting)   . Hypothyroidism   . Blood transfusion     " no reaction to transfusion "  . Seizures      PSH: Past Surgical History  Procedure Laterality Date  . Kyphosis surgery    . Spinal fusion    . Rotator cuff repair      unsuccessful x 2   . Bladder surgery      with mesh & vaginal wall   I have reviewed the FH and SH and  If appropriate update it with new information. Allergies  Allergen Reactions  . Codeine Other (See Comments)    Unknown.  [name] Hyomax (Hyoscyamine Sulfate) Other (See Comments)    unknown  . Other     Negative reactions to narcotics/opiates  . Oxycodone Hcl Er Other (See Comments)    unknown  . Sulfa Antibiotics Other (See Comments)    unknown   Scheduled Meds: . antiseptic oral rinse  15 mL Mouth Rinse BID  . budesonide-formoterol  2 puff Inhalation BID  . furosemide  40 mg Intravenous Once  . Gerhardt's butt cream   Topical TID  . ipratropium  2 spray Each Nare BID  . levothyroxine  38 mcg Intravenous Daily  . metronidazole  500 mg Intravenous Q8H  . olopatadine  1 drop Both Eyes BID  . vancomycin (VANCOCIN) rectal ENEMA  500 mg Rectal Q6H   Continuous Infusions: .  sodium bicarbonate  infusion 1000 mL 125 mL/hr at 01/10/13 1557   PRN Meds:.acetaminophen, albuterol, ondansetron (ZOFRAN) IV, sodium chloride    BP 102/47  Pulse 103  Temp(Src) 98.1 F (36.7 C) (Axillary)  Resp 21  Ht 5\' 2"  (1.575 m)  Wt 52.2 kg (115 lb 1.3 oz)  BMI 21.04 kg/m2  SpO2 92%      Intake/Output Summary (Last 24 hours) at 01/11/13 1150 Last data filed at 01/11/13 0700  Gross per 24 hour  Intake 3611.25 ml  Output    350 ml  Net 3261.25 ml     Physical Exam:  General: Appears lethargic HEENT:  NCAT Chest:  Clear bilaterally CVS:  S1-S2 regular Abdomen: Soft nontender Ext: No edema Neuro: Alert oriented x2 lacks decision-making capacity  Labs: CBC    Component Value Date/Time   WBC 7.9 01/10/2013 0520   RBC 3.17* 01/10/2013 0520   HGB 9.7* 01/10/2013 0520   HCT 28.4* 01/10/2013 0520   PLT 36* 01/10/2013 0520   MCV 89.6 01/10/2013 0520   MCH 30.6 01/10/2013 0520   MCHC 34.2 01/10/2013 0520   RDW 17.3* 01/10/2013 0520   LYMPHSABS 1.0 01/07/2013 0430   MONOABS 0.7 01/07/2013 0430   EOSABS 0.0 01/07/2013  0430   BASOSABS 0.0 01/07/2013 0430    BMET    Component Value Date/Time   NA 140 01/10/2013 0520   K 3.6 01/10/2013 0520   CL 113* 01/10/2013 0520   CO2 17* 01/10/2013 0520   GLUCOSE 100* 01/10/2013 0520   BUN 61* 01/10/2013 0520   CREATININE 1.46* 01/10/2013 0520   CALCIUM 7.3* 01/10/2013 0520   GFRNONAA 31* 01/10/2013 0520   GFRAA 36* 01/10/2013 0520    CMP  Component Value Date/Time   NA 140 01/10/2013 0520   K 3.6 01/10/2013 0520   CL 113* 01/10/2013 0520   CO2 17* 01/10/2013 0520   GLUCOSE 100* 01/10/2013 0520   BUN 61* 01/10/2013 0520   CREATININE 1.46* 01/10/2013 0520   CALCIUM 7.3* 01/10/2013 0520   PROT 3.9* 01/10/2013 0520   ALBUMIN 1.3* 01/10/2013 0520   AST 36 01/10/2013 0520   ALT 22 01/10/2013 0520   ALKPHOS 62 01/10/2013 0520   BILITOT 0.1* 01/10/2013 0520   GFRNONAA 31* 01/10/2013 0520   GFRAA 36* 01/10/2013 0520        Time In Time Out Total Time Spent with Patient Total Overall Time  10:30 AM   12 PM   60 minutes   90 minutes     Greater than 50%  of this time was spent counseling and coordinating care related to the above assessment and plan.

## 2013-01-11 NOTE — Consult Note (Signed)
[facility] Consult Note  Referring Provider: No ref. provider found Primary Care Physician:  Terald Sleeper, MD Primary Gastroenterologist:  Dr.  Antony Contras Complaint: Feeding difficulties HPI: Shirley Bond is an 79 y.o. white  female  who was admitted with dehydration malnutrition failure to thrive renal failure and subsequently found to have C. difficile colitis. She was i initially admitted to the intensive care unit and has had a slow recovery with decreased mentation and inability to eat secondary to this. She has had a modified barium swallow after several failed attempts and she had oropharyngeal dysphagia with risk of aspiration for any by mouth it was noted by her daughter that she has a history of an esophageal stricture that apparently has been stretched about once a year in the last 3 years. The daughter also noted the patient complaining before she became ill of some dysphagia. A pain the tube was attempted to be placed but could not be placed blindly. Dr. Dulce Sellar was consulted and recommended that radiology place. The radiologist evaluate the patient was concerned that possibly long-term feeding was needed and did not attempt placement of the planned tube. Since then she has had an ultrasound of the abdomen which shows a small to moderate amount of ascites and a small contracted gallbladder. The patient has really not eaten any significant amounts and she has been here. Her daughter does state that she seems to have been a lot more alert today although she was not very responsive to me. We are consulted to assist in nutritional management..  Past Medical History  Diagnosis Date  . COPD (chronic obstructive pulmonary disease)   . Frequent UTI   . Hypertension   . Arthritis   . Osteoarthritis   . Hypercholesteremia   . Asthma   . Thyroid disease   . Environmental allergies   . Osteopenia   . Macular degeneration   . Hearing loss   . Constipation   . Back pain    . Dementia   . PONV (postoperative nausea and vomiting)   . Hypothyroidism   . Blood transfusion     " no reaction to transfusion "  . Seizures     Past Surgical History  Procedure Laterality Date  . Kyphosis surgery    . Spinal fusion    . Rotator cuff repair      unsuccessful x 2  . Bladder surgery      with mesh & vaginal wall    Medications Prior to Admission  Medication Sig Dispense Refill  . acetaminophen (TYLENOL) 500 MG tablet Take 500 mg by mouth every 8 (eight) hours.      [name] amLODipine (NORVASC) 10 MG tablet Take 1 tablet (10 mg total) by mouth daily.  30 tablet  0  . beta carotene w/minerals (OCUVITE) tablet Take 1 tablet by mouth daily.       . budesonide-formoterol (SYMBICORT) 160-4.5 MCG/ACT inhaler Inhale 2 puffs into the lungs 2 (two) times daily.      . Calcium Carbonate (CALTRATE 600 PO) Take 600 mg by mouth 3 (three) times daily.       . chlorhexidine (PERIDEX) 0.12 % solution Use as directed 15 mLs in the mouth or throat at bedtime.      . conjugated estrogens (PREMARIN) vaginal cream Place 0.5 g vaginally once a week. On Saturdays      . diclofenac sodium (VOLTAREN) 1 % GEL Apply 1 application topically 4 (four) times daily. Massage into painful joints      .  divalproex (DEPAKOTE SPRINKLE) 125 MG capsule Take 250 mg by mouth at bedtime.      . docusate sodium (COLACE) 100 MG capsule Take 100 mg by mouth 2 (two) times daily.      . DULoxetine (CYMBALTA) 60 MG capsule Take 60 mg by mouth daily.      . folic acid (FOLVITE) 1 MG tablet Take 1 tablet (1 mg total) by mouth daily.  30 tablet  0  . ipratropium (ATROVENT) 0.03 % nasal spray Place 1 spray into the nose 2 (two) times daily.      [name] ketotifen (ZADITOR) 0.025 % ophthalmic solution Place 1 drop into both eyes 2 (two) times daily.      [name] levothyroxine (SYNTHROID, LEVOTHROID) 50 MCG tablet Take 50 mcg by mouth daily.      [name] lidocaine (LIDODERM) 5 % Place 1 patch onto the skin daily. Remove & Discard patch  within 12 hours or as directed by MD      . lubiprostone (AMITIZA) 24 MCG capsule Take 24 mcg by mouth 2 (two) times daily with a meal.      . Melatonin 3 MG TABS Take 6 mg by mouth at bedtime.      . metoprolol succinate (TOPROL-XL) 50 MG 24 hr tablet Take 50 mg by mouth daily. Take with or immediately following a meal.      . Nutritional Supplements (BOOST PLUS PO) Take 1 Bottle by mouth 2 (two) times daily.      [name] OLANZapine (ZYPREXA) 2.5 MG tablet Take 2.5 mg by mouth 2 (two) times daily.      . OMEGA 3 1000 MG CAPS Take 2,000 mg by mouth 2 (two) times daily.      [name] omeprazole (PRILOSEC) 20 MG capsule Take 20 mg by mouth daily with lunch.       Bertram Gala Glycol-Propyl Glycol (SYSTANE OP) Place 1-2 drops into both eyes 4 (four) times daily.      . polyethylene glycol powder (MIRALAX) powder Take 17 g by mouth 2 (two) times daily.       . Probiotic Product (PROBIOTIC DAILY PO) Take 1 capsule by mouth 2 (two) times daily.      . ranitidine (ZANTAC) 300 MG tablet Take 300 mg by mouth at bedtime.      . traMADol-acetaminophen (ULTRACET) 37.5-325 MG per tablet Take 1 tablet by mouth 2 (two) times daily. For pain      . vitamin A 40981 UNIT capsule Take 10,000 Units by mouth 2 (two) times daily.         Allergies:  Allergies  Allergen Reactions  . Codeine Other (See Comments)    Unknown.  [name] Hyomax (Hyoscyamine Sulfate) Other (See Comments)    unknown  . Other     Negative reactions to narcotics/opiates  . Oxycodone Hcl Er Other (See Comments)    unknown  . Sulfa Antibiotics Other (See Comments)    unknown    Family History  Problem Relation Age of Onset  . Cancer Sister     Social History:  reports that she has never smoked. She has never used smokeless tobacco. She reports that she does not drink alcohol or use illicit drugs.  Review of Systems: negative except as above   Blood pressure 99/49, pulse 102, temperature 98.1 F (36.7 C), temperature source Axillary, resp. rate  24, height 5\' 2"  (1.575 m), weight 52.2 kg (115 lb 1.3 oz), SpO2 92.00%. Head: Normocephalic, without obvious abnormality, atraumatic Neck: no  adenopathy, no carotid bruit, no JVD, supple, symmetrical, trachea midline and thyroid not enlarged, symmetric, no tenderness/mass/nodules Resp: clear to auscultation bilaterally Cardio: regular rate and rhythm, S1, S2 normal, no murmur, click, rub or gallop GI: Abdomen soft slightly distended with normoactive bowel sounds Extremities: extremities normal, atraumatic, no cyanosis or edema  Results for orders placed during the hospital encounter of 12/04/2012 (from the past 48 hour(s))  BLOOD GAS, ARTERIAL     Status: Abnormal   Collection Time    01/10/13 12:32 AM      Result Value Range   O2 Content 2.0     Delivery systems NASAL CANNULA     pH, Arterial 7.433  7.350 - 7.450   pCO2 arterial 23.1 (*) 35.0 - 45.0 mmHg   pO2, Arterial 91.7  80.0 - 100.0 mmHg   Bicarbonate 15.2 (*) 20.0 - 24.0 mEq/L   TCO2 15.9  0 - 100 mmol/L   Acid-base deficit 8.3 (*) 0.0 - 2.0 mmol/L   O2 Saturation 97.1     Patient temperature 98.6     Collection site RIGHT RADIAL     Drawn by [id number]     Sample type ARTERIAL DRAW     Allens test (pass/fail) PASS  PASS  MAGNESIUM     Status: None   Collection Time    01/10/13  5:20 AM      Result Value Range   Magnesium 1.7  1.5 - 2.5 mg/dL  PHOSPHORUS     Status: None   Collection Time    01/10/13  5:20 AM      Result Value Range   Phosphorus 3.7  2.3 - 4.6 mg/dL  PREALBUMIN     Status: Abnormal   Collection Time    01/10/13  5:20 AM      Result Value Range   Prealbumin 9.1 (*) 17.0 - 34.0 mg/dL  COMPREHENSIVE METABOLIC PANEL     Status: Abnormal   Collection Time    01/10/13  5:20 AM      Result Value Range   Sodium 140  135 - 145 mEq/L   Potassium 3.6  3.5 - 5.1 mEq/L   Chloride 113 (*) 96 - 112 mEq/L   CO2 17 (*) 19 - 32 mEq/L   Glucose, Bld 100 (*) 70 - 99 mg/dL   BUN 61 (*) 6 - 23 mg/dL   Creatinine, Ser  4.09 (*) 0.50 - 1.10 mg/dL   Calcium 7.3 (*) 8.4 - 10.5 mg/dL   Total Protein 3.9 (*) 6.0 - 8.3 g/dL   Albumin 1.3 (*) 3.5 - 5.2 g/dL   AST 36  0 - 37 U/L   ALT 22  0 - 35 U/L   Alkaline Phosphatase 62  39 - 117 U/L   Total Bilirubin 0.1 (*) 0.3 - 1.2 mg/dL   GFR calc non Af Amer 31 (*) >90 mL/min   GFR calc Af Amer 36 (*) >90 mL/min   Comment:            The eGFR has been calculated     using the CKD EPI equation.     This calculation has not been     validated in all clinical     situations.     eGFR's persistently     <90 mL/min signify     possible Chronic Kidney Disease.  CBC     Status: Abnormal   Collection Time    01/10/13  5:20 AM      Result  Value Range   WBC 7.9  4.0 - 10.5 K/uL   RBC 3.17 (*) 3.87 - 5.11 MIL/uL   Hemoglobin 9.7 (*) 12.0 - 15.0 g/dL   HCT 45.4 (*) 09.8 - 11.9 %   MCV 89.6  78.0 - 100.0 fL   MCH 30.6  26.0 - 34.0 pg   MCHC 34.2  30.0 - 36.0 g/dL   RDW 14.7 (*) 82.9 - 56.2 %   Platelets 36 (*) 150 - 400 K/uL   Comment: CONSISTENT WITH PREVIOUS RESULT   US Abdomen Complete  01/10/2013  *RADIOLOGY REPORT*  Clinical Data:  Evaluate this  COMPLETE ABDOMINAL ULTRASOUND  Comparison:  These renal ultrasound 01/03/2013  Findings:  Gallbladder:  The gallbladder is mildly thick-walled at 4 mm.  This may relate to ascites with passive edema.  No echogenic gallstones evident.  Murphy's sign was not evaluable due to patient in a non responsive state.  Common bile duct:  Normal at 5 mm  Liver:  The liver is mildly lobular in contour.  No duct dilatation.  There is a moderate volume ascites surrounding the liver margin  IVC:  Appears normal.  Pancreas:  No focal abnormality seen.  Spleen:  Normal in size and echogenicity.  Right Kidney:  8.0cm in length.  There is cortical thinning.  There is a 1.3 cm anechoic cyst within the upper pole with a thin septation.  Left Kidney:  11.1cm in length.  Cortical thinning.  Abdominal aorta:  No aneurysm identified.  IMPRESSION:  1.   Moderate volume ascites and bilateral pleural effusions. 2.  Lobular liver. 3.  Gallbladder wall thickening likely secondary to ascites. 4.  Bilateral renal cortical thinning. 5.  Probable benign cyst in the upper pole of the right kidney.   Original Report Authenticated By: Genevive Bi, M.D.    Dg Chest Port 1 View  01/10/2013  *RADIOLOGY REPORT*  Clinical Data: Shortness of breath  PORTABLE CHEST - 1 VIEW  Comparison: None.  Findings: Shallow inspiration with atelectasis or infiltration in both lung bases.  Blunting of costophrenic angles suggesting small pleural effusions.  Normal heart size and pulmonary vascularity. No pneumothorax.  Left central venous catheter with tip in the cavoatrial junction.  Degenerative changes and postoperative changes in the spine.  Marked degenerative changes in both shoulders with loss of sub acromial space consistent with rotator cuff arthropathy.  Resection or resorption of the distal clavicles.  IMPRESSION: Shallow inspiration with infiltration or atelectasis in the lung bases.  Small bilateral pleural effusions.   Original Report Authenticated By: Burman Nieves, M.D.    Dg Abd Portable 1v  01/10/2013  *RADIOLOGY REPORT*  Clinical Data: Abdominal pain, C diff  PORTABLE ABDOMEN - 1 VIEW  Comparison: Abdomen films of 01/05/2013  Findings: There has been a decrease in the gaseous distention of small bowel noted previously.  No bowel obstruction is seen. Degenerative changes are present diffusely throughout the lumbar spine and hardware for fusion from T11-L2 is noted.  IMPRESSION: Decrease in gaseous distention of small bowel.  No obstruction.   Original Report Authenticated By: Dwyane Dee, M.D.     Assessment: 1. In adequate nutrition related to acute illness in global decreased mentation precluding by mouth intake. 2. Possible pre-existing esophageal stricture, not clear how this is contributing to her current inability to take nutrition 3. Mild to moderate  ascites probably passive secondary to malnutrition  Plan:  1. Had a long discussion with daughter about options for feeding. I am hesitant to  pursue EGD as long as she is in such a frail state and his other was unable to take by mouth as whether first esophagus is dilated or not. Since the daughter thinks that she may have been making a turnaround and in deed her C. difficile has been treated in her azotemia reversed would consider EGD with dilatation. Otherwise will probably ask radiology to reevaluate and consider placing a banded tube radiologically foreshortened medium term feedings while she declares whether she will need long-term enteral supplementation. Ascites would be a relative contraindication to PEG at this time. Jinx Gilden C 01/11/2013, 1:52 PM

## 2013-01-11 NOTE — Progress Notes (Signed)
[facility] PROGRESS NOTE  Shirley Bond:[id number] DOB: [birth year] DOA: 12/18/2012 PCP: Terald Sleeper, MD  Brief narrative:  79 y/o female who was recenrtly treated for UTI followed by  diarrhea for almost a week with poor po intake.Her mental status declined and she had been becoming progressively more confused and ultimately minimally responsive. In ER she was found to be dehydrated with Cr of 5.3 and WBC of 21.7.  Of note she has a hx of esophageal strictures and gets dilatations done about once a year by Dr Chales Abrahams at [facility]. Her last EGD was in June 2013.Shirley Bond   After her admission she was noted to be in septic shock from C.diff. PCCM was consulted on 2/2 and assumed care of the pt until 01/07/2013 and transferred to step down.   Assessment/Plan:  Sepsis with  hypovolemic shock  Due to C diff colitis . Resuscitated with IV fluids. Vitals stable but poor urine output noted.  C diff colitis  Cont IV flagyl ( day 8) and vanco enema ( day 3, PO vanco since 2/1). Diarrhea has improved.  Acute kidney injury  Creatinine slowly improving with IV fluids Decrease urine out put. No evidence of bladder obstruction on USG. Shows ascites and some pleural effusion. Will give a dose of  IV lasix and monitor. Discontinue IV fluids  Hypernatremia  Due to hypovolemia as well as true DH/free water deficit . Now improving  Metabolic acidosis  from bicarb loss w/ diarrhea, acute renal failure, and NS resuscitation efforts - bicarb beginning to decline again. Monitor for now and if placed back on fluids will add bicarb as well.  Thrombocytopnia  Likely due to acute infection - continues to drop which is concerning - continue to avoid heparin products  <50 - no spontaneous blood loss at this time   Acute encephalopathy  Slowly improving - likely due to toxic metabolic encephalopathy  in setting of severe infection and acute renal failure .  Mildly elevated TSH  Possibly related to  infection. Synthroid dose increased this admission. Monitor as outpatient  Reported hx of esophageal strictures  GI has seen - no plans for endo in setting of active GI infection - unfortunately due to the results of the modified barium swallow we are unable to give her clear liquids.Shirley Bond Spoke with Radiologist who was  recommending PEG.have asked Dr Madilyn Fireman to evaluate. Gets esophageal dilatation by Dr Chales Abrahams in [place] once a year.  Ascites:  Etiology unclear. ? Fluid overload. Will ask GI to evaluate   Decrease urine Out put:  Ascites and pelural effusion noted. Will give a dse of IV lasix to see for improvement. USG shows no obstruction.    Code Status: DNR after discussion with family  Family Communication: son and daughter at bedside. Disposition Plan: currently inpatient Consultants:  PCCM  [facility]  Procedures:  2/2 CVL L IJ >>  2/4 TTE - poor acustic windows - no useful information obtained  Antibiotics:  2/1 vanc >>  2/1 flagyl >>   DVT prophylaxis:  SCDs   HPI/Subjective:  Patient is awake but poorly communicative  Objective: Filed Vitals:   01/10/13 1500 01/10/13 2100 01/11/13 0500 01/11/13 0937  BP: 109/95 121/59 102/47   Pulse: 82 87 103   Temp: 97.4 F (36.3 C) 99.1 F (37.3 C) 98.1 F (36.7 C)   TempSrc: Axillary     Resp: 14 16 21    Height:      Weight:      SpO2: 92%  92%    Intake/Output Summary (Last 24 hours) at 01/11/13 1151 Last data filed at 01/11/13 0700  Gross per 24 hour  Intake 3611.25 ml  Output    350 ml  Net 3261.25 ml   Filed Weights   01/03/13 0414 01/03/13 0653  Weight: 49.8 kg (109 lb 12.6 oz) 52.2 kg (115 lb 1.3 oz)    Exam:  General: elderly frail female in NAD HEENT: Left IJ, no pallor, dry oral mucosa Lungs: Bilateral ronchus, crackles  Cardiovascular: Regular rate and rhythm without murmur gallop or rub  Abdomen: mildly distended, bowel sounds positive, no rebound, no ascites, no appreciable mass  Extremities: No  significant cyanosis, clubbing, or edema bilateral lower extremities. Foley in place with dark urine CNS: alert and aware, poorly communicative    Data Reviewed: Basic Metabolic Panel:  Recent Labs Lab 01/04/13 1715  01/07/13 0430 01/07/13 1224 01/08/13 0500 01/09/13 0525 01/10/13 0520  NA 151*  < > 153* 153* 150* 144 140  K 4.6  < > 4.4 4.1 3.9 3.4* 3.6  CL 123*  < > 121* 120* 119* 114* 113*  CO2 13*  < > 21 20 18* 18* 17*  GLUCOSE 90  < > 85 85 82 161* 100*  BUN 96*  < > 80* 79* 75* 70* 61*  CREATININE 4.65*  < > 2.46* 2.32* 1.96* 1.74* 1.46*  CALCIUM 7.1*  < > 7.9* 7.8* 7.6* 7.6* 7.3*  MG 2.4  --   --   --   --   --  1.7  PHOS  --   --   --   --   --   --  3.7  < > = values in this interval not displayed. Liver Function Tests:  Recent Labs Lab 01/04/13 1715 01/08/13 0500 01/10/13 0520  AST 38* 55* 36  ALT 15 24 22   ALKPHOS 128* 68 62  BILITOT 0.1* 0.1* 0.1*  PROT 4.5* 4.1* 3.9*  ALBUMIN 1.3* 1.4* 1.3*   No results found for this basename: LIPASE, AMYLASE,  in the last 168 hours No results found for this basename: AMMONIA,  in the last 168 hours CBC:  Recent Labs Lab 01/06/13 0446 01/07/13 0430 01/08/13 0500 01/09/13 0525 01/10/13 0520  WBC 12.5* 8.7 7.5 6.8 7.9  NEUTROABS  --  7.0  --   --   --   HGB 10.6* 10.6* 10.1* 9.6* 9.7*  HCT 30.9* 30.8* 30.2* 27.8* 28.4*  MCV 87.3 87.7 89.3 89.1 89.6  PLT 79* 48* 28* 25* 36*   Cardiac Enzymes:  Recent Labs Lab 01/04/13 1445  TROPONINI <0.30   BNP (last 3 results) No results found for this basename: PROBNP,  in the last 8760 hours CBG: No results found for this basename: GLUCAP,  in the last 168 hours  Recent Results (from the past 240 hour(s))  MRSA PCR SCREENING     Status: None   Collection Time    01/03/13  3:36 AM      Result Value Range Status   MRSA by PCR NEGATIVE  NEGATIVE Final   Comment:            The GeneXpert MRSA Assay (FDA     approved for NASAL specimens     only), is one  component of a     comprehensive MRSA colonization     surveillance program. It is not     intended to diagnose MRSA     infection nor to guide or  monitor treatment for     MRSA infections.  CLOSTRIDIUM DIFFICILE BY PCR     Status: Abnormal   Collection Time    01/03/13 10:37 AM      Result Value Range Status   C difficile by pcr POSITIVE (*) NEGATIVE Final   Comment: CRITICAL RESULT CALLED TO, READ BACK BY AND VERIFIED WITH:     J.DAVIS,RN 1610 01/03/13 EHOWARD  STOOL CULTURE     Status: None   Collection Time    01/03/13 10:38 AM      Result Value Range Status   Specimen Description STOOL   Final   Special Requests NONE   Final   Culture     Final   Value: NO SALMONELLA, SHIGELLA, CAMPYLOBACTER, YERSINIA, OR E.COLI 0157:H7 ISOLATED   Report Status 01/07/2013 FINAL   Final  CULTURE, BLOOD (ROUTINE X 2)     Status: None   Collection Time    01/04/13  1:50 PM      Result Value Range Status   Specimen Description BLOOD RIGHT ARM   Final   Special Requests BOTTLES DRAWN AEROBIC AND ANAEROBIC 10CC   Final   Culture  Setup Time 01/05/2013 15:28   Final   Culture NO GROWTH 5 DAYS   Final   Report Status 01/11/2013 FINAL   Final  CULTURE, BLOOD (ROUTINE X 2)     Status: None   Collection Time    01/04/13  2:00 PM      Result Value Range Status   Specimen Description BLOOD RIGHT ARM   Final   Special Requests BOTTLES DRAWN AEROBIC ONLY 10CC   Final   Culture  Setup Time 01/05/2013 15:27   Final   Culture NO GROWTH 5 DAYS   Final   Report Status 01/11/2013 FINAL   Final     Studies: US Abdomen Complete  01/10/2013  *RADIOLOGY REPORT*  Clinical Data:  Evaluate this  COMPLETE ABDOMINAL ULTRASOUND  Comparison:  These renal ultrasound 01/03/2013  Findings:  Gallbladder:  The gallbladder is mildly thick-walled at 4 mm.  This may relate to ascites with passive edema.  No echogenic gallstones evident.  Murphy's sign was not evaluable due to patient in a non responsive state.  Common  bile duct:  Normal at 5 mm  Liver:  The liver is mildly lobular in contour.  No duct dilatation.  There is a moderate volume ascites surrounding the liver margin  IVC:  Appears normal.  Pancreas:  No focal abnormality seen.  Spleen:  Normal in size and echogenicity.  Right Kidney:  8.0cm in length.  There is cortical thinning.  There is a 1.3 cm anechoic cyst within the upper pole with a thin septation.  Left Kidney:  11.1cm in length.  Cortical thinning.  Abdominal aorta:  No aneurysm identified.  IMPRESSION:  1.  Moderate volume ascites and bilateral pleural effusions. 2.  Lobular liver. 3.  Gallbladder wall thickening likely secondary to ascites. 4.  Bilateral renal cortical thinning. 5.  Probable benign cyst in the upper pole of the right kidney.   Original Report Authenticated By: Genevive Bi, M.D.    Dg Chest Port 1 View  01/10/2013  *RADIOLOGY REPORT*  Clinical Data: Shortness of breath  PORTABLE CHEST - 1 VIEW  Comparison: None.  Findings: Shallow inspiration with atelectasis or infiltration in both lung bases.  Blunting of costophrenic angles suggesting small pleural effusions.  Normal heart size and pulmonary vascularity. No pneumothorax.  Left central venous catheter with  tip in the cavoatrial junction.  Degenerative changes and postoperative changes in the spine.  Marked degenerative changes in both shoulders with loss of sub acromial space consistent with rotator cuff arthropathy.  Resection or resorption of the distal clavicles.  IMPRESSION: Shallow inspiration with infiltration or atelectasis in the lung bases.  Small bilateral pleural effusions.   Original Report Authenticated By: Burman Nieves, M.D.    Dg Abd Portable 1v  01/10/2013  *RADIOLOGY REPORT*  Clinical Data: Abdominal pain, C diff  PORTABLE ABDOMEN - 1 VIEW  Comparison: Abdomen films of 01/05/2013  Findings: There has been a decrease in the gaseous distention of small bowel noted previously.  No bowel obstruction is seen.  Degenerative changes are present diffusely throughout the lumbar spine and hardware for fusion from T11-L2 is noted.  IMPRESSION: Decrease in gaseous distention of small bowel.  No obstruction.   Original Report Authenticated By: Dwyane Dee, M.D.     Scheduled Meds: . antiseptic oral rinse  15 mL Mouth Rinse BID  . budesonide-formoterol  2 puff Inhalation BID  . furosemide  40 mg Intravenous Once  . Gerhardt's butt cream   Topical TID  . ipratropium  2 spray Each Nare BID  . levothyroxine  38 mcg Intravenous Daily  . metronidazole  500 mg Intravenous Q8H  . olopatadine  1 drop Both Eyes BID  . vancomycin (VANCOCIN) rectal ENEMA  500 mg Rectal Q6H   Continuous Infusions: .  sodium bicarbonate  infusion 1000 mL 125 mL/hr at 01/10/13 1557      Time spent: 25 minutes    Korrie Hofbauer  [facility] Pager [phone]. If 8PM-8AM, please contact night-coverage at [email or web address], password [facility] 01/11/2013, 11:51 AM  LOS: 9 days

## 2013-01-11 NOTE — Progress Notes (Signed)
Pt had 7 beat run of Vtach. Dr. Gonzella Lex on floor and made aware. No new orders received. Will continue to monitor.

## 2013-01-11 NOTE — Progress Notes (Signed)
PARENTERAL NUTRITION CONSULT NOTE - INITIAL  Pharmacy Consult for TPN Indication: Hx esophageal strictures, prolonged NPO status, intolerance to enteral feeds  Allergies  Allergen Reactions  . Codeine Other (See Comments)    Unknown.  [name] Hyomax (Hyoscyamine Sulfate) Other (See Comments)    unknown  . Other     Negative reactions to narcotics/opiates  . Oxycodone Hcl Er Other (See Comments)    unknown  . Sulfa Antibiotics Other (See Comments)    unknown    Patient Measurements: Height: 5\' 2"  (157.5 cm) (per daughter) Weight: 115 lb 1.3 oz (52.2 kg) IBW/kg (Calculated) : 50.1  Vital Signs: Temp: 98.1 F (36.7 C) (02/09 0500) BP: 99/49 mmHg (02/09 1200) Pulse Rate: 102 (02/09 1200) Intake/Output from previous day: 02/08 0701 - 02/09 0700 In: 3771.3 [I.V.:2761.3; IV Piggyback:300] Out: 410 [Urine:410] Intake/Output from this shift:    Labs:  Recent Labs  01/09/13 0525 01/10/13 0520  WBC 6.8 7.9  HGB 9.6* 9.7*  HCT 27.8* 28.4*  PLT 25* 36*     Recent Labs  01/09/13 0525 01/10/13 0520  NA 144 140  K 3.4* 3.6  CL 114* 113*  CO2 18* 17*  GLUCOSE 161* 100*  BUN 70* 61*  CREATININE 1.74* 1.46*  CALCIUM 7.6* 7.3*  MG  --  1.7  PHOS  --  3.7  PROT  --  3.9*  ALBUMIN  --  1.3*  AST  --  36  ALT  --  22  ALKPHOS  --  62  BILITOT  --  0.1*  PREALBUMIN  --  9.1*   Estimated Creatinine Clearance: 21.9 ml/min (by C-G formula based on Cr of 1.46).   No results found for this basename: GLUCAP,  in the last 72 hours  Medical History: Past Medical History  Diagnosis Date  . COPD (chronic obstructive pulmonary disease)   . Frequent UTI   . Hypertension   . Arthritis   . Osteoarthritis   . Hypercholesteremia   . Asthma   . Thyroid disease   . Environmental allergies   . Osteopenia   . Macular degeneration   . Hearing loss   . Constipation   . Back pain   . Dementia   . PONV (postoperative nausea and vomiting)   . Hypothyroidism   . Blood  transfusion     " no reaction to transfusion "  . Seizures     Medications:  Scheduled:  . [EXPIRED] acetaminophen  1,000 mg Intravenous Once  . [COMPLETED] acetaminophen  1,000 mg Intravenous Once  . antiseptic oral rinse  15 mL Mouth Rinse BID  . budesonide-formoterol  2 puff Inhalation BID  . [COMPLETED] furosemide  40 mg Intravenous Once  . Gerhardt's butt cream   Topical TID  . ipratropium  2 spray Each Nare BID  . levothyroxine  38 mcg Intravenous Daily  . metronidazole  500 mg Intravenous Q8H  . olopatadine  1 drop Both Eyes BID  . vancomycin (VANCOCIN) rectal ENEMA  500 mg Rectal Q6H   Infusions:  .  sodium bicarbonate  infusion 1000 mL 125 mL/hr at 01/10/13 1557  . [DISCONTINUED] dextrose 5 % and 0.45% NaCl 125 mL/hr at 01/10/13 1252    Insulin Requirements in the past 24 hours:  No insulin ordered  Current Nutrition:  NPO  Assessment: 20 yof admitted on 1/31 d/t diarrhea/ Cdiff colitis. Also noted hx of esophageal strictures. Pt failed her MBS and has essentially been NPO since admission. Determining plans for possible  PEG placement. Palliative care consult.   GI: Admitted with Cdiff colitis, NPO since admission d/t failed MBS, hx esophageal strictures  Endo: No hx DM, hx hypothyroid (TSH 5.6) - serum CBGs ok, no insulin coverage Lytes: Na 140, K 3.6, Phos 3.7, Mg 1.7 Renal: Scr 1.46 (improving), UOP 0.34ml/kg/hr - bicarb gtt, lasix Pulm: 92% 2L [place] - symbicort Cards: Hx HTN, HLD - BP 99/49, HR 102 - no meds Hepatobil: LFTs WNL, prealbumin 9.1, Tbili low  Neuro: Hx dementia - A&O, palliative care involved ID: Vanc PR, flagyl for Cdiff - afebrile, WBC WNL     Best Practices: SCDs TPN Access: CVC TPN day#: 0  Nutritional Goals:  1350-1550 kCal, 75-85 grams of protein per day, 1.4-1.6L  Plan:  1. Start clinimix E 5/15 at 98ml/hr 2. Lipids, MVI, TE on MWF only d/t national shortage 3. F/u AM TPN labs 4. F/u palliative plans 5. MD please address bicarb gtt and  fluids 6. Start SSI tonight to assess glucose control on TPN  Takelia Urieta, Drake Leach 01/11/2013,1:32 PM

## 2013-01-11 NOTE — Accreditation Note (Signed)
Came by to see patient yesterday, pt nonverbal, daughter not available.Note palliative care consult. Will await results before addressing poss PEG, etc.

## 2013-01-12 LAB — PREALBUMIN: Prealbumin: 7.5 mg/dL — ABNORMAL LOW (ref 17.0–34.0)

## 2013-01-12 LAB — GLUCOSE, CAPILLARY
Glucose-Capillary: 140 mg/dL — ABNORMAL HIGH (ref 70–99)
Glucose-Capillary: 114 mg/dL — ABNORMAL HIGH (ref 70–99)
Glucose-Capillary: 142 mg/dL — ABNORMAL HIGH (ref 70–99)

## 2013-01-12 LAB — COMPREHENSIVE METABOLIC PANEL
BUN: 46 mg/dL — ABNORMAL HIGH (ref 6–23)
Calcium: 6.9 mg/dL — ABNORMAL LOW (ref 8.4–10.5)
Creatinine, Ser: 1.18 mg/dL — ABNORMAL HIGH (ref 0.50–1.10)
GFR calc Af Amer: 47 mL/min — ABNORMAL LOW (ref >90)
Glucose, Bld: 139 mg/dL — ABNORMAL HIGH (ref 70–99)
Total Protein: 3.6 g/dL — ABNORMAL LOW (ref 6.0–8.3)

## 2013-01-12 LAB — CBC
HCT: 27.6 % — ABNORMAL LOW (ref 36.0–46.0)
Hemoglobin: 9.7 g/dL — ABNORMAL LOW (ref 12.0–15.0)
MCH: 30.6 pg (ref 26.0–34.0)
MCHC: 35.1 g/dL (ref 30.0–36.0)

## 2013-01-12 LAB — DIFFERENTIAL
Basophils Absolute: 0 10*3/uL (ref 0.0–0.1)
Eosinophils Absolute: 0.1 10*3/uL (ref 0.0–0.7)
Lymphocytes Relative: 9 % — ABNORMAL LOW (ref 12–46)
Neutro Abs: 7.4 10*3/uL (ref 1.7–7.7)
Neutrophils Relative %: 85 % — ABNORMAL HIGH (ref 43–77)

## 2013-01-12 LAB — MAGNESIUM: Magnesium: 1.5 mg/dL (ref 1.5–2.5)

## 2013-01-12 MED ORDER — CHLORHEXIDINE GLUCONATE 0.12 % MT SOLN
15.0000 mL | Freq: Two times a day (BID) | OROMUCOSAL | Status: DC
  Administered 2013-01-12 – 2013-01-21 (×15): 15 mL via OROMUCOSAL
  Filled 2013-01-12 (×24): qty 15

## 2013-01-12 MED ORDER — FUROSEMIDE 10 MG/ML IJ SOLN: 20.0000 mg | Freq: Once | INTRAMUSCULAR | Status: AC

## 2013-01-12 MED ORDER — POTASSIUM CHLORIDE 10 MEQ/50ML IV SOLN
10.0000 meq | INTRAVENOUS | Status: AC
  Administered 2013-01-12 (×6): 10 meq via INTRAVENOUS
  Filled 2013-01-12 (×6): qty 50

## 2013-01-12 MED ORDER — FUROSEMIDE 10 MG/ML IJ SOLN
INTRAMUSCULAR | Status: AC
  Administered 2013-01-12: 20 mg
  Filled 2013-01-12: qty 4

## 2013-01-12 MED ORDER — TRACE MINERALS CR-CU-F-FE-I-MN-MO-SE-ZN IV SOLN
INTRAVENOUS | Status: AC
  Administered 2013-01-12: 17:00:00 via INTRAVENOUS
  Filled 2013-01-12: qty 2000

## 2013-01-12 MED ORDER — ACETAMINOPHEN 10 MG/ML IV SOLN
1000.0000 mg | Freq: Once | INTRAVENOUS | Status: AC
  Administered 2013-01-12: 1000 mg via INTRAVENOUS
  Filled 2013-01-12: qty 100

## 2013-01-12 MED ORDER — FAT EMULSION 20 % IV EMUL
250.0000 mL | INTRAVENOUS | Status: AC
  Administered 2013-01-12: 250 mL via INTRAVENOUS
  Filled 2013-01-12: qty 250

## 2013-01-12 MED ORDER — MAGNESIUM SULFATE IN D5W 10-5 MG/ML-% IV SOLN
1.0000 g | Freq: Once | INTRAVENOUS | Status: AC
  Administered 2013-01-12: 1 g via INTRAVENOUS
  Filled 2013-01-12: qty 100

## 2013-01-12 NOTE — Progress Notes (Signed)
Patient transferred unit 3W and to this CSW today-plans per report are for d/c back to [place] once stable- patient discussed in morning rounds- Palliative consulted and will await further guidance on plans per their visit with family- Reece Levy, MSW, [place] [phone]

## 2013-01-12 NOTE — Progress Notes (Signed)
[facility] PROGRESS NOTE  Shirley Bond ZOX:[id number] DOB: Apr 14, 1924 DOA: 12/21/2012 PCP: Terald Sleeper, MD  Brief narrative:  79 y/o female who was recenrtly treated for UTI followed by  diarrhea for almost a week with poor po intake.Her mental status declined and she had been becoming progressively more confused and ultimately minimally responsive. In ER she was found to be dehydrated with Cr of 5.3 and WBC of 21.7.  Of note she has a hx of esophageal strictures and gets dilatations done about once a year by Dr Chales Abrahams at [place]. Her last EGD was in June 2013.[name]   After her admission she was noted to be in septic shock from C.diff. PCCM was consulted on 2/2 and assumed care of the pt until 01/07/2013 and transferred to step down.   Assessment/Plan:  Septic shock  Due to C diff colitis .  Patient was admitted to ICU and treated with abx and iv fluids, CVC was placed on 2/2. Patient was placed on early goal directed therapy protocol and she required pressors briefly.     C diff colitis  Patient was placed on iv flagyl and po vanc on admission .  We did add vancomycin enema on 2/6  Acute kidney injury due to ATN from sepsis  Patient was placed on iv fluids with improvement of renal function  Peak creatinine was 5.44 on 01/03/13 -  Creatinine down to 1.1 on 01/12/13  Decrease urine out put. No evidence of bladder obstruction on [place]. Shows ascites and some pleural effusion. Will give a dose of  IV lasix and monitor. Discontinued IV fluids on 01/12/13  Hypotension  Cortisol checked on 2/2 - and it was adequately elevated - no suspicion for adrenal insufficiency    Massive anasarca  After fluid resuscitation in a patient with very low albumin level - 1.2   Hypernatremia  Due to hypovolemia as well as true DH/free water deficit . Resolved with iv fluids.  Ongoing monitoring on TPN  Hypokalemia and Hypocalcemia and Hypomagnesemia  Currently controlled with  TPN   Metabolic acidosis  from bicarb loss w/ diarrhea, acute renal failure, and NS resuscitation efforts - bicarb beginning to decline again. Monitor for now and if placed back on fluids will add bicarb as well.  Thrombocytopnia  Likely due to acute infection - - continue to avoid heparin products    - no spontaneous blood loss at this time  - improving    Acute encephalopathy  Slowly improving - likely due to toxic metabolic encephalopathy  in setting of severe infection and acute renal failure .  Mildly elevated TSH  Possibly related to infection. Synthroid dose increased this admission. Monitor as outpatient  Reported hx of esophageal strictures  GI has seen - no plans for endo in setting of active GI infection - unfortunately due to the results of the modified barium swallow we are unable to give her clear liquids.[name] Spoke with Radiologist who was  recommending PEG.have asked Dr Madilyn Fireman to evaluate. Gets esophageal dilatation by Dr Chales Abrahams in [place] once a year.   Code Status: DNR after discussion with family  Family Communication: son and daughter at bedside. Disposition Plan: currently inpatient Consultants:  PCCM  [facility]  Procedures:  2/2 CVL L IJ >>  2/4 TTE - poor acustic windows - no useful information obtained  Antibiotics:  2/1 vanc >>  2/1 flagyl >>   DVT prophylaxis:  SCDs   HPI/Subjective:  Tired and congested - non verbal  Objective: Filed Vitals:   01/12/13 0425 01/12/13 0700 01/12/13 0827 01/12/13 1350  BP: 74/39 95/44  92/54  Pulse: 40 92  91  Temp: 98.3 F (36.8 C)   98.2 F (36.8 C)  TempSrc: Oral   Oral  Resp: 18   20  Height:      Weight:      SpO2:   96% 96%    Intake/Output Summary (Last 24 hours) at 01/12/13 1659 Last data filed at 01/12/13 1227  Gross per 24 hour  Intake 2547.5 ml  Output    800 ml  Net 1747.5 ml   Filed Weights   01/03/13 0414 01/03/13 0653  Weight: 49.8 kg (109 lb 12.6 oz) 52.2 kg (115 lb 1.3 oz)     Exam:  General: elderly frail female in NAD Lungs: Bilateral ronchus, crackles  Cardiovascular: Regular rate and rhythm without murmur gallop or rub  Abdomen: mildly distended, bowel sounds positive, no rebound, no ascites, no appreciable mass  Extremities: bilateral edema -     Data Reviewed: Basic Metabolic Panel:  Recent Labs Lab 01/07/13 1224 01/08/13 0500 01/09/13 0525 01/10/13 0520 01/11/13 1520 01/12/13 0500  NA 153* 150* 144 140  --  140  K 4.1 3.9 3.4* 3.6  --  2.9*  CL 120* 119* 114* 113*  --  106  CO2 20 18* 18* 17*  --  25  GLUCOSE 85 82 161* 100*  --  139*  BUN 79* 75* 70* 61*  --  46*  CREATININE 2.32* 1.96* 1.74* 1.46*  --  1.18*  CALCIUM 7.8* 7.6* 7.6* 7.3*  --  6.9*  MG  --   --   --  1.7 1.6 1.5  PHOS  --   --   --  3.7  --  3.4   Liver Function Tests:  Recent Labs Lab 01/08/13 0500 01/10/13 0520 01/12/13 0500  AST 55* 36 28  ALT 24 22 21   ALKPHOS 68 62 47  BILITOT 0.1* 0.1* 0.1*  PROT 4.1* 3.9* 3.6*  ALBUMIN 1.4* 1.3* 1.2*   No results found for this basename: LIPASE, AMYLASE,  in the last 168 hours No results found for this basename: AMMONIA,  in the last 168 hours CBC:  Recent Labs Lab 01/07/13 0430 01/08/13 0500 01/09/13 0525 01/10/13 0520 01/12/13 0500  WBC 8.7 7.5 6.8 7.9 8.7  NEUTROABS 7.0  --   --   --  7.4  HGB 10.6* 10.1* 9.6* 9.7* 9.7*  HCT 30.8* 30.2* 27.8* 28.4* 27.6*  MCV 87.7 89.3 89.1 89.6 87.1  PLT 48* 28* 25* 36* 72*   Cardiac Enzymes: No results found for this basename: CKTOTAL, CKMB, CKMBINDEX, TROPONINI,  in the last 168 hours BNP (last 3 results) No results found for this basename: PROBNP,  in the last 8760 hours CBG:  Recent Labs Lab 01/11/13 2353 01/12/13 0421 01/12/13 0715 01/12/13 1116 01/12/13 1545  GLUCAP 117* 153* 140* 130* 115*    Recent Results (from the past 240 hour(s))  MRSA PCR SCREENING     Status: None   Collection Time    01/03/13  3:36 AM      Result Value Range Status    MRSA by PCR NEGATIVE  NEGATIVE Final   Comment:            The GeneXpert MRSA Assay (FDA     approved for NASAL specimens     only), is one component of a     comprehensive MRSA colonization  surveillance program. It is not     intended to diagnose MRSA     infection nor to guide or     monitor treatment for     MRSA infections.  CLOSTRIDIUM DIFFICILE BY PCR     Status: Abnormal   Collection Time    01/03/13 10:37 AM      Result Value Range Status   C difficile by pcr POSITIVE (*) NEGATIVE Final   Comment: CRITICAL RESULT CALLED TO, READ BACK BY AND VERIFIED WITH:     J.DAVIS,RN 1610 01/03/13 EHOWARD  STOOL CULTURE     Status: None   Collection Time    01/03/13 10:38 AM      Result Value Range Status   Specimen Description STOOL   Final   Special Requests NONE   Final   Culture     Final   Value: NO SALMONELLA, SHIGELLA, CAMPYLOBACTER, YERSINIA, OR E.COLI 0157:H7 ISOLATED   Report Status 01/07/2013 FINAL   Final  CULTURE, BLOOD (ROUTINE X 2)     Status: None   Collection Time    01/04/13  1:50 PM      Result Value Range Status   Specimen Description BLOOD RIGHT ARM   Final   Special Requests BOTTLES DRAWN AEROBIC AND ANAEROBIC 10CC   Final   Culture  Setup Time 01/05/2013 15:28   Final   Culture NO GROWTH 5 DAYS   Final   Report Status 01/11/2013 FINAL   Final  CULTURE, BLOOD (ROUTINE X 2)     Status: None   Collection Time    01/04/13  2:00 PM      Result Value Range Status   Specimen Description BLOOD RIGHT ARM   Final   Special Requests BOTTLES DRAWN AEROBIC ONLY 10CC   Final   Culture  Setup Time 01/05/2013 15:27   Final   Culture NO GROWTH 5 DAYS   Final   Report Status 01/11/2013 FINAL   Final     Studies: No results found.  Scheduled Meds: . antiseptic oral rinse  15 mL Mouth Rinse BID  . budesonide-formoterol  2 puff Inhalation BID  . chlorhexidine  15 mL Mouth Rinse BID  . Gerhardt's butt cream   Topical TID  . insulin aspart  0-9 Units Subcutaneous  Q4H  . ipratropium  2 spray Each Nare BID  . levothyroxine  38 mcg Intravenous Daily  . metronidazole  500 mg Intravenous Q8H  . olopatadine  1 drop Both Eyes BID  . vancomycin (VANCOCIN) rectal ENEMA  500 mg Rectal Q6H   Continuous Infusions: . TPN (CLINIMIX) +/- additives     And  . fat emulsion    . TPN (CLINIMIX) +/- additives 30 mL/hr at 01/12/13 0036        Ryleigh Esqueda  [facility] Pager [phone]. If 7PM-7AM, please contact night-coverage at [email or web address], password [facility] 01/12/2013, 4:59 PM  LOS: 10 days

## 2013-01-12 NOTE — Progress Notes (Signed)
PARENTERAL NUTRITION CONSULT NOTE - INITIAL  Pharmacy Consult for TPN Indication: Hx esophageal strictures, prolonged NPO status, intolerance to enteral feeds  Allergies  Allergen Reactions  . Codeine Other (See Comments)    Unknown.  [name] Hyomax (Hyoscyamine Sulfate) Other (See Comments)    unknown  . Other     Negative reactions to narcotics/opiates  . Oxycodone Hcl Er Other (See Comments)    unknown  . Sulfa Antibiotics Other (See Comments)    unknown    Patient Measurements: Height: 5\' 2"  (157.5 cm) (per daughter) Weight: 115 lb 1.3 oz (52.2 kg) IBW/kg (Calculated) : 50.1  Vital Signs: Temp: 98.3 F (36.8 C) (02/10 0425) Temp src: Oral (02/10 0425) BP: 95/44 mmHg (02/10 0700) Pulse Rate: 92 (02/10 0700) Intake/Output from previous day: 02/09 0701 - 02/10 0700 In: 3047.5 [I.V.:2000; IV Piggyback:300; TPN:147.5] Out: 701 [Urine:700; Stool:1] Intake/Output from this shift:    Labs:  Recent Labs  01/10/13 0520 01/12/13 0500  WBC 7.9 8.7  HGB 9.7* 9.7*  HCT 28.4* 27.6*  PLT 36* 72*     Recent Labs  01/10/13 0520 01/11/13 1520 01/12/13 0500  NA 140  --  140  K 3.6  --  2.9*  CL 113*  --  106  CO2 17*  --  25  GLUCOSE 100*  --  139*  BUN 61*  --  46*  CREATININE 1.46*  --  1.18*  CALCIUM 7.3*  --  6.9*  MG 1.7 1.6 1.5  PHOS 3.7  --  3.4  PROT 3.9*  --  3.6*  ALBUMIN 1.3*  --  1.2*  AST 36  --  28  ALT 22  --  21  ALKPHOS 62  --  47  BILITOT 0.1*  --  0.1*  PREALBUMIN 9.1*  --  7.5*  TRIG  --   --  48  CHOL  --   --  53   Estimated Creatinine Clearance: 27.1 ml/min (by C-G formula based on Cr of 1.18).    Recent Labs  01/11/13 2353 01/12/13 0421 01/12/13 0715  GLUCAP 117* 153* 140*    Medical History: Past Medical History  Diagnosis Date  . COPD (chronic obstructive pulmonary disease)   . Frequent UTI   . Hypertension   . Arthritis   . Osteoarthritis   . Hypercholesteremia   . Asthma   . Thyroid disease   . Environmental  allergies   . Osteopenia   . Macular degeneration   . Hearing loss   . Constipation   . Back pain   . Dementia   . PONV (postoperative nausea and vomiting)   . Hypothyroidism   . Blood transfusion     " no reaction to transfusion "  . Seizures     Medications:  Scheduled:  . antiseptic oral rinse  15 mL Mouth Rinse BID  . budesonide-formoterol  2 puff Inhalation BID  . chlorhexidine  15 mL Mouth Rinse BID  . furosemide  20 mg Intravenous Once  . [COMPLETED] furosemide  40 mg Intravenous Once  . Gerhardt's butt cream   Topical TID  . insulin aspart  0-9 Units Subcutaneous Q4H  . ipratropium  2 spray Each Nare BID  . levothyroxine  38 mcg Intravenous Daily  . metronidazole  500 mg Intravenous Q8H  . olopatadine  1 drop Both Eyes BID  . vancomycin (VANCOCIN) rectal ENEMA  500 mg Rectal Q6H   Infusions:  . TPN (CLINIMIX) +/- additives 30 mL/hr at  01/12/13 0036  . [DISCONTINUED]  sodium bicarbonate  infusion 1000 mL 125 mL/hr at 01/12/13 0036    Insulin Requirements in the past 24 hours:  4 units Novolog from sensitive SSI  Current Nutrition:  NPO Clinimix E 5/15 at 74ml/hr and Intralipid MWF (provides about 40% of goals)  Assessment: 79 y/o female patient admitted on 1/31 d/t diarrhea/ Cdiff colitis. Also noted hx of esophageal strictures. Pt failed her MBS and has essentially been NPO since admission. Determining plans for possible PEG placement. Palliative care consult.   GI: Admitted with Cdiff colitis, NPO since admission d/t failed MBS, hx esophageal strictures  Endo: No hx DM, hx hypothyroid (TSH 5.6) - serum CBGs <150 on SSI Lytes: Na 140, K 2.9, Phos 3.4, Mg 1.5, will replace, CaCo 9.1 Renal: Scr 1.18 (improving), UOP 0.53ml/kg/hr -  Lasix. I/O +734ml, tolerating TPN Pulm: 96% 3L [place] - symbicort Cards: Hx HTN, HLD - BP 99/49, HR 102 - no meds Hepatobil: LFTs WNL, prealbumin 7.5, Tbili low  Neuro: Hx dementia - A&O, palliative care involved ID: Vanc PR, flagyl  for Cdiff - afebrile, WBC WNL     Best Practices: SCDs TPN Access: CVC TPN day#: 1  Nutritional Goals:  1350-1550 kCal, 75-85 grams of protein per day, 1.4-1.6L  Plan:  1. Increase Clinimix E 5/15 to goal rate of 9ml/hr, which will provide 1398 kcal and 84g protein on daily average 2. Lipids, MVI, TE on MWF only d/t national shortage 3. F/u AM TPN labs 4.replace K with 6 runs and Mg with 1g  Verlene Mayer, PharmD, BCPS Pager [phone] 01/12/2013,10:25 AM

## 2013-01-12 NOTE — Progress Notes (Signed)
Upon assessment of flexiseal prior to administering vanc enema, noticed flexiseal was completely out and coiled underneath patient.  Small amount of liquid stool on pad.  Notified Dr. Lavera Guise, order received to leave flexi seal out for now.  Will continue to monitor pt output.

## 2013-01-12 NOTE — Progress Notes (Signed)
NT suction per MD order. Patient tolerated well. Mod amount of thick tan/white secretions.

## 2013-01-12 NOTE — Progress Notes (Signed)
Speech Language Pathology Dysphagia Treatment Patient Details Name: Shirley Bond MRN: [id number] DOB: 09-10-1924 Today's Date: 01/12/2013 Time: 4098-1191 SLP Time Calculation (min): 13 min  Assessment / Plan / Recommendation Clinical Impression  Treatment session to determine if mental status has improved for possible PO consumption. Pt continues to be minimally alert. SLP provided oral care with CHG with RN present. Pt with bloody dried secretions, thick wet secretions in posterior pharynx that pt is unable to clear and SLP cannot extract with oral care. RN reports she will try to get RT to NT suction pt. Pt did not exhibit any gag response with swab to posterior wall of oropharynx. With trials of ice chips, pt with minimal oral response, with oral spillage from right and immediate weak cough response. Pt is not ready for POs. Hopeful that mental status will improve in near future for PO intake. Discussed with dtr prior to session, she verbalizes understanding.     Diet Recommendation  Continue with Current Diet: NPO    SLP Plan Continue with current plan of care   Pertinent Vitals/Pain NA   Swallowing Goals  SLP Swallowing Goals Goal #3: Pt will demonstrate awareness of and sustain attention to puree trials for 1 minute with min verbal cues. Swallow Study Goal #3 - Progress: Progressing toward goal  General Temperature Spikes Noted: No Respiratory Status: Room air Behavior/Cognition: Lethargic;Doesn't follow directions;Distractible;Confused Oral Cavity - Dentition: Poor condition;Missing dentition Patient Positioning: Upright in bed  Oral Cavity - Oral Hygiene Does patient have any of the following "at risk" factors?: Lips - dry, cracked;Mucous Membranes - reddened;Saliva - thick, dry mouth;Nutritional status - inadequate;Oxygen therapy - cannula, mask, simple oxygen devices Patient is HIGH RISK - Oral Care Protocol followed (see row info): Yes   Dysphagia Treatment Treatment  focused on: Upgraded PO texture trials;Facilitation of oral preparatory phase;Utilization of compensatory strategies Family/Caregiver Educated: daughter Treatment Methods/Modalities: Skilled observation Patient observed directly with PO's: Yes Type of PO's observed: Ice chips Feeding: Total assist Liquids provided via: Teaspoon Oral Phase Signs & Symptoms: Anterior loss/spillage;Prolonged oral phase;Right pocketing Pharyngeal Phase Signs & Symptoms: Suspected delayed swallow initiation;Immediate cough Type of cueing: Verbal Amount of cueing: Maximal   GO    Harlon Ditty, MA CCC-SLP [phone] ] Aislinn Feliz, Riley Nearing 01/12/2013, 3:44 PM

## 2013-01-12 NOTE — Progress Notes (Signed)
NUTRITION FOLLOW UP  DOCUMENTATION CODES  Per approved criteria   -Severe malnutrition in the context of acute illness or injury    Intervention:    If EN started, recommend initiating Vital AF 1.2 formula at 15 ml/hr and increase by 10 ml ever 8 hours to goal rate of 45 ml/hr to provide 1296 total kcals, 81 gm protein, 876 ml of free water  TPN per pharmacy RD to follow for nutrition care plan  Nutrition Dx:   Inadequate oral intake related to decreased alertness and altered GI function as evidenced by NPO status, ongoing  Goal:   TPN to meet >90% of estimated protein needs, maximize energy provision as able during national lipid backorder, met  Monitor:   TPN prescription, PO diet advancement, weight, labs, I/O's  Assessment:   Palliative Care Team note reviewed 2/9.  GI note reviewed 2/9 ---> consideration of EGD with dilatation/IR to re-evaluate for tube placement for short-term enteral feedings.  If EN started, patient will be at refeeding risk with formula advancement given malnutrition.  Patient is receiving TPN with Clinimix E 5/15 @ 70 ml/hr.  Lipids (20% IVFE @ 10 ml/hr), multivitamins, and trace elements are provided 3 times weekly (MWF) due to national backorder.  Provides 1398 kcal and 84 grams protein daily (based on weekly average).  Meets 100% minimum estimated kcal and 100% minimum estimated protein needs.  Height: Ht Readings from Last 1 Encounters:  01/03/13 5\' 2"  (1.575 m)    Weight Status:   Wt Readings from Last 1 Encounters:  01/03/13 115 lb 1.3 oz (52.2 kg)    Re-estimated needs:  Kcal: 1350-1500 Protein: 75-85 gm Fluid: > 1.5 L  Skin: excoriated perineum   Diet Order: NPO   Intake/Output Summary (Last 24 hours) at 01/12/13 1114 Last data filed at 01/12/13 0831  Gross per 24 hour  Intake 2647.5 ml  Output    700 ml  Net 1947.5 ml    Last BM: 2/10  Labs:   Recent Labs Lab 01/09/13 0525 01/10/13 0520 01/11/13 1520 01/12/13 0500   NA 144 140  --  140  K 3.4* 3.6  --  2.9*  CL 114* 113*  --  106  CO2 18* 17*  --  25  BUN 70* 61*  --  46*  CREATININE 1.74* 1.46*  --  1.18*  CALCIUM 7.6* 7.3*  --  6.9*  MG  --  1.7 1.6 1.5  PHOS  --  3.7  --  3.4  GLUCOSE 161* 100*  --  139*    CBG (last 3)   Recent Labs  01/11/13 2353 01/12/13 0421 01/12/13 0715  GLUCAP 117* 153* 140*    Scheduled Meds: . antiseptic oral rinse  15 mL Mouth Rinse BID  . budesonide-formoterol  2 puff Inhalation BID  . chlorhexidine  15 mL Mouth Rinse BID  . Gerhardt's butt cream   Topical TID  . insulin aspart  0-9 Units Subcutaneous Q4H  . ipratropium  2 spray Each Nare BID  . levothyroxine  38 mcg Intravenous Daily  . magnesium sulfate 1 - 4 g bolus IVPB  1 g Intravenous Once  . metronidazole  500 mg Intravenous Q8H  . olopatadine  1 drop Both Eyes BID  . potassium chloride  10 mEq Intravenous Q1 Hr x 6  . vancomycin (VANCOCIN) rectal ENEMA  500 mg Rectal Q6H    Continuous Infusions: . TPN (CLINIMIX) +/- additives     And  . fat emulsion    .  TPN [facility]) +/- additives 30 mL/hr at 01/12/13 0036    Maureen Chatters, RD, LDN Pager #: [phone] After-Hours Pager #: [phone]

## 2013-01-12 NOTE — Progress Notes (Signed)
[facility] Progress Note  Subjective: The patient is still fairly obtunded not arousable to loud verbal stimuli  Objective: Vital signs in last 24 hours: Temp:  [98 F (36.7 Bond)-98.3 F (36.8 Bond)] 98.3 F (36.8 Bond) (02/10 0425) Pulse Rate:  [40-98] 92 (02/10 0700) Resp:  [18-21] 18 (02/10 0425) BP: (74-96)/(34-51) 95/44 mmHg (02/10 0700) SpO2:  [90 %-96 %] 96 % (02/10 0827) Weight change:    PE: Eyes closed does not appear to be any distress  Lab Results: Results for orders placed during the hospital encounter of 12/05/2012 (from the past 24 hour(s))  MAGNESIUM     Status: None   Collection Time    01/11/13  3:20 PM      Result Value Range   Magnesium 1.6  1.5 - 2.5 mg/dL  GLUCOSE, CAPILLARY     Status: Abnormal   Collection Time    01/11/13  3:56 PM      Result Value Range   Glucose-Capillary 119 (*) 70 - 99 mg/dL  GLUCOSE, CAPILLARY     Status: Abnormal   Collection Time    01/11/13  8:39 PM      Result Value Range   Glucose-Capillary 155 (*) 70 - 99 mg/dL   Comment 1 Notify RN    GLUCOSE, CAPILLARY     Status: Abnormal   Collection Time    01/11/13 11:53 PM      Result Value Range   Glucose-Capillary 117 (*) 70 - 99 mg/dL   Comment 1 Notify RN    GLUCOSE, CAPILLARY     Status: Abnormal   Collection Time    01/12/13  4:21 AM      Result Value Range   Glucose-Capillary 153 (*) 70 - 99 mg/dL   Comment 1 Notify RN    CBC     Status: Abnormal   Collection Time    01/12/13  5:00 AM      Result Value Range   WBC 8.7  4.0 - 10.5 K/uL   RBC 3.17 (*) 3.87 - 5.11 MIL/uL   Hemoglobin 9.7 (*) 12.0 - 15.0 g/dL   HCT 96.0 (*) 45.4 - 09.8 %   MCV 87.1  78.0 - 100.0 fL   MCH 30.6  26.0 - 34.0 pg   MCHC 35.1  30.0 - 36.0 g/dL   RDW 11.9 (*) 14.7 - 82.9 %   Platelets 72 (*) 150 - 400 K/uL  COMPREHENSIVE METABOLIC PANEL     Status: Abnormal   Collection Time    01/12/13  5:00 AM      Result Value Range   Sodium 140  135 - 145 mEq/L   Potassium 2.9 (*) 3.5 -  5.1 mEq/L   Chloride 106  96 - 112 mEq/L   CO2 25  19 - 32 mEq/L   Glucose, Bld 139 (*) 70 - 99 mg/dL   BUN 46 (*) 6 - 23 mg/dL   Creatinine, Ser 5.62 (*) 0.50 - 1.10 mg/dL   Calcium 6.9 (*) 8.4 - 10.5 mg/dL   Total Protein 3.6 (*) 6.0 - 8.3 g/dL   Albumin 1.2 (*) 3.5 - 5.2 g/dL   AST 28  0 - 37 U/L   ALT 21  0 - 35 U/L   Alkaline Phosphatase 47  39 - 117 U/L   Total Bilirubin 0.1 (*) 0.3 - 1.2 mg/dL   GFR calc non Af Amer 41 (*) >90 mL/min   GFR calc Af Amer 47 (*) >90 mL/min  MAGNESIUM     Status: None   Collection Time    01/12/13  5:00 AM      Result Value Range   Magnesium 1.5  1.5 - 2.5 mg/dL  PHOSPHORUS     Status: None   Collection Time    01/12/13  5:00 AM      Result Value Range   Phosphorus 3.4  2.3 - 4.6 mg/dL  DIFFERENTIAL     Status: Abnormal   Collection Time    01/12/13  5:00 AM      Result Value Range   Neutrophils Relative 85 (*) 43 - 77 %   Lymphocytes Relative 9 (*) 12 - 46 %   Monocytes Relative 5  3 - 12 %   Eosinophils Relative 1  0 - 5 %   Basophils Relative 0  0 - 1 %   Neutro Abs 7.4  1.7 - 7.7 K/uL   Lymphs Abs 0.8  0.7 - 4.0 K/uL   Monocytes Absolute 0.4  0.1 - 1.0 K/uL   Eosinophils Absolute 0.1  0.0 - 0.7 K/uL   Basophils Absolute 0.0  0.0 - 0.1 K/uL   WBC Morphology TOXIC GRANULATION    PREALBUMIN     Status: Abnormal   Collection Time    01/12/13  5:00 AM      Result Value Range   Prealbumin 7.5 (*) 17.0 - 34.0 mg/dL  CHOLESTEROL, TOTAL     Status: None   Collection Time    01/12/13  5:00 AM      Result Value Range   Cholesterol 53  0 - 200 mg/dL  TRIGLYCERIDES     Status: None   Collection Time    01/12/13  5:00 AM      Result Value Range   Triglycerides 48  <150 mg/dL  GLUCOSE, CAPILLARY     Status: Abnormal   Collection Time    01/12/13  7:15 AM      Result Value Range   Glucose-Capillary 140 (*) 70 - 99 mg/dL  GLUCOSE, CAPILLARY     Status: Abnormal   Collection Time    01/12/13 11:16 AM      Result Value Range    Glucose-Capillary 130 (*) 70 - 99 mg/dL    Studies/Results: US Abdomen Complete  01/10/2013  *RADIOLOGY REPORT*  Clinical Data:  Evaluate this  COMPLETE ABDOMINAL ULTRASOUND  Comparison:  These renal ultrasound 01/03/2013  Findings:  Gallbladder:  The gallbladder is mildly thick-walled at 4 mm.  This may relate to ascites with passive edema.  No echogenic gallstones evident.  Murphy's sign was not evaluable due to patient in a non responsive state.  Common bile duct:  Normal at 5 mm  Liver:  The liver is mildly lobular in contour.  No duct dilatation.  There is a moderate volume ascites surrounding the liver margin  IVC:  Appears normal.  Pancreas:  No focal abnormality seen.  Spleen:  Normal in size and echogenicity.  Right Kidney:  8.0cm in length.  There is cortical thinning.  There is a 1.3 cm anechoic cyst within the upper pole with a thin septation.  Left Kidney:  11.1cm in length.  Cortical thinning.  Abdominal aorta:  No aneurysm identified.  IMPRESSION:  1.  Moderate volume ascites and bilateral pleural effusions. 2.  Lobular liver. 3.  Gallbladder wall thickening likely secondary to ascites. 4.  Bilateral renal cortical thinning. 5.  Probable benign cyst in the upper pole of the right kidney.  Original Report Authenticated By: Genevive Bi, M.D.       Assessment: Nutritional deficiencies and awake of sepsis and renal failure in an elderly patient Bond. difficile colitis.  Plan: Is considering reconsulted radiology for attempted placement of nasoenteric feeding tube. However the patient has since been started on TPN which is reasonable. If if her mental status improves may be reasonable to repeat a speech pathology evaluation at some point to see if/ when by mouth nutrition might be allowable. If she improves to that point could consider EGD based on history of esophageal dilatations but I doubt an esophageal stricture as the limiting factor in her taking oral nutrition. She has some ascites  which would be a relative contraindication to PEG. Perhaps this will improve with improvement nutrition.  Shirley Bond 01/12/2013, 12:11 PM

## 2013-01-12 NOTE — Progress Notes (Signed)
Subjective: Patient seen, mumbling. Not responding to verbal commands. Filed Vitals:   01/12/13 0700  BP: 95/44  Pulse: 92  Temp:   Resp:     Chest: Rhonchi bilaterally Heart : S1S2 RRR Abdomen: Soft, nontender Ext : No edema Neuro: Alert, not oriented x 3  A/P  Patient Active Problem List  Diagnosis  . Cerebral hemorrhage  . HTN (hypertension)  . Osteoarthritis  . COPD (chronic obstructive pulmonary disease)  . UTI (urinary tract infection), bacterial  . ARF (acute renal failure)  . Dehydration  . Diarrhea  . Hypotension  . Rash of groin  . Hx of scabies  . Encephalopathy acute  . C. difficile colitis  . Severe sepsis with septic shock  . Leukocytosis  . Thrombocytopenia, unspecified   Dysphagia  GI has evaluated and plan for possible EGD if patient becomes stable Continue TPN  Pulmonary congestion Patient is on IV fluids at 125 ml/hr Consider d/cing the iv fluis   Will assess the patient over next few days, if no improvement then  We would need re goals of care with family. Has discussed that with patient's daughter in the initial Goals of care meeting.  Meredeth Ide [facility] [phone]

## 2013-01-12 NOTE — Progress Notes (Signed)
Pt has transferred to unit 3W. This CSW has informed new unit CSW who will facilitate with dc plans. This CSW signing off.  Theresia Bough, MSW, Theresia Majors [phone]

## 2013-01-13 LAB — HEPARIN INDUCED THROMBOCYTOPENIA PNL
Heparin Induced Plt Ab: NEGATIVE
UFH Low Dose 0.1 IU/mL: 16 % Release
UFH SRA Result: NEGATIVE

## 2013-01-13 LAB — GLUCOSE, CAPILLARY
Glucose-Capillary: 114 mg/dL — ABNORMAL HIGH (ref 70–99)
Glucose-Capillary: 137 mg/dL — ABNORMAL HIGH (ref 70–99)
Glucose-Capillary: 152 mg/dL — ABNORMAL HIGH (ref 70–99)
Glucose-Capillary: 168 mg/dL — ABNORMAL HIGH (ref 70–99)

## 2013-01-13 LAB — COMPREHENSIVE METABOLIC PANEL
ALT: 20 U/L (ref 0–35)
Calcium: 7.1 mg/dL — ABNORMAL LOW (ref 8.4–10.5)
GFR calc Af Amer: 54 mL/min — ABNORMAL LOW (ref >90)
Glucose, Bld: 140 mg/dL — ABNORMAL HIGH (ref 70–99)
Sodium: 139 mEq/L (ref 135–145)
Total Protein: 3.7 g/dL — ABNORMAL LOW (ref 6.0–8.3)

## 2013-01-13 MED ORDER — IPRATROPIUM BROMIDE 0.02 % IN SOLN
0.5000 mg | Freq: Four times a day (QID) | RESPIRATORY_TRACT | Status: DC
  Administered 2013-01-13 – 2013-01-16 (×13): 0.5 mg via RESPIRATORY_TRACT
  Filled 2013-01-13 (×13): qty 2.5

## 2013-01-13 MED ORDER — SODIUM CHLORIDE 0.9 % IJ SOLN
10.0000 mL | INTRAMUSCULAR | Status: DC | PRN
  Administered 2013-01-18: 10 mL
  Administered 2013-01-19: 20 mL
  Administered 2013-01-21: 10 mL

## 2013-01-13 MED ORDER — CLINIMIX E/DEXTROSE (5/15) 5 % IV SOLN
INTRAVENOUS | Status: AC
  Administered 2013-01-13: 17:00:00 via INTRAVENOUS
  Filled 2013-01-13: qty 2000

## 2013-01-13 MED ORDER — ALBUTEROL SULFATE (5 MG/ML) 0.5% IN NEBU
2.5000 mg | INHALATION_SOLUTION | Freq: Four times a day (QID) | RESPIRATORY_TRACT | Status: DC
  Administered 2013-01-13 – 2013-01-21 (×31): 2.5 mg via RESPIRATORY_TRACT
  Filled 2013-01-13 (×31): qty 0.5

## 2013-01-13 MED ORDER — ACETAMINOPHEN 10 MG/ML IV SOLN
1000.0000 mg | Freq: Four times a day (QID) | INTRAVENOUS | Status: AC | PRN
  Administered 2013-01-13 – 2013-01-14 (×2): 1000 mg via INTRAVENOUS
  Filled 2013-01-13 (×3): qty 100

## 2013-01-13 MED ORDER — ACETYLCYSTEINE 20 % IN SOLN
3.0000 mL | Freq: Four times a day (QID) | RESPIRATORY_TRACT | Status: DC
  Administered 2013-01-13 – 2013-01-21 (×25): 3 mL via RESPIRATORY_TRACT
  Filled 2013-01-13 (×42): qty 4

## 2013-01-13 MED ORDER — FUROSEMIDE 10 MG/ML IJ SOLN
40.0000 mg | Freq: Once | INTRAMUSCULAR | Status: AC
  Administered 2013-01-13: 40 mg via INTRAVENOUS
  Filled 2013-01-13: qty 4

## 2013-01-13 NOTE — Progress Notes (Signed)
01/13/2013 0845 Patient naso-tracheal suctioned.  Pt oxygened pior to procedure.  Catheter lubricated prior to insertion.  Suctioned thru left nare with no complications.  Moderate amt of thick tan secretions obtained.

## 2013-01-13 NOTE — Progress Notes (Signed)
[facility] PROGRESS NOTE  Shirley Bond ZOX:[id number] DOB: 05-Sep-1924 DOA: 12/23/2012 PCP: Terald Sleeper, MD  Brief narrative:  79 y/o female who was recenrtly treated for UTI followed by  diarrhea for almost a week with poor po intake.Her mental status declined and she had been becoming progressively more confused and ultimately minimally responsive. In ER she was found to be dehydrated with Cr of 5.3 and WBC of 21.7.  Of note she has a hx of esophageal strictures and gets dilatations done about once a year by Dr Chales Abrahams at [place]. Her last EGD was in June 2013.[name]   After her admission she was noted to be in septic shock from C.diff. PCCM was consulted on 2/2 and assumed care of the pt until 01/07/2013 and transferred to step down.   Assessment/Plan:  Septic shock  Due to C diff colitis .  Patient was admitted to ICU and treated with abx and iv fluids, CVC was placed on 2/2. Patient was placed on early goal directed therapy protocol and she required pressors briefly.     C diff colitis  Patient was placed on iv flagyl and po vanc on admission .  We did add vancomycin enema on 2/6  Acute kidney injury due to ATN from sepsis  Patient was placed on iv fluids with improvement of renal function  Peak creatinine was 5.44 on 01/03/13 -  Creatinine down to 1 on 01/13/13  Decrease urine out put. No evidence of bladder obstruction on [place]. CT shows ascites and some pleural effusion. Patient has received   IV lasix on 2/9, 2/10, 2/11  Discontinued IV fluids on 01/12/13  Hypotension  Cortisol checked on 2/2 - and it was adequately elevated - no suspicion for adrenal insufficiency    Massive anasarca  After fluid resuscitation in a patient with very low albumin level - 1.2   Hypernatremia  Due to hypovolemia as well as true DH/free water deficit . Resolved with iv fluids.  Ongoing monitoring on TPN  Hypokalemia and Hypocalcemia and Hypomagnesemia  Currently controlled with  TPN   Metabolic acidosis  from bicarb loss w/ diarrhea, acute renal failure, and NS resuscitation efforts  Currently controlled with TPN    Thrombocytopnia  Likely due to acute infection - - continue to avoid heparin products    - no spontaneous blood loss at this time  - improving    Acute encephalopathy  Slowly improving - likely due to toxic metabolic encephalopathy  in setting of severe infection and acute renal failure .  Mildly elevated TSH  Possibly related to infection. Synthroid dose increased this admission. Monitor as outpatient  Reported hx of esophageal strictures  GI has seen - no plans for endo in setting of active GI infection - unfortunately due to the results of the modified barium swallow we are unable to give her clear liquids..  Gets esophageal dilatation by Dr Chales Abrahams in [place] once a year.   Code Status: DNR after palliative care discussion with family on 2/9   Family Communication: daughter at bedside. Disposition Plan: currently inpatient Consultants:  PCCM  [facility]   Procedures:  2/2 CVL L IJ >>  2/4 TTE - poor acustic windows - no useful information obtained  Antibiotics:  2/1 vanc >>  2/1 flagyl >>   DVT prophylaxis:  SCDs   HPI/Subjective:  Tired and congested -whispers   Objective: Filed Vitals:   01/12/13 0827 01/12/13 1350 01/12/13 1950 01/13/13 0408  BP:  92/54 102/54  110/47  Pulse:  91 89 88  Temp:  98.2 F (36.8 C) 98.7 F (37.1 C) 98.1 F (36.7 C)  TempSrc:  Oral Oral Oral  Resp:  20 20 20   Height:      Weight:      SpO2: 96% 96% 92% 95%   Patient Vitals for the past 24 hrs:  BP Temp Temp src Pulse Resp SpO2  01/13/13 0408 110/47 mmHg 98.1 F (36.7 C) Oral 88 20 95 %  01/12/13 1950 102/54 mmHg 98.7 F (37.1 C) Oral 89 20 92 %  01/12/13 1350 92/54 mmHg 98.2 F (36.8 C) Oral 91 20 96 %     Intake/Output Summary (Last 24 hours) at 01/13/13 0851 Last data filed at 01/13/13 6962  Gross per 24  hour  Intake      0 ml  Output    800 ml  Net   -800 ml   Filed Weights   01/03/13 0414 01/03/13 0653  Weight: 49.8 kg (109 lb 12.6 oz) 52.2 kg (115 lb 1.3 oz)    Exam:  General: elderly frail female in NAD Lungs: Bilateral ronchus, crackles  Cardiovascular: Regular rate and rhythm without murmur gallop or rub  Abdomen: mildly distended, bowel sounds positive, no rebound, no ascites, no appreciable mass  Extremities: bilateral edema -     Data Reviewed: Basic Metabolic Panel:  Recent Labs Lab 01/08/13 0500 01/09/13 0525 01/10/13 0520 01/11/13 1520 01/12/13 0500 01/13/13 0500  NA 150* 144 140  --  140 139  K 3.9 3.4* 3.6  --  2.9* 3.5  CL 119* 114* 113*  --  106 107  CO2 18* 18* 17*  --  25 26  GLUCOSE 82 161* 100*  --  139* 140*  BUN 75* 70* 61*  --  46* 45*  CREATININE 1.96* 1.74* 1.46*  --  1.18* 1.06  CALCIUM 7.6* 7.6* 7.3*  --  6.9* 7.1*  MG  --   --  1.7 1.6 1.5  --   PHOS  --   --  3.7  --  3.4  --    Liver Function Tests:  Recent Labs Lab 01/08/13 0500 01/10/13 0520 01/12/13 0500 01/13/13 0500  AST 55* 36 28 27  ALT 24 22 21 20   ALKPHOS 68 62 47 49  BILITOT 0.1* 0.1* 0.1* 0.1*  PROT 4.1* 3.9* 3.6* 3.7*  ALBUMIN 1.4* 1.3* 1.2* 1.2*   No results found for this basename: LIPASE, AMYLASE,  in the last 168 hours No results found for this basename: AMMONIA,  in the last 168 hours CBC:  Recent Labs Lab 01/07/13 0430 01/08/13 0500 01/09/13 0525 01/10/13 0520 01/12/13 0500  WBC 8.7 7.5 6.8 7.9 8.7  NEUTROABS 7.0  --   --   --  7.4  HGB 10.6* 10.1* 9.6* 9.7* 9.7*  HCT 30.8* 30.2* 27.8* 28.4* 27.6*  MCV 87.7 89.3 89.1 89.6 87.1  PLT 48* 28* 25* 36* 72*   Cardiac Enzymes: No results found for this basename: CKTOTAL, CKMB, CKMBINDEX, TROPONINI,  in the last 168 hours BNP (last 3 results) No results found for this basename: PROBNP,  in the last 8760 hours CBG:  Recent Labs Lab 01/12/13 1545 01/12/13 1719 01/12/13 1948 01/12/13 2351  01/13/13 0405  GLUCAP 115* 114* 142* 152* 114*    Recent Results (from the past 240 hour(s))  CLOSTRIDIUM DIFFICILE BY PCR     Status: Abnormal   Collection Time    01/03/13 10:37 AM  Result Value Range Status   C difficile by pcr POSITIVE (*) NEGATIVE Final   Comment: CRITICAL RESULT CALLED TO, READ BACK BY AND VERIFIED WITH:     J.DAVIS,RN 1610 01/03/13 EHOWARD  STOOL CULTURE     Status: None   Collection Time    01/03/13 10:38 AM      Result Value Range Status   Specimen Description STOOL   Final   Special Requests NONE   Final   Culture     Final   Value: NO SALMONELLA, SHIGELLA, CAMPYLOBACTER, YERSINIA, OR E.COLI 0157:H7 ISOLATED   Report Status 01/07/2013 FINAL   Final  CULTURE, BLOOD (ROUTINE X 2)     Status: None   Collection Time    01/04/13  1:50 PM      Result Value Range Status   Specimen Description BLOOD RIGHT ARM   Final   Special Requests BOTTLES DRAWN AEROBIC AND ANAEROBIC 10CC   Final   Culture  Setup Time 01/05/2013 15:28   Final   Culture NO GROWTH 5 DAYS   Final   Report Status 01/11/2013 FINAL   Final  CULTURE, BLOOD (ROUTINE X 2)     Status: None   Collection Time    01/04/13  2:00 PM      Result Value Range Status   Specimen Description BLOOD RIGHT ARM   Final   Special Requests BOTTLES DRAWN AEROBIC ONLY 10CC   Final   Culture  Setup Time 01/05/2013 15:27   Final   Culture NO GROWTH 5 DAYS   Final   Report Status 01/11/2013 FINAL   Final     Studies: No results found.  Scheduled Meds: . antiseptic oral rinse  15 mL Mouth Rinse BID  . budesonide-formoterol  2 puff Inhalation BID  . chlorhexidine  15 mL Mouth Rinse BID  . Gerhardt's butt cream   Topical TID  . insulin aspart  0-9 Units Subcutaneous Q4H  . ipratropium  2 spray Each Nare BID  . levothyroxine  38 mcg Intravenous Daily  . metronidazole  500 mg Intravenous Q8H  . olopatadine  1 drop Both Eyes BID  . vancomycin (VANCOCIN) rectal ENEMA  500 mg Rectal Q6H   Continuous  Infusions: . TPN (CLINIMIX) +/- additives 70 mL/hr at 01/12/13 1708   And  . fat emulsion 250 mL (01/12/13 1708)        Shirley Bond  [facility] Pager [phone]. If 7PM-7AM, please contact night-coverage at [email or web address], password [facility] 01/13/2013, 8:51 AM  LOS: 11 days

## 2013-01-13 NOTE — Progress Notes (Signed)
Subjective: Patient seen, more alert, communicating. TNA started for nutrition. Filed Vitals:   01/13/13 0408  BP: 110/47  Pulse: 88  Temp: 98.1 F (36.7 C)  Resp: 20    Chest: Rhonchi bilaterally Heart : S1S2 RRR Abdomen: Soft, nontender Ext : No edema Neuro: Alert, not oriented x 3  A/P  Patient Active Problem List  Diagnosis  . Cerebral hemorrhage  . HTN (hypertension)  . Osteoarthritis  . COPD (chronic obstructive pulmonary disease)  . UTI (urinary tract infection), bacterial  . ARF (acute renal failure)  . Dehydration  . Diarrhea  . Hypotension  . Rash of groin  . Hx of scabies  . Encephalopathy acute  . C. difficile colitis  . Severe sepsis with septic shock  . Leukocytosis  . Thrombocytopenia, unspecified   Dysphagia  GI has evaluated and plan for possible EGD if patient becomes stable Continue TPN  Pulmonary congestion Improved Continue NT suction Will add Mucomyst to help thinning out phlegm  Will assess the patient over next few days, if no improvement then  We would need re goals of care with family. Has discussed that with patient's daughter in the initial Goals of care meeting.  Meredeth Ide [facility] [phone]

## 2013-01-13 NOTE — Progress Notes (Signed)
Speech Language Pathology Dysphagia Treatment Patient Details Name: Shirley Bond MRN: [id number] DOB: [birth year] Today's Date: 01/13/2013 Time: 1191-4782 SLP Time Calculation (min): 21 min  Assessment / Plan / Recommendation Clinical Impression  Treatment session again focused on skilled observation of pts arousal, ability to manipulate and swallow POs for possiblity of PO diet. Pt more alert this pm, eyes open following commands. SLP again completed oral care, dried secretions on palate removed. Encourage oral care with pt and daughter. Pt often resistant to staff attempts, but oral care is necessary with this pt if any improvement in function is to be made. Instructed pts daughter in applying Biotene jelly to lips and tongue for healing.   SLP provided trials of ice chips and puree with improved function though overt signs of aspiration persist with ice chips. Pt now able to follow commands to cough, swallow twice. Suspect continued pharygneal residuals post swallow. If pt sustains this level of alertness through tomorrow, may consider repeat MBS. Will recheck in am tomorrow.     Diet Recommendation  Continue with Current Diet: NPO    SLP Plan Continue with current plan of care   Pertinent Vitals/Pain NA   Swallowing Goals  SLP Swallowing Goals Goal #3: Pt will demonstrate awareness of and sustain attention to puree trials for 1 minute with min verbal cues. Swallow Study Goal #3 - Progress: Progressing toward goal Goal #4: Pt will follow commands to swallow puree trials twice/clear throat as needed with min verbal cues.  Swallow Study Goal #4 - Progress: Progressing toward goal  General Temperature Spikes Noted: No Respiratory Status: Supplemental O2 delivered via (comment) Behavior/Cognition: Distractible;Alert;Requires cueing Oral Cavity - Dentition: Poor condition;Missing dentition Patient Positioning: Upright in bed  Oral Cavity - Oral Hygiene Does patient have any of the  following "at risk" factors?: Lips - dry, cracked;Mucous Membranes - reddened;Saliva - thick, dry mouth;Nutritional status - inadequate;Oxygen therapy - cannula, mask, simple oxygen devices Patient is HIGH RISK - Oral Care Protocol followed (see row info): Yes   Dysphagia Treatment Treatment focused on: Upgraded PO texture trials;Patient/family/caregiver education;Facilitation of oral phase;Facilitation of pharyngeal phase Family/Caregiver Educated: daughter Treatment Methods/Modalities: Skilled observation Patient observed directly with PO's: Yes Type of PO's observed: Ice chips;Dysphagia 1 (puree) Feeding: Total assist Liquids provided via: Teaspoon Pharyngeal Phase Signs & Symptoms: Multiple swallows;Wet vocal quality;Immediate cough Type of cueing: Verbal Amount of cueing: Moderate   GO    Harlon Ditty, [place] CCC-SLP [phone]  [name] 01/13/2013, 3:55 PM

## 2013-01-13 NOTE — Progress Notes (Signed)
PARENTERAL NUTRITION CONSULT NOTE - INITIAL  Pharmacy Consult for TPN Indication: Hx esophageal strictures, prolonged NPO status, intolerance to enteral feeds  Allergies  Allergen Reactions  . Codeine Other (See Comments)    Unknown.  [name] Hyomax (Hyoscyamine Sulfate) Other (See Comments)    unknown  . Other     Negative reactions to narcotics/opiates  . Oxycodone Hcl Er Other (See Comments)    unknown  . Sulfa Antibiotics Other (See Comments)    unknown    Patient Measurements: Height: 5\' 2"  (157.5 cm) (per daughter) Weight: 115 lb 1.3 oz (52.2 kg) IBW/kg (Calculated) : 50.1  Vital Signs: Temp: 98.1 F (36.7 C) (02/11 0408) Temp src: Oral (02/11 0408) BP: 110/47 mmHg (02/11 0408) Pulse Rate: 88 (02/11 0408) Intake/Output from previous day: 02/10 0701 - 02/11 0700 In: 0  Out: 800 [Urine:700; Stool:100] Intake/Output from this shift:    Labs:  Recent Labs  01/12/13 0500  WBC 8.7  HGB 9.7*  HCT 27.6*  PLT 72*     Recent Labs  01/11/13 1520 01/12/13 0500 01/13/13 0500  NA  --  140 139  K  --  2.9* 3.5  CL  --  106 107  CO2  --  25 26  GLUCOSE  --  139* 140*  BUN  --  46* 45*  CREATININE  --  1.18* 1.06  CALCIUM  --  6.9* 7.1*  MG 1.6 1.5  --   PHOS  --  3.4  --   PROT  --  3.6* 3.7*  ALBUMIN  --  1.2* 1.2*  AST  --  28 27  ALT  --  21 20  ALKPHOS  --  47 49  BILITOT  --  0.1* 0.1*  PREALBUMIN  --  7.5*  --   TRIG  --  48  --   CHOL  --  53  --    Estimated Creatinine Clearance: 30.1 ml/min (by C-G formula based on Cr of 1.06).    Recent Labs  01/13/13 0405 01/13/13 0723 01/13/13 1114  GLUCAP 114* 137* 152*    Medical History: Past Medical History  Diagnosis Date  . COPD (chronic obstructive pulmonary disease)   . Frequent UTI   . Hypertension   . Arthritis   . Osteoarthritis   . Hypercholesteremia   . Asthma   . Thyroid disease   . Environmental allergies   . Osteopenia   . Macular degeneration   . Hearing loss   .  Constipation   . Back pain   . Dementia   . PONV (postoperative nausea and vomiting)   . Hypothyroidism   . Blood transfusion     " no reaction to transfusion "  . Seizures     Medications:  Scheduled:  . [COMPLETED] acetaminophen  1,000 mg Intravenous Once  . acetylcysteine  3 mL Nebulization Q6H  . albuterol  2.5 mg Nebulization Q6H  . antiseptic oral rinse  15 mL Mouth Rinse BID  . budesonide-formoterol  2 puff Inhalation BID  . chlorhexidine  15 mL Mouth Rinse BID  . furosemide  40 mg Intravenous Once  . Gerhardt's butt cream   Topical TID  . insulin aspart  0-9 Units Subcutaneous Q4H  . ipratropium  2 spray Each Nare BID  . ipratropium  0.5 mg Nebulization Q6H  . levothyroxine  38 mcg Intravenous Daily  . metronidazole  500 mg Intravenous Q8H  . olopatadine  1 drop Both Eyes BID  . [  COMPLETED] potassium chloride  10 mEq Intravenous Q1 Hr x 6  . vancomycin (VANCOCIN) rectal ENEMA  500 mg Rectal Q6H   Infusions:  . TPN (CLINIMIX) +/- additives 70 mL/hr at 01/12/13 1708   And  . fat emulsion 250 mL (01/12/13 1708)  . [EXPIRED] TPN (CLINIMIX) +/- additives 30 mL/hr at 01/12/13 0036    Insulin Requirements in the past 24 hours:  4 units Novolog from sensitive SSI since last TPN hung  Nutritional Goals:  1350-1550 kCal, 75-85 grams of protein per day, 1.4-1.6L  Current Nutrition:  Clinimix E 5/15 at 64ml/hr and Intralipid MWF=averages 1398 kcal and 84g protein weekly with lipids only MWF  Assessment: 79 y/o female patient admitted on 1/31 with Cdiff colitis. Also noted hx of esophageal strictures. Pt failed her MBS and has essentially been NPO since admission. Determining plans for possible PEG placement. Palliative care consult.   GI: Admitted with Cdiff colitis, NPO since admission d/t failed MBS, hx esophageal strictures. Endo: No hx DM, hx hypothyroid (TSH 5.6) on levothyroxine- CBGs 114-152 on SSI Lytes: Na 140, K replaced to 3.5 Renal: Scr 1.06 (improving), UOP  0.32ml/kg/hr. I/O -800. Pulm: 95% 3L [place] - symbicort, albuterol/atroven, Mucomyst nebx Cards: Hx HTN, HLD -110/47, HR 88 on no cardiac meds. Hepatobil: LFTs WNL, prealbumin 7.5 up to 9.1, Tbili low 0.1 Neuro: Hx dementia - A&O, palliative care involved. Alert and communicative. ID: Vanc PR, flagyl for Cdiff - afebrile, WBC 8.7 Best Practices: SCDs, MC TPN Access: CVC Triple Lumen   Plan:  1. Clinimix E 5/15 at goal rate of 83ml/hr, which will provide 1398 kcal and 84g protein on daily average 2. Lipids, MVI, TE on MWF only d/t Geographical information systems officer S. Merilynn Finland, PharmD, BCPS Clinical Staff Pharmacist Pager [phone]  01/13/2013,11:57 AM

## 2013-01-13 NOTE — Progress Notes (Signed)
01/13/2013 1140. Patient suctioned nasotracheal. Patient hyperoxygenated prior to suction. Lubricant added to catheter  Prior to suction.Moderate amts of thin tan secrtions obtained.

## 2013-01-14 ENCOUNTER — Inpatient Hospital Stay (HOSPITAL_COMMUNITY): Payer: Medicare Other

## 2013-01-14 LAB — BASIC METABOLIC PANEL
Calcium: 7.2 mg/dL — ABNORMAL LOW (ref 8.4–10.5)
GFR calc Af Amer: 60 mL/min — ABNORMAL LOW (ref >90)
GFR calc non Af Amer: 51 mL/min — ABNORMAL LOW (ref >90)
Glucose, Bld: 156 mg/dL — ABNORMAL HIGH (ref 70–99)
Sodium: 138 mEq/L (ref 135–145)

## 2013-01-14 LAB — CBC
HCT: 26.6 % — ABNORMAL LOW (ref 36.0–46.0)
MCHC: 34.2 g/dL (ref 30.0–36.0)
RDW: 17 % — ABNORMAL HIGH (ref 11.5–15.5)

## 2013-01-14 LAB — GLUCOSE, CAPILLARY: Glucose-Capillary: 139 mg/dL — ABNORMAL HIGH (ref 70–99)

## 2013-01-14 MED ORDER — SODIUM CHLORIDE 0.9 % IR SOLN
500.0000 mg | Freq: Three times a day (TID) | Status: DC
  Administered 2013-01-14 – 2013-01-15 (×3): 500 mg via RECTAL
  Filled 2013-01-14: qty 500

## 2013-01-14 MED ORDER — RESOURCE THICKENUP CLEAR PO POWD
ORAL | Status: DC | PRN
  Filled 2013-01-14: qty 125

## 2013-01-14 MED ORDER — FAT EMULSION 20 % IV EMUL
240.0000 mL | INTRAVENOUS | Status: AC
  Administered 2013-01-14: 240 mL via INTRAVENOUS
  Filled 2013-01-14 (×2): qty 250

## 2013-01-14 MED ORDER — FUROSEMIDE 10 MG/ML IJ SOLN
40.0000 mg | Freq: Once | INTRAMUSCULAR | Status: AC
  Administered 2013-01-14: 40 mg via INTRAVENOUS
  Filled 2013-01-14: qty 4

## 2013-01-14 MED ORDER — TRACE MINERALS CR-CU-F-FE-I-MN-MO-SE-ZN IV SOLN
INTRAVENOUS | Status: AC
  Administered 2013-01-14: 18:00:00 via INTRAVENOUS
  Filled 2013-01-14: qty 2000

## 2013-01-14 NOTE — Procedures (Signed)
Objective Swallowing Evaluation: Modified Barium Swallowing Study  Patient Details  Name: Shirley Bond MRN: [id number] Date of Birth: 1924/08/11  Today's Date: 01/14/2013 Time: 0865-7846 SLP Time Calculation (min): 25 min  Past Medical History:  Past Medical History  Diagnosis Date  . COPD (chronic obstructive pulmonary disease)   . Frequent UTI   . Hypertension   . Arthritis   . Osteoarthritis   . Hypercholesteremia   . Asthma   . Thyroid disease   . Environmental allergies   . Osteopenia   . Macular degeneration   . Hearing loss   . Constipation   . Back pain   . Dementia   . PONV (postoperative nausea and vomiting)   . Hypothyroidism   . Blood transfusion     " no reaction to transfusion "  . Seizures    Past Surgical History:  Past Surgical History  Procedure Laterality Date  . Kyphosis surgery    . Spinal fusion    . Rotator cuff repair      unsuccessful x 2  . Bladder surgery      with mesh & vaginal wall   HPI:  Shirley Bond is an 79 y.o. female with past medical history of COPD (chronic obstructive pulmonary disease); Frequent UTI; Hypertension; Arthritis; Osteoarthritis; Hypercholesteremia; Asthma; Thyroid disease; Environmental allergies; Osteopenia; Macular degeneration; Hearing loss; Constipation; Back pain; Dementia; PONV (postoperative nausea and vomiting); Hypothyroidism; Blood transfusion; and Seizures. Presented with 2 week hx of feeling poorly at first she was diagnosed with UTI and was treated with IM antibiotics. For 1 week she developed diarrhea with frequent episodes. She has not been eating anything for the past 5 days. Her mental status has declined and she has been becoming progressively more confused. In ER she was found to be dehydrated with Cr of 5.3 and WBC of 21.7. Hospitalist service to admit for IV rehydration and diarrhea work up. Of note she has hx of esophageal stricture and gets dilatations done about once a year. Her last one  was about 1 year ago.  Patient had inital MBS, made NPO with sustained lethargy. Now of TPN, more alert. Repeat MBS for readiness to start diet .      Assessment / Plan / Recommendation Clinical Impression  Dysphagia Diagnosis: Moderate pharyngeal phase dysphagia Clinical impression: Pt presents with similar oropharyngeal function as seen in previous MBS, though mentation and alertness have improved, allowing pt to follow commands for precautions and strategies.   The pt continues to demonstrate moderate to severe motor deficits due to weakness, decompensation. The pts oral and likely oropharyngeal mucosa are severely dry. Base of tongue retraction and epiglottic deflection are impaired, resulting in severe vallecular residuals with honey thick liquids and puree. With verbal cues today, the pt was able to swallow 2-3 times with each bite, clearing 75% of residue. She also attempted a chin tuck although she could not keep her chin down long enough to initiate swallow in that position. This may be a helpful strategy to try at the bedside. Other liquid consistencies were not tested as aspiration with similar fucntion was observed in preceeding MBS.   At this time, recommend pt initate a puree (dys 1) diet with pudding thick liquids. Given decompensation and severe dryness of mucosa, function likely to return only with usage of the swallow mechanism. With precautions, pt may tolerate restrictive diet and improve function while still recieiving TNA as primay source of nutriton. Hopeful for upgrade with clinical evidence of improvement.  Instructions for PO consumption: Full supervision, oral care before and after POs, small teaspoon bites of puree, verbal cues to swallow 2-3 times. Provide small snacks of puree frequently throughout the day. Do not try to feed pt full meals as she is at risk for build up of pharyngeal residual and fatigue.     Treatment Recommendation  Therapy as outlined in treatment  plan below    Diet Recommendation Dysphagia 1 (Puree);Pudding-thick liquid   Liquid Administration via: Spoon Medication Administration: Via alternative means Supervision: Full supervision/cueing for compensatory strategies;Staff feed patient Compensations: Slow rate;Small sips/bites;Multiple dry swallows after each bite/sip;Effortful swallow Postural Changes and/or Swallow Maneuvers: Seated upright 90 degrees;Upright 30-60 min after meal    Other  Recommendations Oral Care Recommendations: Oral care before and after PO Other Recommendations: Order thickener from pharmacy;Prohibited food (jello, ice cream, thin soups);Have oral suction available   Follow Up Recommendations  Skilled Nursing facility    Frequency and Duration min 3x week  2 weeks   Pertinent Vitals/Pain NA    SLP Swallow Goals Patient will utilize recommended strategies during swallow to increase swallowing safety with: Maximum assistance Goal #3: Pt will demonstrate awareness of and sustain attention to puree trials for 1 minute with min verbal cues. Swallow Study Goal #3 - Progress: Progressing toward goal Goal #4: Pt will follow commands to swallow puree trials twice/clear throat as needed with min verbal cues.  Swallow Study Goal #4 - Progress: Progressing toward goal   General HPI: Shirley Bond is an 79 y.o. female with past medical history of COPD (chronic obstructive pulmonary disease); Frequent UTI; Hypertension; Arthritis; Osteoarthritis; Hypercholesteremia; Asthma; Thyroid disease; Environmental allergies; Osteopenia; Macular degeneration; Hearing loss; Constipation; Back pain; Dementia; PONV (postoperative nausea and vomiting); Hypothyroidism; Blood transfusion; and Seizures. Presented with 2 week hx of feeling poorly at first she was diagnosed with UTI and was treated with IM antibiotics. For 1 week she developed diarrhea with frequent episodes. She has not been eating anything for the past 5 days. Her  mental status has declined and she has been becoming progressively more confused. In ER she was found to be dehydrated with Cr of 5.3 and WBC of 21.7. Hospitalist service to admit for IV rehydration and diarrhea work up. Of note she has hx of esophageal stricture and gets dilatations done about once a year. Her last one was about 1 year ago.  Patient had inital MBS, made NPO with sustained lethargy. Now of TPN, more alert. Repeat MBS for readiness to start diet .  Type of Study: Modified Barium Swallowing Study Reason for Referral: Objectively evaluate swallowing function Previous Swallow Assessment: MBS 01/08/13 Diet Prior to this Study: NPO;TNA Temperature Spikes Noted: No Respiratory Status: Supplemental O2 delivered via (comment) History of Recent Intubation: No Behavior/Cognition: Distractible;Alert;Requires cueing Oral Cavity - Dentition: Poor condition;Missing dentition Oral Motor / Sensory Function: Impaired - see Bedside swallow eval Self-Feeding Abilities: Total assist Patient Positioning: Upright in chair Baseline Vocal Quality: Hoarse Volitional Cough: Congested Volitional Swallow: Able to elicit Anatomy: Other (Comment) (very dry crusted oral mucosa) Pharyngeal Secretions:  (dry crusted)    Reason for Referral Objectively evaluate swallowing function   Oral Phase Oral Preparation/Oral Phase Oral Phase: Impaired Oral - Honey Oral - Honey Teaspoon: Lingual/palatal residue Oral - Nectar Oral - Nectar Teaspoon: Not tested Oral - Thin Oral - Thin Cup: Not tested Oral - Solids Oral - Puree: Lingual/palatal residue   Pharyngeal Phase Pharyngeal Phase Pharyngeal Phase: Impaired Pharyngeal - Honey Pharyngeal - Honey  Teaspoon: Delayed swallow initiation;Reduced epiglottic inversion;Reduced pharyngeal peristalsis;Reduced tongue base retraction;Pharyngeal residue - valleculae;Pharyngeal residue - pyriform sinuses;Premature spillage to pyriform sinuses Pharyngeal - Nectar Pharyngeal  - Nectar Teaspoon: Not tested Pharyngeal - Thin Pharyngeal - Thin Cup: Not tested Pharyngeal - Solids Pharyngeal - Puree: Delayed swallow initiation;Reduced epiglottic inversion;Reduced tongue base retraction;Reduced pharyngeal peristalsis;Reduced anterior laryngeal mobility;Pharyngeal residue - valleculae;Pharyngeal residue - pyriform sinuses  Cervical Esophageal Phase    GO   Harlon Ditty, MA CCC-SLP [phone]  Cervical Esophageal Phase Cervical Esophageal Phase:  (not viewed this test, see previous MBS)         Dandra Velardi, Riley Nearing 01/14/2013, 10:27 AM

## 2013-01-14 NOTE — Progress Notes (Signed)
[facility] PROGRESS NOTE  MACIAH FEEBACK NFA:[id number] DOB: Apr 14, 1924 DOA: Jan 07, 2013 PCP: Terald Sleeper, MD  Brief narrative:  78 y/o female who was recenrtly treated for UTI followed by  diarrhea for almost a week with poor po intake.Her mental status declined and she had been becoming progressively more confused and ultimately minimally responsive. In ER she was found to be dehydrated with Cr of 5.3 and WBC of 21.7.  Of note she has a hx of esophageal strictures and gets dilatations done about once a year by Dr Chales Abrahams at [place]. Her last EGD was in June 2013.[name]   After her admission she was noted to be in septic shock from C.diff. PCCM was consulted on 2/2 and assumed care of the pt until 01/07/2013 and transferred to step down.   Assessment/Plan:  Septic shock  Due to C diff colitis .  Patient was admitted to ICU and treated with abx and iv fluids, CVC was placed on 2/2. Patient was placed on early goal directed therapy protocol and she required pressors briefly.     C diff colitis  Patient was placed on iv flagyl and po vanc on admission .  We did add vancomycin enema on 2/6  Acute kidney injury due to ATN from sepsis  Patient was placed on iv fluids with improvement of renal function  Peak creatinine was 5.44 on 01/03/13 -  Creatinine down to 1 on 01/13/13  Decrease urine out put. No evidence of bladder obstruction on [place]. CT shows ascites and some pleural effusion. Patient has received   IV lasix on 2/9, 2/10, 2/11  Discontinued IV fluids on 01/12/13  Hypotension  Cortisol checked on 2/2 - and it was adequately elevated - no suspicion for adrenal insufficiency    Massive anasarca  After fluid resuscitation in a patient with very low albumin level - 1.2   Hypernatremia  Due to hypovolemia as well as true DH/free water deficit . Resolved with iv fluids.  Ongoing monitoring on TPN  Hypokalemia and Hypocalcemia and Hypomagnesemia  Currently controlled with  TPN   Metabolic acidosis  from bicarb loss w/ diarrhea, acute renal failure, and NS resuscitation efforts  Currently controlled with TPN    Thrombocytopnia  Likely due to acute infection - - continue to avoid heparin products    - no spontaneous blood loss at this time  - improving    Acute encephalopathy  Slowly improving - likely due to toxic metabolic encephalopathy  in setting of severe infection and acute renal failure .  Mildly elevated TSH  Possibly related to infection. Synthroid dose increased this admission. Monitor as outpatient  Reported hx of esophageal strictures  GI has seen - no plans for endo in setting of active GI infection - unfortunately due to the results of the modified barium swallow we are unable to give her clear liquids..  Gets esophageal dilatation by Dr Chales Abrahams in [place] once a year.   Code Status: DNR after palliative care discussion with family on 2/9   Family Communication: daughter at bedside. Disposition Plan: currently inpatient Consultants:  PCCM  [facility]   Procedures:  2/2 CVL L IJ >>  2/4 TTE - poor acustic windows - no useful information obtained  Antibiotics:  2/1 vanc >>  2/1 flagyl >>   DVT prophylaxis:  SCDs   HPI/Subjective:  A little bit more alert today  Objective: Filed Vitals:   01/13/13 1954 01/13/13 2134 01/14/13 0217 01/14/13 0500  BP: 91/68  109/59  Pulse: 100   98  Temp: 98.5 F (36.9 C)   97.3 F (36.3 C)  TempSrc:      Resp: 16   18  Height:      Weight:      SpO2: 94% 86% 94% 97%   Patient Vitals for the past 24 hrs:  BP Temp Pulse Resp SpO2  01/14/13 0500 109/59 mmHg 97.3 F (36.3 C) 98 18 97 %  01/14/13 0217 - - - - 94 %  01/13/13 2134 - - - - 86 %  01/13/13 1954 91/68 mmHg 98.5 F (36.9 C) 100 16 94 %  01/13/13 1417 107/55 mmHg 98 F (36.7 C) 87 20 93 %     Intake/Output Summary (Last 24 hours) at 01/14/13 0743 Last data filed at 01/14/13 0600  Gross per 24 hour   Intake      0 ml  Output    200 ml  Net   -200 ml   Filed Weights   01/03/13 0414 01/03/13 0653  Weight: 49.8 kg (109 lb 12.6 oz) 52.2 kg (115 lb 1.3 oz)    Exam:  General: elderly frail female in NAD Lungs: Bilateral ronchus, crackles  Cardiovascular: Regular rate and rhythm without murmur gallop or rub  Abdomen: mildly distended, bowel sounds positive, no rebound, no ascites, no appreciable mass  Extremities: bilateral edema -     Data Reviewed: Basic Metabolic Panel:  Recent Labs Lab 01/09/13 0525 01/10/13 0520 01/11/13 1520 01/12/13 0500 01/13/13 0500 01/14/13 0445  NA 144 140  --  140 139 138  K 3.4* 3.6  --  2.9* 3.5 3.5  CL 114* 113*  --  106 107 108  CO2 18* 17*  --  25 26 25   GLUCOSE 161* 100*  --  139* 140* 156*  BUN 70* 61*  --  46* 45* 47*  CREATININE 1.74* 1.46*  --  1.18* 1.06 0.97  CALCIUM 7.6* 7.3*  --  6.9* 7.1* 7.2*  MG  --  1.7 1.6 1.5  --   --   PHOS  --  3.7  --  3.4  --   --    Liver Function Tests:  Recent Labs Lab 01/08/13 0500 01/10/13 0520 01/12/13 0500 01/13/13 0500  AST 55* 36 28 27  ALT 24 22 21 20   ALKPHOS 68 62 47 49  BILITOT 0.1* 0.1* 0.1* 0.1*  PROT 4.1* 3.9* 3.6* 3.7*  ALBUMIN 1.4* 1.3* 1.2* 1.2*   No results found for this basename: LIPASE, AMYLASE,  in the last 168 hours No results found for this basename: AMMONIA,  in the last 168 hours CBC:  Recent Labs Lab 01/08/13 0500 01/09/13 0525 01/10/13 0520 01/12/13 0500 01/14/13 0445  WBC 7.5 6.8 7.9 8.7 8.2  NEUTROABS  --   --   --  7.4  --   HGB 10.1* 9.6* 9.7* 9.7* 9.1*  HCT 30.2* 27.8* 28.4* 27.6* 26.6*  MCV 89.3 89.1 89.6 87.1 87.8  PLT 28* 25* 36* 72* 87*   Cardiac Enzymes: No results found for this basename: CKTOTAL, CKMB, CKMBINDEX, TROPONINI,  in the last 168 hours BNP (last 3 results) No results found for this basename: PROBNP,  in the last 8760 hours CBG:  Recent Labs Lab 01/13/13 1650 01/13/13 1951 01/14/13 0007 01/14/13 0510  01/14/13 0719  GLUCAP 168* 183* 139* 166* 167*    Recent Results (from the past 240 hour(s))  CULTURE, BLOOD (ROUTINE X 2)     Status: None  Collection Time    01/04/13  1:50 PM      Result Value Range Status   Specimen Description BLOOD RIGHT ARM   Final   Special Requests BOTTLES DRAWN AEROBIC AND ANAEROBIC 10CC   Final   Culture  Setup Time 01/05/2013 15:28   Final   Culture NO GROWTH 5 DAYS   Final   Report Status 01/11/2013 FINAL   Final  CULTURE, BLOOD (ROUTINE X 2)     Status: None   Collection Time    01/04/13  2:00 PM      Result Value Range Status   Specimen Description BLOOD RIGHT ARM   Final   Special Requests BOTTLES DRAWN AEROBIC ONLY 10CC   Final   Culture  Setup Time 01/05/2013 15:27   Final   Culture NO GROWTH 5 DAYS   Final   Report Status 01/11/2013 FINAL   Final     Studies: No results found.  Scheduled Meds: . acetylcysteine  3 mL Nebulization Q6H  . albuterol  2.5 mg Nebulization Q6H  . antiseptic oral rinse  15 mL Mouth Rinse BID  . budesonide-formoterol  2 puff Inhalation BID  . chlorhexidine  15 mL Mouth Rinse BID  . Gerhardt's butt cream   Topical TID  . insulin aspart  0-9 Units Subcutaneous Q4H  . ipratropium  2 spray Each Nare BID  . ipratropium  0.5 mg Nebulization Q6H  . levothyroxine  38 mcg Intravenous Daily  . metronidazole  500 mg Intravenous Q8H  . olopatadine  1 drop Both Eyes BID  . vancomycin (VANCOCIN) rectal ENEMA  500 mg Rectal Q6H   Continuous Infusions: . TPN (CLINIMIX) +/- additives 70 mL/hr at 01/13/13 1715        Jahfari Ambers  [facility] Pager [phone]. If 7PM-7AM, please contact night-coverage at [email or web address], password [facility] 01/14/2013, 7:43 AM  LOS: 12 days

## 2013-01-14 NOTE — Progress Notes (Signed)
Got a call from hospitalist  Dr Lavera Guise, who says that the daughter does not want the palliative to be involved at this time, and that she would like to speak to [place] again when the time comes. At this time she wants to focus on the patient's medical care. Palliative medicine team  will sign off. Re consult [place] again if needed.

## 2013-01-14 NOTE — Progress Notes (Signed)
PARENTERAL NUTRITION CONSULT NOTE - FOLLOW UP  Pharmacy Consult for TPN Indication: Hx esophageal strictures, prolonged NPO status, intolerance to enteral feeds  Allergies  Allergen Reactions  . Codeine Other (See Comments)    Unknown.  [name] Hyomax (Hyoscyamine Sulfate) Other (See Comments)    unknown  . Other     Negative reactions to narcotics/opiates  . Oxycodone Hcl Er Other (See Comments)    unknown  . Sulfa Antibiotics Other (See Comments)    unknown    Patient Measurements: Height: 5\' 2"  (157.5 cm) (per daughter) Weight: 115 lb 1.3 oz (52.2 kg) IBW/kg (Calculated) : 50.1  Vital Signs: Temp: 97.3 F (36.3 C) (02/12 0500) BP: 109/59 mmHg (02/12 0500) Pulse Rate: 98 (02/12 0500) Intake/Output from previous day: 02/11 0701 - 02/12 0700 In: -  Out: 200 [Urine:200] Intake/Output from this shift:    Labs:  Recent Labs  01/12/13 0500 01/14/13 0445  WBC 8.7 8.2  HGB 9.7* 9.1*  HCT 27.6* 26.6*  PLT 72* 87*     Recent Labs  01/11/13 1520 01/12/13 0500 01/13/13 0500 01/14/13 0445  NA  --  140 139 138  K  --  2.9* 3.5 3.5  CL  --  106 107 108  CO2  --  25 26 25   GLUCOSE  --  139* 140* 156*  BUN  --  46* 45* 47*  CREATININE  --  1.18* 1.06 0.97  CALCIUM  --  6.9* 7.1* 7.2*  MG 1.6 1.5  --   --   PHOS  --  3.4  --   --   PROT  --  3.6* 3.7*  --   ALBUMIN  --  1.2* 1.2*  --   AST  --  28 27  --   ALT  --  21 20  --   ALKPHOS  --  47 49  --   BILITOT  --  0.1* 0.1*  --   PREALBUMIN  --  7.5*  --   --   TRIG  --  48  --   --   CHOL  --  53  --   --    Estimated Creatinine Clearance: 32.9 ml/min (by C-G formula based on Cr of 0.97).    Recent Labs  01/14/13 0007 01/14/13 0510 01/14/13 0719  GLUCAP 139* 166* 167*    Insulin Requirements in the past 24 hours:  5 units SSI since TPN bag 2/11 @ 1800  Current Nutrition:  Clinimix E 5/15 at 81ml/hr and Intralipid MWF=averages 1398 kcal and 84g protein weekly with lipids only MWF  Nutritional  Goals:  1350-1550 kCal, 75-85 grams of protein per day, 1.4-1.6L  Assessment: 79 y/o female patient admitted on 1/31 with Cdiff colitis. Also noted hx of esophageal strictures. Pt failed her MBS and has essentially been NPO since admission. Determining plans for possible PEG placement & palliative care consult.   GI: Admitted with Cdiff colitis, NPO since admission d/t failed MBS, hx esophageal strictures.  Endo: No hx DM, hx hypothyroid (TSH 5.6) on levothyroxine- CBGs ok  Lytes: Corr Ca 9.4, all other lytes nml Renal: Scr nml, UOP not completely documented Pulm: 4L [place] - symbicort, albuterol/atroven, Mucomyst nebx  Cards: Hx HTN, HLD - VSS on no cardiac meds.  Hepatobil: LFTs WNL, prealbumin 7.5 down from 9.1, likely d/t ongoing poor PO intake as well as ongoing inflammation.  Neuro: Hx dementia - A&O, palliative care involved. Alert and communicative.  ID: Vanc PR, flagyl for  Cdiff - afebrile, WBC 8.2  Best Practices: SCDs, MC  TPN Access: CVC Triple Lumen   Plan:  - Clinimix E 5/15 at goal rate of 70ml/hr - Lipids, MVI, TE on MWF only d/t national shortage - Will f/up labs in AM - MD, Please specify long-term nutrition plans   Thanks, Geovanna Simko K. Allena Katz, PharmD, BCPS.  Clinical Pharmacist Pager [phone]. 01/14/2013 11:05 AM

## 2013-01-14 NOTE — Progress Notes (Signed)
Speech Language Pathology Dysphagia Treatment Patient Details Name: DEVEN AUDI MRN: [id number] DOB: 1923/12/27 Today's Date: 01/14/2013 Time: 1478-2956 SLP Time Calculation (min): 18 min  Assessment / Plan / Recommendation Clinical Impression  Treatment session to determine pt readiness for reapeat MBS. Pt again alert and responsive to verbal cues to clear throat, complete multiple swallows. Condition of oral tissues continue to be abysmal. Discussed with MD who agrees to attempt swallow eval again to promote Po intake and improved function with use of swallow mechanism. Scheduled for 930 today.     Diet Recommendation  Continue with Current Diet: NPO    SLP Plan MBS   Pertinent Vitals/Pain NA   Swallowing Goals  SLP Swallowing Goals Goal #3: Pt will demonstrate awareness of and sustain attention to puree trials for 1 minute with min verbal cues. Swallow Study Goal #3 - Progress: Progressing toward goal Goal #4: Pt will follow commands to swallow puree trials twice/clear throat as needed with min verbal cues.  Swallow Study Goal #4 - Progress: Progressing toward goal  General Temperature Spikes Noted: No Respiratory Status: Supplemental O2 delivered via (comment) Behavior/Cognition: Distractible;Alert;Requires cueing Oral Cavity - Dentition: Poor condition;Missing dentition Patient Positioning: Upright in bed  Oral Cavity - Oral Hygiene Does patient have any of the following "at risk" factors?: Lips - dry, cracked;Mucous Membranes - reddened;Saliva - thick, dry mouth;Nutritional status - inadequate;Oxygen therapy - cannula, mask, simple oxygen devices Patient is HIGH RISK - Oral Care Protocol followed (see row info): Yes   Dysphagia Treatment Treatment focused on: Upgraded PO texture trials;Facilitation of pharyngeal phase;Facilitation of oral phase Treatment Methods/Modalities: Skilled observation Patient observed directly with PO's: Yes Type of PO's observed: Ice  chips;Dysphagia 1 (puree) Feeding: Total assist Liquids provided via: Teaspoon Pharyngeal Phase Signs & Symptoms: Suspected delayed swallow initiation;Multiple swallows;Wet vocal quality Type of cueing: Verbal Amount of cueing: Moderate   GO     Anwar Crill, Riley Nearing 01/14/2013, 9:04 AM

## 2013-01-15 LAB — GLUCOSE, CAPILLARY
Glucose-Capillary: 140 mg/dL — ABNORMAL HIGH (ref 70–99)
Glucose-Capillary: 183 mg/dL — ABNORMAL HIGH (ref 70–99)

## 2013-01-15 LAB — COMPREHENSIVE METABOLIC PANEL
Albumin: 1.2 g/dL — ABNORMAL LOW (ref 3.5–5.2)
BUN: 50 mg/dL — ABNORMAL HIGH (ref 6–23)
CO2: 25 mEq/L (ref 19–32)
Chloride: 107 mEq/L (ref 96–112)
Creatinine, Ser: 0.86 mg/dL (ref 0.50–1.10)
GFR calc Af Amer: 69 mL/min — ABNORMAL LOW (ref >90)
GFR calc non Af Amer: 59 mL/min — ABNORMAL LOW (ref >90)
Glucose, Bld: 153 mg/dL — ABNORMAL HIGH (ref 70–99)
Total Bilirubin: 0.2 mg/dL — ABNORMAL LOW (ref 0.3–1.2)

## 2013-01-15 LAB — PHOSPHORUS: Phosphorus: 3.8 mg/dL (ref 2.3–4.6)

## 2013-01-15 LAB — MAGNESIUM: Magnesium: 1.7 mg/dL (ref 1.5–2.5)

## 2013-01-15 MED ORDER — FUROSEMIDE 10 MG/ML IJ SOLN
40.0000 mg | Freq: Four times a day (QID) | INTRAMUSCULAR | Status: DC
Start: 2013-01-15 — End: 2013-01-17
  Administered 2013-01-15 – 2013-01-17 (×9): 40 mg via INTRAVENOUS
  Filled 2013-01-15 (×12): qty 4

## 2013-01-15 MED ORDER — CLINIMIX E/DEXTROSE (5/15) 5 % IV SOLN
INTRAVENOUS | Status: AC
  Administered 2013-01-15: 18:00:00 via INTRAVENOUS
  Filled 2013-01-15: qty 2000

## 2013-01-15 NOTE — Progress Notes (Signed)
Speech Language Pathology Dysphagia Treatment Patient Details Name: Shirley Bond MRN: [id number] DOB: [birth year] Today's Date: 01/15/2013 Time: 8295-6213 SLP Time Calculation (min): 25 min  Assessment / Plan / Recommendation Clinical Impression  Therapy focused on improving oral hygiene, providing safe consumption of 4oz of pudding, pharyngeal strengthening. Pts mucosa with no improvement today, perhaps worse. Pt resistant to oral care but SLP gently completed prior to and after PO. Pt enthusiastically consumed 4 oz of ensure pudding with max verbal cues (loud, low pitch in right ear) successful in eliciting multiple hard, effortful swallows after each bite. Wet vocal quality noted at end of session, cleared with verbal cues for cough and swallow. Pt tolerating current restricitive diet, but not yet ready for upgrade. SLP will continue to follow.     Diet Recommendation  Continue with Current Diet: Dysphagia 1 (puree);Pudding-thick liquid    SLP Plan Continue with current plan of care   Pertinent Vitals/Pain NA   Swallowing Goals  SLP Swallowing Goals Patient will utilize recommended strategies during swallow to increase swallowing safety with: Maximum assistance Swallow Study Goal #2 - Progress: Progressing toward goal  General Temperature Spikes Noted: No Respiratory Status: Supplemental O2 delivered via (comment) Behavior/Cognition: Distractible;Alert;Requires cueing Oral Cavity - Dentition: Poor condition;Missing dentition Patient Positioning: Upright in bed  Oral Cavity - Oral Hygiene Does patient have any of the following "at risk" factors?: Lips - dry, cracked;Mucous Membranes - reddened;Saliva - thick, dry mouth;Nutritional status - inadequate;Oxygen therapy - cannula, mask, simple oxygen devices Patient is HIGH RISK - Oral Care Protocol followed (see row info): Yes   Dysphagia Treatment Treatment focused on: Skilled observation of diet tolerance;Facilitation of oral  preparatory phase;Facilitation of oral phase;Facilitation of pharyngeal phase;Utilization of compensatory strategies Treatment Methods/Modalities: Skilled observation Patient observed directly with PO's: Yes Type of PO's observed: Pudding-thick liquids Feeding: Total assist Liquids provided via: Teaspoon Oral Phase Signs & Symptoms:  (palatal/lingual residual) Pharyngeal Phase Signs & Symptoms: Wet vocal quality Type of cueing: Verbal Amount of cueing: Maximal   GO    Harlon Ditty, MA CCC-SLP [phone]  [name] 01/15/2013, 10:43 AM

## 2013-01-15 NOTE — Progress Notes (Signed)
Call made to [facility]- with [facility]) message left for referral to [facility]- will f/u on referral on 01/16/13

## 2013-01-15 NOTE — Progress Notes (Signed)
[facility] PROGRESS NOTE  Shirley Bond ZOX:[id number] DOB: 04/19/1924 DOA: January 20, 2013 PCP: Terald Sleeper, MD  Brief narrative:  79 y/o female who was recenrtly treated for UTI followed by  diarrhea for almost a week with poor po intake.Her mental status declined and she had been becoming progressively more confused and ultimately minimally responsive. In ER she was found to be dehydrated with Cr of 5.3 and WBC of 21.7.  Of note she has a hx of esophageal strictures and gets dilatations done about once a year by Dr Chales Abrahams at [place]. Her last EGD was in June 2013.[name]   After her admission she was noted to be in septic shock from C.diff. PCCM was consulted on 2/2 and assumed care of the pt until 01/07/2013 and transferred to step down.   Assessment/Plan:  Septic shock  Due to C diff colitis .  Patient was admitted to ICU and treated with abx and iv fluids, CVC was placed on 2/2. Patient was placed on early goal directed therapy protocol and she required pressors briefly.   Resolved   C diff colitis  Patient was placed on iv flagyl and po vanc on admission .  We did switch vancomycin to enema PR on 2/6 due to patient's inability to take pos.   Acute kidney injury due to ATN from sepsis  Patient was placed on iv fluids with improvement of renal function  Peak creatinine was 5.44 on 01/03/13 -  Creatinine down to 1 on 01/13/13  Decrease urine out put. No evidence of bladder obstruction on [place]. CT shows ascites and some pleural effusion. Patient has received   IV lasix on 2/9, 2/10, 2/11  Discontinued IV fluids on 01/12/13  Hypotension  Cortisol checked on 2/2 - and it was adequately elevated - no suspicion for adrenal insufficiency    Massive anasarca  After fluid resuscitation in a patient with very low albumin level - 1.2   Hypernatremia  Due to hypovolemia as well as true DH/free water deficit . Resolved with iv fluids.  Ongoing monitoring on TPN  Hypokalemia and  Hypocalcemia and Hypomagnesemia  Currently controlled with TPN   Metabolic acidosis  from bicarb loss w/ diarrhea, acute renal failure, and NS resuscitation efforts  Currently controlled with TPN    Thrombocytopenia  Likely due to acute infection - - continue to avoid heparin products    - no spontaneous blood loss at this time  - improving    Acute encephalopathy  Slowly improving - likely due to toxic metabolic encephalopathy  in setting of severe infection and acute renal failure .  Mildly elevated TSH  Possibly related to infection. Synthroid dose increased this admission. Monitor as outpatient  Reported hx of esophageal strictures  GI has seen - no plans for endo in setting of active GI infection - unfortunately due to the results of the modified barium swallow we are unable to give her clear liquids..  Gets esophageal dilatation by Dr Chales Abrahams in [place] once a year.   Code Status: DNR after palliative care discussion with family on 2/9  Family Communication: daughter at bedside. Disposition Plan: currently inpatient Consultants:  PCCM  [facility]   Procedures:  2/2 CVL L IJ >>  2/4 TTE - poor acustic windows - no useful information obtained  Antibiotics:  2/1 vanc >>  2/1 flagyl >>   DVT prophylaxis:  SCDs   HPI/Subjective:  Awake but unable to participate much   Objective: Filed Vitals:   01/14/13  1958 01/14/13 2300 01/15/13 0302 01/15/13 0606  BP: 109/95   95/77  Pulse: 106   85  Temp: 98.5 F (36.9 C)   98.7 F (37.1 C)  TempSrc: Axillary   Oral  Resp: 20   20  Height:      Weight:      SpO2:  99% 96% 98%   Patient Vitals for the past 24 hrs:  BP Temp Temp src Pulse Resp SpO2  01/15/13 0606 95/77 mmHg 98.7 F (37.1 C) Oral 85 20 98 %  01/15/13 0302 - - - - - 96 %  01/14/13 2300 - - - - - 99 %  01/14/13 1958 109/95 mmHg 98.5 F (36.9 C) Axillary 106 20 -  01/14/13 1459 - - - - - 95 %    No intake or output data in the 24  hours ending 01/15/13 0823 Filed Weights   01/03/13 0414 01/03/13 0653  Weight: 49.8 kg (109 lb 12.6 oz) 52.2 kg (115 lb 1.3 oz)   I/O last 3 completed shifts: In: -  Out: 200 [Urine:200]  Exam:  General: elderly frail female in NAD Lungs: Bilateral ronchus, crackles  Cardiovascular: Regular rate and rhythm without murmur gallop or rub  Abdomen: mildly distended, bowel sounds positive, no rebound, no ascites, no appreciable mass  Extremities: bilateral edema -     Data Reviewed: Basic Metabolic Panel:  Recent Labs Lab 01/10/13 0520 01/11/13 1520 01/12/13 0500 01/13/13 0500 01/14/13 0445 01/15/13 0500  NA 140  --  140 139 138 139  K 3.6  --  2.9* 3.5 3.5 3.6  CL 113*  --  106 107 108 107  CO2 17*  --  25 26 25 25   GLUCOSE 100*  --  139* 140* 156* 153*  BUN 61*  --  46* 45* 47* 50*  CREATININE 1.46*  --  1.18* 1.06 0.97 0.86  CALCIUM 7.3*  --  6.9* 7.1* 7.2* 7.4*  MG 1.7 1.6 1.5  --   --  1.7  PHOS 3.7  --  3.4  --   --  3.8   Liver Function Tests:  Recent Labs Lab 01/10/13 0520 01/12/13 0500 01/13/13 0500 01/15/13 0500  AST 36 28 27 24   ALT 22 21 20 17   ALKPHOS 62 47 49 58  BILITOT 0.1* 0.1* 0.1* 0.2*  PROT 3.9* 3.6* 3.7* 3.9*  ALBUMIN 1.3* 1.2* 1.2* 1.2*   No results found for this basename: LIPASE, AMYLASE,  in the last 168 hours No results found for this basename: AMMONIA,  in the last 168 hours CBC:  Recent Labs Lab 01/09/13 0525 01/10/13 0520 01/12/13 0500 01/14/13 0445  WBC 6.8 7.9 8.7 8.2  NEUTROABS  --   --  7.4  --   HGB 9.6* 9.7* 9.7* 9.1*  HCT 27.8* 28.4* 27.6* 26.6*  MCV 89.1 89.6 87.1 87.8  PLT 25* 36* 72* 87*   Cardiac Enzymes: No results found for this basename: CKTOTAL, CKMB, CKMBINDEX, TROPONINI,  in the last 168 hours BNP (last 3 results) No results found for this basename: PROBNP,  in the last 8760 hours CBG:  Recent Labs Lab 01/14/13 1142 01/14/13 1622 01/14/13 1956 01/14/13 2338 01/15/13 0405  GLUCAP 140* 152*  147* 147* 151*    No results found for this or any previous visit (from the past 240 hour(s)).   Studies: Dg Swallowing Func-speech Pathology  01/14/2013  Riley Nearing Deblois, CCC-SLP     01/14/2013 10:31 AM Objective Swallowing Evaluation: Modified Barium  Swallowing Study   Patient Details  Name: MODEAN MCCULLUM MRN: [id number] Date of Birth: 17-Dec-1923  Today's Date: 01/14/2013 Time: 6962-9528 SLP Time Calculation (min): 25 min  Past Medical History:  Past Medical History  Diagnosis Date  . COPD (chronic obstructive pulmonary disease)   . Frequent UTI   . Hypertension   . Arthritis   . Osteoarthritis   . Hypercholesteremia   . Asthma   . Thyroid disease   . Environmental allergies   . Osteopenia   . Macular degeneration   . Hearing loss   . Constipation   . Back pain   . Dementia   . PONV (postoperative nausea and vomiting)   . Hypothyroidism   . Blood transfusion     " no reaction to transfusion "  . Seizures    Past Surgical History:  Past Surgical History  Procedure Laterality Date  . Kyphosis surgery    . Spinal fusion    . Rotator cuff repair      unsuccessful x 2  . Bladder surgery      with mesh & vaginal wall   HPI:  SHATORA WEATHERBEE is an 79 y.o. female with past medical history  of COPD (chronic obstructive pulmonary disease); Frequent UTI;  Hypertension; Arthritis; Osteoarthritis; Hypercholesteremia;  Asthma; Thyroid disease; Environmental allergies; Osteopenia;  Macular degeneration; Hearing loss; Constipation; Back pain;  Dementia; PONV (postoperative nausea and vomiting);  Hypothyroidism; Blood transfusion; and Seizures. Presented with 2  week hx of feeling poorly at first she was diagnosed with UTI and  was treated with IM antibiotics. For 1 week she developed  diarrhea with frequent episodes. She has not been eating anything  for the past 5 days. Her mental status has declined and she has  been becoming progressively more confused. In ER she was found to  be dehydrated with Cr of 5.3 and  WBC of 21.7. Hospitalist service  to admit for IV rehydration and diarrhea work up. Of note she has  hx of esophageal stricture and gets dilatations done about once a  year. Her last one was about 1 year ago.  Patient had inital MBS,  made NPO with sustained lethargy. Now of TPN, more alert. Repeat  MBS for readiness to start diet .      Assessment / Plan / Recommendation Clinical Impression  Dysphagia Diagnosis: Moderate pharyngeal phase dysphagia Clinical impression: Pt presents with similar oropharyngeal  function as seen in previous MBS, though mentation and alertness  have improved, allowing pt to follow commands for precautions and  strategies.   The pt continues to demonstrate moderate to severe motor deficits  due to weakness, decompensation. The pts oral and likely  oropharyngeal mucosa are severely dry. Base of tongue retraction  and epiglottic deflection are impaired, resulting in severe  vallecular residuals with honey thick liquids and puree. With  verbal cues today, the pt was able to swallow 2-3 times with each  bite, clearing 75% of residue. She also attempted a chin tuck  although she could not keep her chin down long enough to initiate  swallow in that position. This may be a helpful strategy to try  at the bedside. Other liquid consistencies were not tested as  aspiration with similar fucntion was observed in preceeding MBS.   At this time, recommend pt initate a puree (dys 1) diet with  pudding thick liquids. Given decompensation and severe dryness of  mucosa, function likely to return only with usage of the swallow  mechanism. With precautions, pt may tolerate restrictive diet and  improve function while still recieiving TNA as primay source of  nutriton. Hopeful for upgrade with clinical evidence of  improvement.   Instructions for PO consumption: Full supervision, oral care  before and after POs, small teaspoon bites of puree, verbal cues  to swallow 2-3 times. Provide small snacks of puree  frequently  throughout the day. Do not try to feed pt full meals as she is at  risk for build up of pharyngeal residual and fatigue.     Treatment Recommendation  Therapy as outlined in treatment plan below    Diet Recommendation Dysphagia 1 (Puree);Pudding-thick liquid   Liquid Administration via: Spoon Medication Administration: Via alternative means Supervision: Full supervision/cueing for compensatory  strategies;Staff feed patient Compensations: Slow rate;Small sips/bites;Multiple dry swallows  after each bite/sip;Effortful swallow Postural Changes and/or Swallow Maneuvers: Seated upright 90  degrees;Upright 30-60 min after meal    Other  Recommendations Oral Care Recommendations: Oral care  before and after PO Other Recommendations: Order thickener from pharmacy;Prohibited  food (jello, ice cream, thin soups);Have oral suction available   Follow Up Recommendations  Skilled Nursing facility    Frequency and Duration min 3x week  2 weeks   Pertinent Vitals/Pain NA    SLP Swallow Goals Patient will utilize recommended strategies during swallow to  increase swallowing safety with: Maximum assistance Goal #3: Pt will demonstrate awareness of and sustain attention  to puree trials for 1 minute with min verbal cues. Swallow Study Goal #3 - Progress: Progressing toward goal Goal #4: Pt will follow commands to swallow puree trials  twice/clear throat as needed with min verbal cues.  Swallow Study Goal #4 - Progress: Progressing toward goal   General HPI: MERION CATON is an 78 y.o. female with past  medical history of COPD (chronic obstructive pulmonary disease);  Frequent UTI; Hypertension; Arthritis; Osteoarthritis;  Hypercholesteremia; Asthma; Thyroid disease; Environmental  allergies; Osteopenia; Macular degeneration; Hearing loss;  Constipation; Back pain; Dementia; PONV (postoperative nausea and  vomiting); Hypothyroidism; Blood transfusion; and Seizures.  Presented with 2 week hx of feeling poorly at first  she was  diagnosed with UTI and was treated with IM antibiotics. For 1  week she developed diarrhea with frequent episodes. She has not  been eating anything for the past 5 days. Her mental status has  declined and she has been becoming progressively more confused.  In ER she was found to be dehydrated with Cr of 5.3 and WBC of  21.7. Hospitalist service to admit for IV rehydration and  diarrhea work up. Of note she has hx of esophageal stricture and  gets dilatations done about once a year. Her last one was about 1  year ago.  Patient had inital MBS, made NPO with sustained  lethargy. Now of TPN, more alert. Repeat MBS for readiness to  start diet .  Type of Study: Modified Barium Swallowing Study Reason for Referral: Objectively evaluate swallowing function Previous Swallow Assessment: MBS 01/08/13 Diet Prior to this Study: NPO;TNA Temperature Spikes Noted: No Respiratory Status: Supplemental O2 delivered via (comment) History of Recent Intubation: No Behavior/Cognition: Distractible;Alert;Requires cueing Oral Cavity - Dentition: Poor condition;Missing dentition Oral Motor / Sensory Function: Impaired - see Bedside swallow  eval Self-Feeding Abilities: Total assist Patient Positioning: Upright in chair Baseline Vocal Quality: Hoarse Volitional Cough: Congested Volitional Swallow: Able to elicit Anatomy: Other (Comment) (very dry crusted oral mucosa) Pharyngeal Secretions:  (dry crusted)    Reason for Referral  Objectively evaluate swallowing function   Oral Phase Oral Preparation/Oral Phase Oral Phase: Impaired Oral - Honey Oral - Honey Teaspoon: Lingual/palatal residue Oral - Nectar Oral - Nectar Teaspoon: Not tested Oral - Thin Oral - Thin Cup: Not tested Oral - Solids Oral - Puree: Lingual/palatal residue   Pharyngeal Phase Pharyngeal Phase Pharyngeal Phase: Impaired Pharyngeal - Honey Pharyngeal - Honey Teaspoon: Delayed swallow initiation;Reduced  epiglottic inversion;Reduced pharyngeal peristalsis;Reduced   tongue base retraction;Pharyngeal residue - valleculae;Pharyngeal  residue - pyriform sinuses;Premature spillage to pyriform sinuses Pharyngeal - Nectar Pharyngeal - Nectar Teaspoon: Not tested Pharyngeal - Thin Pharyngeal - Thin Cup: Not tested Pharyngeal - Solids Pharyngeal - Puree: Delayed swallow initiation;Reduced epiglottic  inversion;Reduced tongue base retraction;Reduced pharyngeal  peristalsis;Reduced anterior laryngeal mobility;Pharyngeal  residue - valleculae;Pharyngeal residue - pyriform sinuses  Cervical Esophageal Phase    GO   Harlon Ditty, MA CCC-SLP [phone]  Cervical Esophageal Phase Cervical Esophageal Phase:  (not viewed this test, see previous  MBS)         DeBlois, Riley Nearing 01/14/2013, 10:27 AM      Scheduled Meds: . acetylcysteine  3 mL Nebulization Q6H  . albuterol  2.5 mg Nebulization Q6H  . antiseptic oral rinse  15 mL Mouth Rinse BID  . budesonide-formoterol  2 puff Inhalation BID  . chlorhexidine  15 mL Mouth Rinse BID  . Gerhardt's butt cream   Topical TID  . insulin aspart  0-9 Units Subcutaneous Q4H  . ipratropium  2 spray Each Nare BID  . ipratropium  0.5 mg Nebulization Q6H  . levothyroxine  38 mcg Intravenous Daily  . metronidazole  500 mg Intravenous Q8H  . olopatadine  1 drop Both Eyes BID  . vancomycin (VANCOCIN) rectal ENEMA  500 mg Rectal TID PC & HS   Continuous Infusions: . fat emulsion 240 mL (01/14/13 1747)  . TPN (CLINIMIX) +/- additives 70 mL/hr at 01/14/13 1747        Morganne Haile  [facility] Pager [phone]. If 7PM-7AM, please contact night-coverage at [email or web address], password [email or web address] 01/15/2013, 8:23 AM  LOS: 13 days

## 2013-01-15 NOTE — Progress Notes (Signed)
Spoke with daughter/POABoyd Kerbs by phone today and she is inquiring about a stay at [facility]- her father was in one in the past and she would like to consider this- CSW to make MD aware of this request as well as RNCM- CSW will go ahead and still work towards SNF options with TPN if needed. Reece Levy, MSW, Theresia Majors [phone]

## 2013-01-15 NOTE — Progress Notes (Signed)
Utilization review completed.  

## 2013-01-15 NOTE — Progress Notes (Signed)
PARENTERAL NUTRITION CONSULT NOTE - FOLLOW UP  Pharmacy Consult for TPN Indication: Hx esophageal strictures, prolonged NPO status, intolerance to enteral feeds  Allergies  Allergen Reactions  . Codeine Other (See Comments)    Unknown.  [name] Hyomax (Hyoscyamine Sulfate) Other (See Comments)    unknown  . Other     Negative reactions to narcotics/opiates  . Oxycodone Hcl Er Other (See Comments)    unknown  . Sulfa Antibiotics Other (See Comments)    unknown    Patient Measurements: Height: 5\' 2"  (157.5 cm) (per daughter) Weight: 115 lb 1.3 oz (52.2 kg) IBW/kg (Calculated) : 50.1  Vital Signs: Temp: 98.7 F (37.1 C) (02/13 0606) Temp src: Oral (02/13 0606) BP: 95/77 mmHg (02/13 0606) Pulse Rate: 85 (02/13 0606) Intake/Output from previous day:   Intake/Output from this shift:    Labs:  Recent Labs  01/14/13 0445  WBC 8.2  HGB 9.1*  HCT 26.6*  PLT 87*     Recent Labs  01/13/13 0500 01/14/13 0445 01/15/13 0500  NA 139 138 139  K 3.5 3.5 3.6  CL 107 108 107  CO2 26 25 25   GLUCOSE 140* 156* 153*  BUN 45* 47* 50*  CREATININE 1.06 0.97 0.86  CALCIUM 7.1* 7.2* 7.4*  MG  --   --  1.7  PHOS  --   --  3.8  PROT 3.7*  --  3.9*  ALBUMIN 1.2*  --  1.2*  AST 27  --  24  ALT 20  --  17  ALKPHOS 49  --  58  BILITOT 0.1*  --  0.2*   Estimated Creatinine Clearance: 37.1 ml/min (by C-G formula based on Cr of 0.86).    Recent Labs  01/14/13 2338 01/15/13 0405 01/15/13 0757  GLUCAP 147* 151* 140*    Insulin Requirements in the past 24 hours:  5 units SSI since TPN bag 2/11 @ 1800  Current Nutrition:  Clinimix E 5/15 at 29ml/hr and Intralipid MWF=averages 1398 kcal and 84g protein weekly with lipids only MWF  Nutritional Goals:  1350-1550 kCal, 75-85 grams of protein per day, 1.4-1.6L  Assessment: 79 y/o female patient admitted on 1/31 with Cdiff colitis. Also noted hx of esophageal strictures. Pt failed her MBS and has essentially been NPO since  admission. Determining plans for possible PEG placement & palliative care consult.   GI: Admitted with Cdiff colitis, NPO since admission d/t failed MBS, hx esophageal strictures.  Endo: No hx DM, hx hypothyroid (TSH 5.6) on levothyroxine- CBGs ok  Lytes: Corr Ca 9.4, all other lytes nml Renal: Scr nml, UOP not completely documented Pulm: 4L [place] - symbicort, albuterol/atroven, Mucomyst nebx  Cards: Hx HTN, HLD - VSS on no cardiac meds.  Hepatobil: LFTs WNL, prealbumin 7.5 down from 9.1, likely d/t ongoing poor PO intake as well as ongoing inflammation.  Neuro: Hx dementia - A&O, palliative care involved. Alert and communicative.  ID: Vanc PR, flagyl for Cdiff - afebrile, WBC 8.2  Best Practices: SCDs, MC  TPN Access: CVC Triple Lumen   Plan:  - Continue Clinimix E 5/15 at goal rate of 25ml/hr - Lipids, MVI, TE on MWF only d/t national shortage - No labs for tomorrow - MD, Please specify long-term nutrition plans   Thanks, Escarlet Saathoff K. Allena Katz, PharmD, BCPS.  Clinical Pharmacist Pager [phone]. 01/15/2013 10:07 AM

## 2013-01-16 LAB — GLUCOSE, CAPILLARY
Glucose-Capillary: 142 mg/dL — ABNORMAL HIGH (ref 70–99)
Glucose-Capillary: 165 mg/dL — ABNORMAL HIGH (ref 70–99)
Glucose-Capillary: 183 mg/dL — ABNORMAL HIGH (ref 70–99)

## 2013-01-16 MED ORDER — FAT EMULSION 20 % IV EMUL
250.0000 mL | INTRAVENOUS | Status: AC
  Administered 2013-01-16: 250 mL via INTRAVENOUS
  Filled 2013-01-16: qty 250

## 2013-01-16 MED ORDER — TRACE MINERALS CR-CU-F-FE-I-MN-MO-SE-ZN IV SOLN
INTRAVENOUS | Status: AC
  Administered 2013-01-16: 18:00:00 via INTRAVENOUS
  Filled 2013-01-16: qty 2000

## 2013-01-16 NOTE — Progress Notes (Signed)
[facility] PROGRESS NOTE  GENESYS COGGESHALL ZOX:[id number] DOB: Sep 05, 1924 DOA: 12/27/2012 PCP: Terald Sleeper, MD  Brief narrative:  79 y/o female who was recenrtly treated for UTI followed by  diarrhea for almost a week with poor po intake.Her mental status declined and she had been becoming progressively more confused and ultimately minimally responsive. In ER she was found to be dehydrated with Cr of 5.3 and WBC of 21.7.  Of note she has a hx of esophageal strictures and gets dilatations done about once a year by Dr Chales Abrahams at [facility]. Her last EGD was in June 2013.[name]   After her admission she was noted to be in septic shock from C.diff. PCCM was consulted on 2/2 and assumed care of the pt until 01/07/2013 and transferred to step down.   Assessment/Plan:  Septic shock  Due to C diff colitis .  Patient was admitted to ICU and treated with abx and iv fluids, CVC was placed on 2/2. Patient was placed on early goal directed therapy protocol and she required pressors briefly.     C diff colitis  Patient was placed on iv flagyl and po vanc on admission .  We did switch vancomycin to enema PR on 2/6 due to patient's inability to take pos.  Patient completed 2 weeks of vancomycin on 01/16/13    Acute kidney injury due to ATN from sepsis  Patient was placed on iv fluids with improvement of renal function  Peak creatinine was 5.44 on 01/03/13 -  Creatinine down to 1 on 01/13/13  Decrease urine out put. No evidence of bladder obstruction on [place]. CT shows ascites and some pleural effusion. Patient has received   IV lasix on 2/9, 2/10, 2/11, 2/12, 2/13, 2/14   Discontinued IV fluids on 01/12/13  Hypotension  Cortisol checked on 2/2 - and it was adequately elevated - no suspicion for adrenal insufficiency    Massive anasarca  After fluid resuscitation in a patient with very low albumin level - 1.2 Patient received iv lasix since 2/9   Hypernatremia  Due to hypovolemia as well as  true DH/free water deficit . Resolved with iv fluids.  Ongoing monitoring on TPN  Hypokalemia and Hypocalcemia and Hypomagnesemia  Currently controlled with TPN   Metabolic acidosis  from bicarb loss w/ diarrhea, acute renal failure, and NS resuscitation efforts  Currently controlled with TPN    Thrombocytopenia  Likely due to acute infection - - continue to avoid heparin products    - no spontaneous blood loss at this time  - improving    Acute encephalopathy  Slowly improving - likely due to toxic metabolic encephalopathy  in setting of severe infection and acute renal failure .  Mildly elevated TSH  Possibly related to infection. Synthroid dose increased this admission. Monitor as outpatient  Reported hx of esophageal strictures  GI has seen - no plans for endo in setting of active GI infection - unfortunately due to the results of the modified barium swallow we are unable to give her clear liquids..  Gets esophageal dilatation by Dr Chales Abrahams in [place] once a year.   Code Status: DNR after palliative care discussion with family on 2/9  Family Communication: daughter at bedside. Disposition Plan: snf vs ltach  Consultants:  PCCM  [facility]   Procedures:  2/2 CVL L IJ >>  2/4 TTE - poor acustic windows - no useful information obtained  Antibiotics:  2/1 vanc po and PR until 2/14  2/1 iv  flagyl >>   DVT prophylaxis:  SCDs   HPI/Subjective:  Remains dazed and minimally verbal  Objective: Filed Vitals:   01/15/13 2137 01/16/13 0300 01/16/13 0313 01/16/13 0524  BP:    90/47  Pulse: 92 93 93   Temp:    98.3 F (36.8 C)  TempSrc:    Axillary  Resp: 20 20 20 22   Height:      Weight:      SpO2: 93% 94% 94% 90%   Patient Vitals for the past 24 hrs:  BP Temp Temp src Pulse Resp SpO2  01/16/13 0524 90/47 mmHg 98.3 F (36.8 C) Axillary - 22 90 %  01/16/13 0313 - - - 93 20 94 %  01/16/13 0300 - - - 93 20 94 %  01/15/13 2137 - - - 92 20 93 %   01/15/13 2100 99/61 mmHg 98.4 F (36.9 C) Axillary 110 22 97 %  01/15/13 1412 108/71 mmHg 98.5 F (36.9 C) Axillary 107 - 90 %  01/15/13 0908 - - - - - 90 %     Intake/Output Summary (Last 24 hours) at 01/16/13 0815 Last data filed at 01/16/13 0700  Gross per 24 hour  Intake      0 ml  Output   1975 ml  Net  -1975 ml   Filed Weights   01/03/13 0414 01/03/13 0653  Weight: 49.8 kg (109 lb 12.6 oz) 52.2 kg (115 lb 1.3 oz)   I/O last 3 completed shifts: In: -  Out: 1975 [Urine:1975]  Exam:  General: elderly frail female in NAD Copious upper airway secretions  Lungs: Bilateral ronchus, crackles  Cardiovascular: Regular rate and rhythm without murmur gallop or rub  Abdomen: mildly distended, bowel sounds positive, no rebound, Extremities: bilateral edema -     Data Reviewed: Basic Metabolic Panel:  Recent Labs Lab 01/10/13 0520 01/11/13 1520 01/12/13 0500 01/13/13 0500 01/14/13 0445 01/15/13 0500  NA 140  --  140 139 138 139  K 3.6  --  2.9* 3.5 3.5 3.6  CL 113*  --  106 107 108 107  CO2 17*  --  25 26 25 25   GLUCOSE 100*  --  139* 140* 156* 153*  BUN 61*  --  46* 45* 47* 50*  CREATININE 1.46*  --  1.18* 1.06 0.97 0.86  CALCIUM 7.3*  --  6.9* 7.1* 7.2* 7.4*  MG 1.7 1.6 1.5  --   --  1.7  PHOS 3.7  --  3.4  --   --  3.8   Liver Function Tests:  Recent Labs Lab 01/10/13 0520 01/12/13 0500 01/13/13 0500 01/15/13 0500  AST 36 28 27 24   ALT 22 21 20 17   ALKPHOS 62 47 49 58  BILITOT 0.1* 0.1* 0.1* 0.2*  PROT 3.9* 3.6* 3.7* 3.9*  ALBUMIN 1.3* 1.2* 1.2* 1.2*   No results found for this basename: LIPASE, AMYLASE,  in the last 168 hours No results found for this basename: AMMONIA,  in the last 168 hours CBC:  Recent Labs Lab 01/10/13 0520 01/12/13 0500 01/14/13 0445  WBC 7.9 8.7 8.2  NEUTROABS  --  7.4  --   HGB 9.7* 9.7* 9.1*  HCT 28.4* 27.6* 26.6*  MCV 89.6 87.1 87.8  PLT 36* 72* 87*   Cardiac Enzymes: No results found for this basename:  CKTOTAL, CKMB, CKMBINDEX, TROPONINI,  in the last 168 hours BNP (last 3 results) No results found for this basename: PROBNP,  in the last 8760 hours  CBG:  Recent Labs Lab 01/15/13 0757 01/15/13 1156 01/15/13 2018 01/16/13 0001 01/16/13 0518  GLUCAP 140* 183* 154* 142* 144*    No results found for this or any previous visit (from the past 240 hour(s)).   Studies: Dg Swallowing Func-speech Pathology  01/14/2013  Riley Nearing Deblois, CCC-SLP     01/14/2013 10:31 AM Objective Swallowing Evaluation: Modified Barium Swallowing Study   Patient Details  Name: ALLIEN MELBERG MRN: [id number] Date of Birth: [birth year]  Today's Date: 01/14/2013 Time: 7829-5621 SLP Time Calculation (min): 25 min  Past Medical History:  Past Medical History  Diagnosis Date  . COPD (chronic obstructive pulmonary disease)   . Frequent UTI   . Hypertension   . Arthritis   . Osteoarthritis   . Hypercholesteremia   . Asthma   . Thyroid disease   . Environmental allergies   . Osteopenia   . Macular degeneration   . Hearing loss   . Constipation   . Back pain   . Dementia   . PONV (postoperative nausea and vomiting)   . Hypothyroidism   . Blood transfusion     " no reaction to transfusion "  . Seizures    Past Surgical History:  Past Surgical History  Procedure Laterality Date  . Kyphosis surgery    . Spinal fusion    . Rotator cuff repair      unsuccessful x 2  . Bladder surgery      with mesh & vaginal wall   HPI:  NAOMIE CROW is an 79 y.o. female with past medical history  of COPD (chronic obstructive pulmonary disease); Frequent UTI;  Hypertension; Arthritis; Osteoarthritis; Hypercholesteremia;  Asthma; Thyroid disease; Environmental allergies; Osteopenia;  Macular degeneration; Hearing loss; Constipation; Back pain;  Dementia; PONV (postoperative nausea and vomiting);  Hypothyroidism; Blood transfusion; and Seizures. Presented with 2  week hx of feeling poorly at first she was diagnosed with UTI and  was treated with IM  antibiotics. For 1 week she developed  diarrhea with frequent episodes. She has not been eating anything  for the past 5 days. Her mental status has declined and she has  been becoming progressively more confused. In ER she was found to  be dehydrated with Cr of 5.3 and WBC of 21.7. Hospitalist service  to admit for IV rehydration and diarrhea work up. Of note she has  hx of esophageal stricture and gets dilatations done about once a  year. Her last one was about 1 year ago.  Patient had inital MBS,  made NPO with sustained lethargy. Now of TPN, more alert. Repeat  MBS for readiness to start diet .      Assessment / Plan / Recommendation Clinical Impression  Dysphagia Diagnosis: Moderate pharyngeal phase dysphagia Clinical impression: Pt presents with similar oropharyngeal  function as seen in previous MBS, though mentation and alertness  have improved, allowing pt to follow commands for precautions and  strategies.   The pt continues to demonstrate moderate to severe motor deficits  due to weakness, decompensation. The pts oral and likely  oropharyngeal mucosa are severely dry. Base of tongue retraction  and epiglottic deflection are impaired, resulting in severe  vallecular residuals with honey thick liquids and puree. With  verbal cues today, the pt was able to swallow 2-3 times with each  bite, clearing 75% of residue. She also attempted a chin tuck  although she could not keep her chin down long enough to initiate  swallow in that position.  This may be a helpful strategy to try  at the bedside. Other liquid consistencies were not tested as  aspiration with similar fucntion was observed in preceeding MBS.   At this time, recommend pt initate a puree (dys 1) diet with  pudding thick liquids. Given decompensation and severe dryness of  mucosa, function likely to return only with usage of the swallow  mechanism. With precautions, pt may tolerate restrictive diet and  improve function while still recieiving TNA as  primay source of  nutriton. Hopeful for upgrade with clinical evidence of  improvement.   Instructions for PO consumption: Full supervision, oral care  before and after POs, small teaspoon bites of puree, verbal cues  to swallow 2-3 times. Provide small snacks of puree frequently  throughout the day. Do not try to feed pt full meals as she is at  risk for build up of pharyngeal residual and fatigue.     Treatment Recommendation  Therapy as outlined in treatment plan below    Diet Recommendation Dysphagia 1 (Puree);Pudding-thick liquid   Liquid Administration via: Spoon Medication Administration: Via alternative means Supervision: Full supervision/cueing for compensatory  strategies;Staff feed patient Compensations: Slow rate;Small sips/bites;Multiple dry swallows  after each bite/sip;Effortful swallow Postural Changes and/or Swallow Maneuvers: Seated upright 90  degrees;Upright 30-60 min after meal    Other  Recommendations Oral Care Recommendations: Oral care  before and after PO Other Recommendations: Order thickener from pharmacy;Prohibited  food (jello, ice cream, thin soups);Have oral suction available   Follow Up Recommendations  Skilled Nursing facility    Frequency and Duration min 3x week  2 weeks   Pertinent Vitals/Pain NA    SLP Swallow Goals Patient will utilize recommended strategies during swallow to  increase swallowing safety with: Maximum assistance Goal #3: Pt will demonstrate awareness of and sustain attention  to puree trials for 1 minute with min verbal cues. Swallow Study Goal #3 - Progress: Progressing toward goal Goal #4: Pt will follow commands to swallow puree trials  twice/clear throat as needed with min verbal cues.  Swallow Study Goal #4 - Progress: Progressing toward goal   General HPI: NANIE DUNKLEBERGER is an 79 y.o. female with past  medical history of COPD (chronic obstructive pulmonary disease);  Frequent UTI; Hypertension; Arthritis; Osteoarthritis;  Hypercholesteremia; Asthma;  Thyroid disease; Environmental  allergies; Osteopenia; Macular degeneration; Hearing loss;  Constipation; Back pain; Dementia; PONV (postoperative nausea and  vomiting); Hypothyroidism; Blood transfusion; and Seizures.  Presented with 2 week hx of feeling poorly at first she was  diagnosed with UTI and was treated with IM antibiotics. For 1  week she developed diarrhea with frequent episodes. She has not  been eating anything for the past 5 days. Her mental status has  declined and she has been becoming progressively more confused.  In ER she was found to be dehydrated with Cr of 5.3 and WBC of  21.7. Hospitalist service to admit for IV rehydration and  diarrhea work up. Of note she has hx of esophageal stricture and  gets dilatations done about once a year. Her last one was about 1  year ago.  Patient had inital MBS, made NPO with sustained  lethargy. Now of TPN, more alert. Repeat MBS for readiness to  start diet .  Type of Study: Modified Barium Swallowing Study Reason for Referral: Objectively evaluate swallowing function Previous Swallow Assessment: MBS 01/08/13 Diet Prior to this Study: NPO;TNA Temperature Spikes Noted: No Respiratory Status: Supplemental O2 delivered via (comment) History  of Recent Intubation: No Behavior/Cognition: Distractible;Alert;Requires cueing Oral Cavity - Dentition: Poor condition;Missing dentition Oral Motor / Sensory Function: Impaired - see Bedside swallow  eval Self-Feeding Abilities: Total assist Patient Positioning: Upright in chair Baseline Vocal Quality: Hoarse Volitional Cough: Congested Volitional Swallow: Able to elicit Anatomy: Other (Comment) (very dry crusted oral mucosa) Pharyngeal Secretions:  (dry crusted)    Reason for Referral Objectively evaluate swallowing function   Oral Phase Oral Preparation/Oral Phase Oral Phase: Impaired Oral - Honey Oral - Honey Teaspoon: Lingual/palatal residue Oral - Nectar Oral - Nectar Teaspoon: Not tested Oral - Thin Oral - Thin Cup: Not  tested Oral - Solids Oral - Puree: Lingual/palatal residue   Pharyngeal Phase Pharyngeal Phase Pharyngeal Phase: Impaired Pharyngeal - Honey Pharyngeal - Honey Teaspoon: Delayed swallow initiation;Reduced  epiglottic inversion;Reduced pharyngeal peristalsis;Reduced  tongue base retraction;Pharyngeal residue - valleculae;Pharyngeal  residue - pyriform sinuses;Premature spillage to pyriform sinuses Pharyngeal - Nectar Pharyngeal - Nectar Teaspoon: Not tested Pharyngeal - Thin Pharyngeal - Thin Cup: Not tested Pharyngeal - Solids Pharyngeal - Puree: Delayed swallow initiation;Reduced epiglottic  inversion;Reduced tongue base retraction;Reduced pharyngeal  peristalsis;Reduced anterior laryngeal mobility;Pharyngeal  residue - valleculae;Pharyngeal residue - pyriform sinuses  Cervical Esophageal Phase    GO   Harlon Ditty, MA CCC-SLP [phone]  Cervical Esophageal Phase Cervical Esophageal Phase:  (not viewed this test, see previous  MBS)         DeBlois, Riley Nearing 01/14/2013, 10:27 AM      Scheduled Meds: . acetylcysteine  3 mL Nebulization Q6H  . albuterol  2.5 mg Nebulization Q6H  . antiseptic oral rinse  15 mL Mouth Rinse BID  . budesonide-formoterol  2 puff Inhalation BID  . chlorhexidine  15 mL Mouth Rinse BID  . furosemide  40 mg Intravenous Q6H  . Gerhardt's butt cream   Topical TID  . insulin aspart  0-9 Units Subcutaneous Q4H  . ipratropium  2 spray Each Nare BID  . ipratropium  0.5 mg Nebulization Q6H  . levothyroxine  38 mcg Intravenous Daily  . metronidazole  500 mg Intravenous Q8H  . olopatadine  1 drop Both Eyes BID   Continuous Infusions: . TPN (CLINIMIX) +/- additives 70 mL/hr at 01/15/13 1738        Sammie Denner  [facility] Pager [phone]. If 7PM-7AM, please contact night-coverage at [email or web address], password [facility] 01/16/2013, 8:15 AM  LOS: 14 days

## 2013-01-16 NOTE — Progress Notes (Signed)
[facility] Progress Note  Subjective: Patient alone in room, denies dysphagia, denies feeling need to have her esophagus stretched  Objective: Vital signs in last 24 hours: Temp:  [98.3 F (36.8 C)-98.5 F (36.9 C)] 98.3 F (36.8 C) (02/14 0524) Pulse Rate:  [92-110] 93 (02/14 0313) Resp:  [20-22] 22 (02/14 0524) BP: (90-108)/(47-71) 90/47 mmHg (02/14 0524) SpO2:  [90 %-97 %] 90 % (02/14 0524) Weight change:    PE: Basically unchanged  Lab Results: Results for orders placed during the hospital encounter of 01/01/2013 (from the past 24 hour(s))  GLUCOSE, CAPILLARY     Status: Abnormal   Collection Time    01/15/13  7:57 AM      Result Value Range   Glucose-Capillary 140 (*) 70 - 99 mg/dL  GLUCOSE, CAPILLARY     Status: Abnormal   Collection Time    01/15/13 11:56 AM      Result Value Range   Glucose-Capillary 183 (*) 70 - 99 mg/dL  GLUCOSE, CAPILLARY     Status: Abnormal   Collection Time    01/15/13  8:18 PM      Result Value Range   Glucose-Capillary 154 (*) 70 - 99 mg/dL  GLUCOSE, CAPILLARY     Status: Abnormal   Collection Time    01/16/13 12:01 AM      Result Value Range   Glucose-Capillary 142 (*) 70 - 99 mg/dL   Comment 1 Notify RN    GLUCOSE, CAPILLARY     Status: Abnormal   Collection Time    01/16/13  5:18 AM      Result Value Range   Glucose-Capillary 144 (*) 70 - 99 mg/dL   Comment 1 Notify RN      Studies/Results: Dg Swallowing Func-speech Pathology  01/14/2013  Riley Nearing Deblois, CCC-SLP     01/14/2013 10:31 AM Objective Swallowing Evaluation: Modified Barium Swallowing Study   Patient Details  Name: Shirley Bond MRN: [id number] Date of Birth: [birth year]  Today's Date: 01/14/2013 Time: 4098-1191 SLP Time Calculation (min): 25 min  Past Medical History:  Past Medical History  Diagnosis Date  . COPD (chronic obstructive pulmonary disease)   . Frequent UTI   . Hypertension   . Arthritis   . Osteoarthritis   . Hypercholesteremia   .  Asthma   . Thyroid disease   . Environmental allergies   . Osteopenia   . Macular degeneration   . Hearing loss   . Constipation   . Back pain   . Dementia   . PONV (postoperative nausea and vomiting)   . Hypothyroidism   . Blood transfusion     " no reaction to transfusion "  . Seizures    Past Surgical History:  Past Surgical History  Procedure Laterality Date  . Kyphosis surgery    . Spinal fusion    . Rotator cuff repair      unsuccessful x 2  . Bladder surgery      with mesh & vaginal wall   HPI:  Shirley Bond is an 79 y.o. female with past medical history  of COPD (chronic obstructive pulmonary disease); Frequent UTI;  Hypertension; Arthritis; Osteoarthritis; Hypercholesteremia;  Asthma; Thyroid disease; Environmental allergies; Osteopenia;  Macular degeneration; Hearing loss; Constipation; Back pain;  Dementia; PONV (postoperative nausea and vomiting);  Hypothyroidism; Blood transfusion; and Seizures. Presented with 2  week hx of feeling poorly at first she was diagnosed with UTI and  was treated with IM antibiotics. For 1 week  she developed  diarrhea with frequent episodes. She has not been eating anything  for the past 5 days. Her mental status has declined and she has  been becoming progressively more confused. In ER she was found to  be dehydrated with Cr of 5.3 and WBC of 21.7. Hospitalist service  to admit for IV rehydration and diarrhea work up. Of note she has  hx of esophageal stricture and gets dilatations done about once a  year. Her last one was about 1 year ago.  Patient had inital MBS,  made NPO with sustained lethargy. Now of TPN, more alert. Repeat  MBS for readiness to start diet .      Assessment / Plan / Recommendation Clinical Impression  Dysphagia Diagnosis: Moderate pharyngeal phase dysphagia Clinical impression: Pt presents with similar oropharyngeal  function as seen in previous MBS, though mentation and alertness  have improved, allowing pt to follow commands for precautions and   strategies.   The pt continues to demonstrate moderate to severe motor deficits  due to weakness, decompensation. The pts oral and likely  oropharyngeal mucosa are severely dry. Base of tongue retraction  and epiglottic deflection are impaired, resulting in severe  vallecular residuals with honey thick liquids and puree. With  verbal cues today, the pt was able to swallow 2-3 times with each  bite, clearing 75% of residue. She also attempted a chin tuck  although she could not keep her chin down long enough to initiate  swallow in that position. This may be a helpful strategy to try  at the bedside. Other liquid consistencies were not tested as  aspiration with similar fucntion was observed in preceeding MBS.   At this time, recommend pt initate a puree (dys 1) diet with  pudding thick liquids. Given decompensation and severe dryness of  mucosa, function likely to return only with usage of the swallow  mechanism. With precautions, pt may tolerate restrictive diet and  improve function while still recieiving TNA as primay source of  nutriton. Hopeful for upgrade with clinical evidence of  improvement.   Instructions for PO consumption: Full supervision, oral care  before and after POs, small teaspoon bites of puree, verbal cues  to swallow 2-3 times. Provide small snacks of puree frequently  throughout the day. Do not try to feed pt full meals as she is at  risk for build up of pharyngeal residual and fatigue.     Treatment Recommendation  Therapy as outlined in treatment plan below    Diet Recommendation Dysphagia 1 (Puree);Pudding-thick liquid   Liquid Administration via: Spoon Medication Administration: Via alternative means Supervision: Full supervision/cueing for compensatory  strategies;Staff feed patient Compensations: Slow rate;Small sips/bites;Multiple dry swallows  after each bite/sip;Effortful swallow Postural Changes and/or Swallow Maneuvers: Seated upright 90  degrees;Upright 30-60 min after meal     Other  Recommendations Oral Care Recommendations: Oral care  before and after PO Other Recommendations: Order thickener from pharmacy;Prohibited  food (jello, ice cream, thin soups);Have oral suction available   Follow Up Recommendations  Skilled Nursing facility    Frequency and Duration min 3x week  2 weeks   Pertinent Vitals/Pain NA    SLP Swallow Goals Patient will utilize recommended strategies during swallow to  increase swallowing safety with: Maximum assistance Goal #3: Pt will demonstrate awareness of and sustain attention  to puree trials for 1 minute with min verbal cues. Swallow Study Goal #3 - Progress: Progressing toward goal Goal #4: Pt will follow commands to  swallow puree trials  twice/clear throat as needed with min verbal cues.  Swallow Study Goal #4 - Progress: Progressing toward goal   General HPI: Shirley Bond is an 80 y.o. female with past  medical history of COPD (chronic obstructive pulmonary disease);  Frequent UTI; Hypertension; Arthritis; Osteoarthritis;  Hypercholesteremia; Asthma; Thyroid disease; Environmental  allergies; Osteopenia; Macular degeneration; Hearing loss;  Constipation; Back pain; Dementia; PONV (postoperative nausea and  vomiting); Hypothyroidism; Blood transfusion; and Seizures.  Presented with 2 week hx of feeling poorly at first she was  diagnosed with UTI and was treated with IM antibiotics. For 1  week she developed diarrhea with frequent episodes. She has not  been eating anything for the past 5 days. Her mental status has  declined and she has been becoming progressively more confused.  In ER she was found to be dehydrated with Cr of 5.3 and WBC of  21.7. Hospitalist service to admit for IV rehydration and  diarrhea work up. Of note she has hx of esophageal stricture and  gets dilatations done about once a year. Her last one was about 1  year ago.  Patient had inital MBS, made NPO with sustained  lethargy. Now of TPN, more alert. Repeat MBS for readiness to   start diet .  Type of Study: Modified Barium Swallowing Study Reason for Referral: Objectively evaluate swallowing function Previous Swallow Assessment: MBS 01/08/13 Diet Prior to this Study: NPO;TNA Temperature Spikes Noted: No Respiratory Status: Supplemental O2 delivered via (comment) History of Recent Intubation: No Behavior/Cognition: Distractible;Alert;Requires cueing Oral Cavity - Dentition: Poor condition;Missing dentition Oral Motor / Sensory Function: Impaired - see Bedside swallow  eval Self-Feeding Abilities: Total assist Patient Positioning: Upright in chair Baseline Vocal Quality: Hoarse Volitional Cough: Congested Volitional Swallow: Able to elicit Anatomy: Other (Comment) (very dry crusted oral mucosa) Pharyngeal Secretions:  (dry crusted)    Reason for Referral Objectively evaluate swallowing function   Oral Phase Oral Preparation/Oral Phase Oral Phase: Impaired Oral - Honey Oral - Honey Teaspoon: Lingual/palatal residue Oral - Nectar Oral - Nectar Teaspoon: Not tested Oral - Thin Oral - Thin Cup: Not tested Oral - Solids Oral - Puree: Lingual/palatal residue   Pharyngeal Phase Pharyngeal Phase Pharyngeal Phase: Impaired Pharyngeal - Honey Pharyngeal - Honey Teaspoon: Delayed swallow initiation;Reduced  epiglottic inversion;Reduced pharyngeal peristalsis;Reduced  tongue base retraction;Pharyngeal residue - valleculae;Pharyngeal  residue - pyriform sinuses;Premature spillage to pyriform sinuses Pharyngeal - Nectar Pharyngeal - Nectar Teaspoon: Not tested Pharyngeal - Thin Pharyngeal - Thin Cup: Not tested Pharyngeal - Solids Pharyngeal - Puree: Delayed swallow initiation;Reduced epiglottic  inversion;Reduced tongue base retraction;Reduced pharyngeal  peristalsis;Reduced anterior laryngeal mobility;Pharyngeal  residue - valleculae;Pharyngeal residue - pyriform sinuses  Cervical Esophageal Phase    GO   Harlon Ditty, MA CCC-SLP [phone]  Cervical Esophageal Phase Cervical Esophageal Phase:  (not viewed  this test, see previous  MBS)         DeBlois, Riley Nearing 01/14/2013, 10:27 AM        Assessment: Need for nutritional support relative to acute illness an and general weakness and obtundation, doubt significant component of esophageal stricture preventing nutrition at this point  Plan: Performed do better on modified barium swallow and now is on dysphagia 1 diet. Hopefully if she improves she can be weaned from TPN. If she ever gets back to eating solid food and is having symptoms consistent with a symptomatic stricture would be reasonable to re\re endoscope her. Otherwise I would hold off. We'll sign off for  now. Please call further GI input needed.    Muhannad Bignell C 01/16/2013, 7:55 AM

## 2013-01-16 NOTE — Progress Notes (Signed)
PARENTERAL NUTRITION CONSULT NOTE - FOLLOW UP  Pharmacy Consult for TPN Indication: Hx esophageal strictures, prolonged NPO status, intolerance to enteral feeds  Allergies  Allergen Reactions  . Codeine Other (See Comments)    Unknown.  [name] Hyomax (Hyoscyamine Sulfate) Other (See Comments)    unknown  . Other     Negative reactions to narcotics/opiates  . Oxycodone Hcl Er Other (See Comments)    unknown  . Sulfa Antibiotics Other (See Comments)    unknown    Patient Measurements: Height: 5\' 2"  (157.5 cm) (per daughter) Weight: 115 lb 1.3 oz (52.2 kg) IBW/kg (Calculated) : 50.1  Vital Signs: Temp: 98.3 F (36.8 C) (02/14 0524) Temp src: Axillary (02/14 0524) BP: 90/47 mmHg (02/14 0524) Pulse Rate: 93 (02/14 0313) Intake/Output from previous day: 02/13 0701 - 02/14 0700 In: -  Out: 1975 [Urine:1975] Intake/Output from this shift:    Labs:  Recent Labs  01/14/13 0445  WBC 8.2  HGB 9.1*  HCT 26.6*  PLT 87*     Recent Labs  01/14/13 0445 01/15/13 0500  NA 138 139  K 3.5 3.6  CL 108 107  CO2 25 25  GLUCOSE 156* 153*  BUN 47* 50*  CREATININE 0.97 0.86  CALCIUM 7.2* 7.4*  MG  --  1.7  PHOS  --  3.8  PROT  --  3.9*  ALBUMIN  --  1.2*  AST  --  24  ALT  --  17  ALKPHOS  --  58  BILITOT  --  0.2*   Estimated Creatinine Clearance: 37.1 ml/min (by C-G formula based on Cr of 0.86).    Recent Labs  01/16/13 0001 01/16/13 0518 01/16/13 0824  GLUCAP 142* 144* 150*    Insulin Requirements in the past 24 hours:  2 units SSI since TPN bag 2/13 @ 1800  Current Nutrition:  Clinimix E 5/15 at 21ml/hr and Intralipid MWF=averages 1398 kcal and 84g protein weekly with lipids only MWF  Nutritional Goals:  1350-1550 kCal, 75-85 grams of protein per day, 1.4-1.6L  Assessment: 79 y/o female patient admitted on 1/31 with Cdiff colitis. Also noted hx of esophageal strictures. Pt failed her MBS and has essentially been NPO since admission. Determining plans for  possible PEG placement & palliative care consult.   GI: Admitted with Cdiff colitis, hx esophageal strictures, starting some very limited PO intake. Endo: No hx DM, hx hypothyroid (TSH 5.6) on levothyroxine- CBGs ok  Lytes: Corr Ca 9.4, all other lytes nml Renal: Scr nml, UOP not completely documented.  Lasix for diuresis. Pulm: 3L [place] - symbicort, albuterol/atrovent, Mucomyst nebs  Cards: Hx HTN, HLD - VSS on no cardiac meds.  Hepatobil: LFTs WNL, prealbumin 7.5 down from 9.1, likely d/t ongoing poor PO intake as well as ongoing inflammation.  Neuro: Hx dementia - A&O, palliative care involved. Alert and communicative.  ID: Flagyl IV for Cdiff - afebrile, WBC 8.2  Best Practices: SCDs, MC  TPN Access: CVC Triple Lumen   Plan:  - Continue Clinimix E 5/15 at goal rate of 83ml/hr - Lipids, MVI, TE on MWF only d/t national shortage - BMET, Mag, Phos with AM labs - Will continue to follow for long-term nutrition plans  Estella Husk, Pharm.D., BCPS Clinical Pharmacist Phone: [phone] or [phone] Pager: [phone] 01/16/2013, 9:24 AM

## 2013-01-16 NOTE — Progress Notes (Signed)
Speech Language Pathology Dysphagia Treatment Patient Details Name: Shirley Bond MRN: [id number] DOB: 22-Apr-1924 Today's Date: 01/16/2013 Time: 1450-1520 SLP Time Calculation (min): 30 min  Assessment / Plan / Recommendation Clinical Impression  Theray session again focused on improving oral hygiene, therapeutic consumption of applesauce to facilitate use of swallow mechanism and health of mucosa. Oral hygiene improved today, wounds on tongue appear softer. Thick secretions still present on base of tongue, but they are thinner and easier to remove. After oral care SLP provided trials of puree, pt still able to follow commands to swallow twice and occasionally clear throat. Overt signs of aspiration observed (cough, wet vocal quality). Pt is at high risk of aspiration with trials, but function unlikley to improve without use. Discussed with RN and confirmed verbal acceptance of risk with pts daughter. SLP will f/u on Monday. Encourage staff to continue PO presentations.     Diet Recommendation  Continue with Current Diet: Dysphagia 1 (puree);Pudding-thick liquid    SLP Plan Continue with current plan of care   Pertinent Vitals/Pain NA   Swallowing Goals  SLP Swallowing Goals Patient will utilize recommended strategies during swallow to increase swallowing safety with: Maximum assistance Swallow Study Goal #2 - Progress: Progressing toward goal  General Temperature Spikes Noted: No Respiratory Status: Supplemental O2 delivered via (comment) Behavior/Cognition: Distractible;Alert;Requires cueing Oral Cavity - Dentition: Poor condition;Missing dentition Patient Positioning: Upright in bed  Oral Cavity - Oral Hygiene Does patient have any of the following "at risk" factors?: Lips - dry, cracked;Mucous Membranes - reddened;Saliva - thick, dry mouth;Nutritional status - inadequate;Oxygen therapy - cannula, mask, simple oxygen devices Patient is HIGH RISK - Oral Care Protocol followed  (see row info): Yes   Dysphagia Treatment Treatment focused on: Skilled observation of diet tolerance;Facilitation of oral preparatory phase;Facilitation of oral phase;Facilitation of pharyngeal phase;Utilization of compensatory strategies Treatment Methods/Modalities: Skilled observation Patient observed directly with PO's: Yes Type of PO's observed: Dysphagia 1 (puree) Feeding: Total assist Liquids provided via: Teaspoon Pharyngeal Phase Signs & Symptoms: Wet vocal quality;Delayed cough;Changes in respirations Type of cueing: Verbal Amount of cueing: Maximal   GO    Harlon Ditty, MA CCC-SLP [phone]  [name] 01/16/2013, 3:29 PM

## 2013-01-17 LAB — GLUCOSE, CAPILLARY
Glucose-Capillary: 143 mg/dL — ABNORMAL HIGH (ref 70–99)
Glucose-Capillary: 245 mg/dL — ABNORMAL HIGH (ref 70–99)

## 2013-01-17 LAB — POTASSIUM: Potassium: 3.3 mEq/L — ABNORMAL LOW (ref 3.5–5.1)

## 2013-01-17 LAB — COMPREHENSIVE METABOLIC PANEL
AST: 22 U/L (ref 0–37)
Albumin: 1.1 g/dL — ABNORMAL LOW (ref 3.5–5.2)
Alkaline Phosphatase: 54 U/L (ref 39–117)
Chloride: 108 mEq/L (ref 96–112)
Potassium: 2.8 mEq/L — ABNORMAL LOW (ref 3.5–5.1)
Total Bilirubin: 0.2 mg/dL — ABNORMAL LOW (ref 0.3–1.2)
Total Protein: 3.9 g/dL — ABNORMAL LOW (ref 6.0–8.3)

## 2013-01-17 LAB — PHOSPHORUS: Phosphorus: 4.1 mg/dL (ref 2.3–4.6)

## 2013-01-17 MED ORDER — POTASSIUM CHLORIDE 10 MEQ/50ML IV SOLN
10.0000 meq | INTRAVENOUS | Status: AC
  Administered 2013-01-17 (×6): 10 meq via INTRAVENOUS
  Filled 2013-01-17 (×6): qty 50

## 2013-01-17 MED ORDER — FUROSEMIDE 10 MG/ML IJ SOLN
40.0000 mg | Freq: Two times a day (BID) | INTRAMUSCULAR | Status: DC
  Administered 2013-01-17 – 2013-01-20 (×6): 40 mg via INTRAVENOUS
  Filled 2013-01-17 (×8): qty 4

## 2013-01-17 MED ORDER — POTASSIUM CHLORIDE 10 MEQ/50ML IV SOLN
10.0000 meq | INTRAVENOUS | Status: AC
  Administered 2013-01-17 – 2013-01-18 (×4): 10 meq via INTRAVENOUS
  Filled 2013-01-17 (×4): qty 50

## 2013-01-17 MED ORDER — CLINIMIX E/DEXTROSE (5/15) 5 % IV SOLN
INTRAVENOUS | Status: AC
  Administered 2013-01-17: 19:00:00 via INTRAVENOUS
  Filled 2013-01-17: qty 2000

## 2013-01-17 MED ORDER — MAGNESIUM SULFATE 40 MG/ML IJ SOLN
2.0000 g | Freq: Once | INTRAMUSCULAR | Status: AC
  Administered 2013-01-17: 2 g via INTRAVENOUS
  Filled 2013-01-17: qty 50

## 2013-01-17 NOTE — Progress Notes (Signed)
PARENTERAL NUTRITION CONSULT NOTE - FOLLOW UP  Pharmacy Consult for TPN Indication: Hx esophageal strictures, prolonged NPO status, intolerance to enteral feeds  Allergies  Allergen Reactions  . Codeine Other (See Comments)    Unknown.  [name] Hyomax (Hyoscyamine Sulfate) Other (See Comments)    unknown  . Other     Negative reactions to narcotics/opiates  . Oxycodone Hcl Er Other (See Comments)    unknown  . Sulfa Antibiotics Other (See Comments)    unknown    Patient Measurements: Height: 5\' 2"  (157.5 cm) (per daughter) Weight: 115 lb 1.3 oz (52.2 kg) IBW/kg (Calculated) : 50.1  Vital Signs: Temp: 98.7 F (37.1 C) (02/15 0608) Temp src: Axillary (02/15 0608) BP: 94/68 mmHg (02/15 0608) Pulse Rate: 93 (02/15 0608) Intake/Output from previous day: 02/14 0701 - 02/15 0700 In: 200 [IV Piggyback:200] Out: 1878 [Urine:1875; Stool:3] Intake/Output from this shift:    Labs: No results found for this basename: WBC, HGB, HCT, PLT, APTT, INR,  in the last 72 hours   Recent Labs  01/15/13 0500 01/17/13 0427  NA 139 143  K 3.6 2.8*  CL 107 108  CO2 25 26  GLUCOSE 153* 145*  BUN 50* 58*  CREATININE 0.86 0.82  CALCIUM 7.4* 7.2*  MG 1.7 1.6  PHOS 3.8 4.1  PROT 3.9* 3.9*  ALBUMIN 1.2* 1.1*  AST 24 22  ALT 17 16  ALKPHOS 58 54  BILITOT 0.2* 0.2*   Estimated Creatinine Clearance: 38.9 ml/min (by C-G formula based on Cr of 0.82).    Recent Labs  01/17/13 0005 01/17/13 0414 01/17/13 0754  GLUCAP 126* 143* 145*    Insulin Requirements in the past 24 hours:  5 units SSI since TPN bag 2/14 @ 1800  Current Nutrition:  Dysphagia 1 diet + Clinimix E 5/15 at 22ml/hr and Intralipid MWF=averages 1398 kcal and 84g protein weekly with lipids only MWF  Nutritional Goals:  1350-1550 kCal, 75-85 grams of protein per day, 1.4-1.6L  Assessment: 80 y/o female patient admitted on 1/31 with Cdiff colitis. Also noted hx of esophageal strictures. Pt failed her MBS and has  essentially been NPO since admission. Determining plans for possible PEG placement & palliative care consult.   GI: Admitted with Cdiff colitis, hx esophageal strictures, starting some very limited PO intake. Endo: No hx DM, hx hypothyroid (TSH 5.6) on levothyroxine- CBGs are all <180.  Will stop CBG checks and SSI d/t good glycemic control. Lytes: K down to 2.8, Mag 1.6, Corrected Ca 9.5. Renal: Scr nml, UOP not completely documented.  Lasix for diuresis. Pulm: 3L [place] - symbicort, albuterol/atrovent, Mucomyst nebs  Cards: Hx HTN, HLD - VSS on no cardiac meds.  Hepatobil: LFTs WNL, prealbumin 7.5 down from 9.1, likely d/t ongoing poor PO intake as well as ongoing inflammation.  Neuro: Hx dementia - A&O, palliative care involved. Alert and communicative.  ID: Flagyl IV for Cdiff - afebrile, WBC 8.2  Best Practices: SCDs, MC  TPN Access: CVC Triple Lumen   Plan:  - Give KCl x 6 runs + 2gm IV Mag. - Check K at 1800 - Continue Clinimix E 5/15 at goal rate of 89ml/hr - Lipids, MVI, TE on MWF only d/t national shortage - BMET, Mag with AM labs - Discontinue CBG checks and SSI - Will continue to follow for long-term nutrition plans  Estella Husk, Pharm.D., BCPS Clinical Pharmacist Phone: [phone] or [phone] Pager: [phone] 01/17/2013, 10:06 AM

## 2013-01-17 NOTE — Progress Notes (Signed)
PT Cancellation Note  Patient Details Name: Shirley Bond MRN: [id number] DOB: October 26, 1924   Cancelled Treatment:    Reason Eval/Treat Not Completed: Medical issues which prohibited therapy (K currently 2.8 and has not received repletion yet) Will attempt next date.   Delaney Meigs, PT [phone]

## 2013-01-17 NOTE — Progress Notes (Signed)
[facility] PROGRESS NOTE  Shirley Bond:[id number] DOB: 17-Aug-1924 DOA: 01/01/2013 PCP: Terald Sleeper, MD  Brief narrative:  79 y/o female who was recenrtly treated for UTI followed by  diarrhea for almost a week with poor po intake.Her mental status declined and she had been becoming progressively more confused and ultimately minimally responsive. In ER she was found to be dehydrated with Cr of 5.3 and WBC of 21.7.  Of note she has a hx of esophageal strictures and gets dilatations done about once a year by Dr Chales Abrahams at [place]. Her last EGD was in June 2013.[name]   After her admission she was noted to be in septic shock from C.diff. PCCM was consulted on 2/2 and assumed care of the pt until 01/07/2013 and transferred to step down.   Assessment/Plan:  Septic shock  Due to C diff colitis .  Patient was admitted to ICU and treated with abx and iv fluids, CVC was placed on 2/2. Patient was placed on early goal directed therapy protocol and she required pressors briefly.     C diff colitis  Patient was placed on iv flagyl and po vanc on admission .  We did switch vancomycin to enema PR on 2/6 due to patient's inability to take pos.  Patient completed 2 weeks of vancomycin on 01/16/13    Acute kidney injury due to ATN from sepsis  Patient was placed on iv fluids with improvement of renal function  Peak creatinine was 5.44 on 01/03/13 -  Creatinine down to 1 on 01/13/13  Decrease urine out put. No evidence of bladder obstruction on [place]. CT shows ascites and some pleural effusion. Patient has received   IV lasix on 2/9, 2/10, 2/11, 2/12, 2/13, 2/14   Discontinued IV fluids on 01/12/13  Hypotension  Cortisol checked on 2/2 - and it was adequately elevated - no suspicion for adrenal insufficiency    Massive anasarca  After fluid resuscitation in a patient with very low albumin level - 1.2 Patient received iv lasix since 2/9   Hypernatremia  Due to hypovolemia as well as  true DH/free water deficit . Resolved with iv fluids.  Ongoing monitoring on TPN  Hypokalemia and Hypocalcemia and Hypomagnesemia  Currently controlled with TPN   Metabolic acidosis  from bicarb loss w/ diarrhea, acute renal failure, and NS resuscitation efforts  Currently controlled with TPN    Thrombocytopenia  Likely due to acute infection - - continue to avoid heparin products    - no spontaneous blood loss at this time  - improving    Acute encephalopathy  Slowly improving - likely due to toxic metabolic encephalopathy  in setting of severe infection and acute renal failure .  Mildly elevated TSH  Possibly related to infection. Synthroid dose increased this admission. Monitor as outpatient  Reported hx of esophageal strictures  GI has seen - no plans for endo in setting of active GI infection - unfortunately due to the results of the modified barium swallow we are unable to give her clear liquids..  Gets esophageal dilatation by Dr Chales Abrahams in [place] once a year.   Code Status: DNR after palliative care discussion with family on 2/9  Family Communication: daughter at bedside. Disposition Plan: snf vs ltach  Consultants:  PCCM  [facility]   Procedures:  2/2 CVL L IJ >>  2/4 TTE - poor acustic windows - no useful information obtained  Antibiotics:  2/1 vanc po and PR until 2/14  2/1 iv  flagyl >> 2/15   DVT prophylaxis:  SCDs   HPI/Subjective:  No significant change   Objective: Filed Vitals:   01/16/13 2031 01/17/13 0100 01/17/13 0608 01/17/13 0820  BP:   94/68   Pulse:  81 93   Temp:   98.7 F (37.1 C)   TempSrc:   Axillary   Resp:  20 20   Height:      Weight:      SpO2: 92% 92% 95% 97%   Patient Vitals for the past 24 hrs:  BP Temp Temp src Pulse Resp SpO2  01/17/13 0820 - - - - - 97 %  01/17/13 0608 94/68 mmHg 98.7 F (37.1 C) Axillary 93 20 95 %  01/17/13 0100 - - - 81 20 92 %  01/16/13 2031 - - - - - 92 %  01/16/13 2012  124/88 mmHg 98.5 F (36.9 C) Oral 95 20 91 %     Intake/Output Summary (Last 24 hours) at 01/17/13 1359 Last data filed at 01/17/13 0500  Gross per 24 hour  Intake    200 ml  Output   1876 ml  Net  -1676 ml   Filed Weights   01/03/13 0414 01/03/13 0653  Weight: 49.8 kg (109 lb 12.6 oz) 52.2 kg (115 lb 1.3 oz)   I/O last 3 completed shifts: In: 200 [IV Piggyback:200] Out: 3203 [Urine:3200; Stool:3]  Exam:  General: elderly frail female in NAD Copious upper airway secretions  Lungs: Bilateral ronchus, crackles  Cardiovascular: Regular rate and rhythm without murmur gallop or rub  Abdomen: mildly distended, bowel sounds positive, no rebound, Extremities: bilateral edema -     Data Reviewed: Basic Metabolic Panel:  Recent Labs Lab 01/11/13 1520 01/12/13 0500 01/13/13 0500 01/14/13 0445 01/15/13 0500 01/17/13 0427  NA  --  140 139 138 139 143  K  --  2.9* 3.5 3.5 3.6 2.8*  CL  --  106 107 108 107 108  CO2  --  25 26 25 25 26   GLUCOSE  --  139* 140* 156* 153* 145*  BUN  --  46* 45* 47* 50* 58*  CREATININE  --  1.18* 1.06 0.97 0.86 0.82  CALCIUM  --  6.9* 7.1* 7.2* 7.4* 7.2*  MG 1.6 1.5  --   --  1.7 1.6  PHOS  --  3.4  --   --  3.8 4.1   Liver Function Tests:  Recent Labs Lab 01/12/13 0500 01/13/13 0500 01/15/13 0500 01/17/13 0427  AST 28 27 24 22   ALT 21 20 17 16   ALKPHOS 47 49 58 54  BILITOT 0.1* 0.1* 0.2* 0.2*  PROT 3.6* 3.7* 3.9* 3.9*  ALBUMIN 1.2* 1.2* 1.2* 1.1*   No results found for this basename: LIPASE, AMYLASE,  in the last 168 hours No results found for this basename: AMMONIA,  in the last 168 hours CBC:  Recent Labs Lab 01/12/13 0500 01/14/13 0445  WBC 8.7 8.2  NEUTROABS 7.4  --   HGB 9.7* 9.1*  HCT 27.6* 26.6*  MCV 87.1 87.8  PLT 72* 87*   Cardiac Enzymes: No results found for this basename: CKTOTAL, CKMB, CKMBINDEX, TROPONINI,  in the last 168 hours BNP (last 3 results) No results found for this basename: PROBNP,  in the  last 8760 hours CBG:  Recent Labs Lab 01/16/13 1711 01/16/13 2014 01/17/13 0005 01/17/13 0414 01/17/13 0754  GLUCAP 153* 165* 126* 143* 145*    No results found for this or any previous visit (  from the past 240 hour(s)).   Studies: No results found.  Scheduled Meds: . acetylcysteine  3 mL Nebulization Q6H  . albuterol  2.5 mg Nebulization Q6H  . antiseptic oral rinse  15 mL Mouth Rinse BID  . budesonide-formoterol  2 puff Inhalation BID  . chlorhexidine  15 mL Mouth Rinse BID  . [START ON 01/18/2013] furosemide  40 mg Intravenous Q12H  . Gerhardt's butt cream   Topical TID  . ipratropium  2 spray Each Nare BID  . levothyroxine  38 mcg Intravenous Daily  . magnesium sulfate 1 - 4 g bolus IVPB  2 g Intravenous Once  . olopatadine  1 drop Both Eyes BID  . potassium chloride  10 mEq Intravenous Q1 Hr x 6   Continuous Infusions: . TPN (CLINIMIX) +/- additives 70 mL/hr at 01/16/13 1744   And  . fat emulsion 250 mL (01/16/13 1744)  . TPN [facility]) +/- additives          Michaiah Holsopple  [facility] Pager [phone]. If 7PM-7AM, please contact night-coverage at [email or web address], password [facility] 01/17/2013, 1:59 PM  LOS: 15 days

## 2013-01-18 DIAGNOSIS — J449 Chronic obstructive pulmonary disease, unspecified: Secondary | ICD-10-CM

## 2013-01-18 LAB — GLUCOSE, CAPILLARY
Glucose-Capillary: 178 mg/dL — ABNORMAL HIGH (ref 70–99)
Glucose-Capillary: 181 mg/dL — ABNORMAL HIGH (ref 70–99)

## 2013-01-18 LAB — BASIC METABOLIC PANEL
BUN: 59 mg/dL — ABNORMAL HIGH (ref 6–23)
CO2: 27 mEq/L (ref 19–32)
Chloride: 112 mEq/L (ref 96–112)
GFR calc non Af Amer: 74 mL/min — ABNORMAL LOW (ref >90)
Glucose, Bld: 171 mg/dL — ABNORMAL HIGH (ref 70–99)
Potassium: 3.6 mEq/L (ref 3.5–5.1)
Sodium: 144 mEq/L (ref 135–145)

## 2013-01-18 MED ORDER — CLINIMIX E/DEXTROSE (5/15) 5 % IV SOLN
INTRAVENOUS | Status: AC
  Administered 2013-01-18: 18:00:00 via INTRAVENOUS
  Filled 2013-01-18: qty 2000

## 2013-01-18 MED ORDER — POTASSIUM CHLORIDE 10 MEQ/50ML IV SOLN
10.0000 meq | INTRAVENOUS | Status: AC
  Administered 2013-01-18 (×2): 10 meq via INTRAVENOUS
  Filled 2013-01-18 (×2): qty 50

## 2013-01-18 NOTE — Progress Notes (Signed)
[facility] PROGRESS NOTE  Shirley Bond ZOX:[id number] DOB: Jul 19, 1924 DOA: 01/29/2013 PCP: Terald Sleeper, MD  Brief narrative:  79 y/o female who was recenrtly treated for UTI followed by  diarrhea for almost a week with poor po intake.Her mental status declined and she had been becoming progressively more confused and ultimately minimally responsive. In ER she was found to be dehydrated with Cr of 5.3 and WBC of 21.7.  Of note she has a hx of esophageal strictures and gets dilatations done about once a year by Dr Chales Abrahams at [place]. Her last EGD was in June 2013.[name]   After her admission she was noted to be in septic shock from C.diff. PCCM was consulted on 2/2 and assumed care of the pt until 01/07/2013 and transferred to step down.   Assessment/Plan:  Septic shock  Due to C diff colitis .  Patient was admitted to ICU and treated with abx and iv fluids, CVC was placed on 2/2. Patient was placed on early goal directed therapy protocol and she required pressors briefly.     C diff colitis  Patient was placed on iv flagyl and po vanc on admission .  We did switch vancomycin to enema PR on 2/6 due to patient's inability to take pos.  Patient completed 2 weeks of vancomycin on 01/16/13    Acute kidney injury due to ATN from sepsis  Patient was placed on iv fluids with improvement of renal function  Peak creatinine was 5.44 on 01/03/13 -  Creatinine down to 1 on 01/13/13  Decrease urine out put. No evidence of bladder obstruction on [place]. CT shows ascites and some pleural effusion. Patient has received   IV lasix on 2/9, 2/10, 2/11, 2/12, 2/13, 2/14, 2/15, 2/16    Discontinued IV fluids on 01/12/13  Hypotension  Cortisol checked on 2/2 - and it was adequately elevated - no suspicion for adrenal insufficiency    Massive anasarca  After fluid resuscitation in a patient with very low albumin level - 1.2 Patient received iv lasix since 2/9   Hypernatremia  Due to hypovolemia  as well as true DH/free water deficit . Resolved with iv fluids.  Ongoing monitoring on TPN  Hypokalemia and Hypocalcemia and Hypomagnesemia  Currently controlled with TPN   Metabolic acidosis  from bicarb loss w/ diarrhea, acute renal failure, and NS resuscitation efforts  Currently controlled with TPN    Thrombocytopenia  Likely due to acute infection - - continue to avoid heparin products    - no spontaneous blood loss at this time  - improving    Acute encephalopathy  Slowly improving - likely due to toxic metabolic encephalopathy  in setting of severe infection and acute renal failure .  Mildly elevated TSH  Possibly related to infection. Synthroid dose increased this admission. Monitor as outpatient  Reported hx of esophageal strictures  GI has seen - no plans for endo in setting of active GI infection - unfortunately due to the results of the modified barium swallow we are unable to give her clear liquids..  Gets esophageal dilatation by Dr Chales Abrahams in [place] once a year.   Code Status: DNR after palliative care discussion with family on 2/9  Family Communication:  Disposition Plan: snf vs ltach  Consultants:  PCCM  [facility]   Procedures:  2/2 CVL L IJ >>  2/4 TTE - poor acustic windows - no useful information obtained  Antibiotics:  2/1 vanc po and PR until 2/14  2/1  iv flagyl >> 2/15   DVT prophylaxis:  SCDs   HPI/Subjective:  Watching a movie on tv   Objective: Filed Vitals:   01/17/13 2110 01/17/13 2122 01/18/13 0414 01/18/13 1009  BP:   106/53   Pulse:   97   Temp:   97.6 F (36.4 C)   TempSrc:   Axillary   Resp:   24   Height:   5' (1.524 m)   Weight:   70.398 kg (155 lb 3.2 oz)   SpO2: 98% 98% 90% 90%   Patient Vitals for the past 24 hrs:  BP Temp Temp src Pulse Resp SpO2 Height Weight  01/18/13 1009 - - - - - 90 % - -  01/18/13 0414 106/53 mmHg 97.6 F (36.4 C) Axillary 97 24 90 % 5' (1.524 m) 70.398 kg (155 lb 3.2 oz)   01/17/13 2122 - - - - - 98 % - -  01/17/13 2110 - - - - - 98 % - -  01/17/13 2100 98/58 mmHg 97.6 F (36.4 C) - 90 20 97 % - -  01/17/13 1500 105/90 mmHg 98.5 F (36.9 C) Axillary 95 22 97 % - -  01/17/13 1457 - - - - - 94 % - -     Intake/Output Summary (Last 24 hours) at 01/18/13 1023 Last data filed at 01/18/13 0450  Gross per 24 hour  Intake    200 ml  Output   1726 ml  Net  -1526 ml   Filed Weights   01/03/13 0414 01/03/13 0653 01/18/13 0414  Weight: 49.8 kg (109 lb 12.6 oz) 52.2 kg (115 lb 1.3 oz) 70.398 kg (155 lb 3.2 oz)   I/O last 3 completed shifts: In: 200 [P.O.:200] Out: 2926 [Urine:2925; Stool:1]  Exam:  General: elderly frail female in NAD Copious upper airway secretions  Lungs: Bilateral ronchi, no wheezes   Cardiovascular: Regular rate and rhythm without murmur gallop or rub  Abdomen: mildly distended, bowel sounds positive, no rebound, no tenderness  Extremities: bilateral edema -     Data Reviewed: Basic Metabolic Panel:  Recent Labs Lab 01/11/13 1520  01/12/13 0500 01/13/13 0500 01/14/13 0445 01/15/13 0500 01/17/13 0427 01/17/13 2000  NA  --   --  140 139 138 139 143  --   K  --   < > 2.9* 3.5 3.5 3.6 2.8* 3.3*  CL  --   --  106 107 108 107 108  --   CO2  --   --  25 26 25 25 26   --   GLUCOSE  --   --  139* 140* 156* 153* 145*  --   BUN  --   --  46* 45* 47* 50* 58*  --   CREATININE  --   --  1.18* 1.06 0.97 0.86 0.82  --   CALCIUM  --   --  6.9* 7.1* 7.2* 7.4* 7.2*  --   MG 1.6  --  1.5  --   --  1.7 1.6  --   PHOS  --   --  3.4  --   --  3.8 4.1  --   < > = values in this interval not displayed. Liver Function Tests:  Recent Labs Lab 01/12/13 0500 01/13/13 0500 01/15/13 0500 01/17/13 0427  AST 28 27 24 22   ALT 21 20 17 16   ALKPHOS 47 49 58 54  BILITOT 0.1* 0.1* 0.2* 0.2*  PROT 3.6* 3.7* 3.9* 3.9*  ALBUMIN 1.2* 1.2*  1.2* 1.1*   No results found for this basename: LIPASE, AMYLASE,  in the last 168 hours No results found  for this basename: AMMONIA,  in the last 168 hours CBC:  Recent Labs Lab 01/12/13 0500 01/14/13 0445  WBC 8.7 8.2  NEUTROABS 7.4  --   HGB 9.7* 9.1*  HCT 27.6* 26.6*  MCV 87.1 87.8  PLT 72* 87*   Cardiac Enzymes: No results found for this basename: CKTOTAL, CKMB, CKMBINDEX, TROPONINI,  in the last 168 hours BNP (last 3 results) No results found for this basename: PROBNP,  in the last 8760 hours CBG:  Recent Labs Lab 01/17/13 0754 01/17/13 1617 01/17/13 1957 01/18/13 0008 01/18/13 0411  GLUCAP 145* 179* 245* 181* 178*    No results found for this or any previous visit (from the past 240 hour(s)).   Studies: No results found.  Scheduled Meds: . acetylcysteine  3 mL Nebulization Q6H  . albuterol  2.5 mg Nebulization Q6H  . antiseptic oral rinse  15 mL Mouth Rinse BID  . budesonide-formoterol  2 puff Inhalation BID  . chlorhexidine  15 mL Mouth Rinse BID  . furosemide  40 mg Intravenous BID  . Gerhardt's butt cream   Topical TID  . ipratropium  2 spray Each Nare BID  . levothyroxine  38 mcg Intravenous Daily  . olopatadine  1 drop Both Eyes BID   Continuous Infusions: . TPN (CLINIMIX) +/- additives 70 mL/hr at 01/17/13 1843        Shain Pauwels  [facility] Pager [phone]. If 7PM-7AM, please contact night-coverage at [email or web address], password [facility] 01/18/2013, 10:23 AM  LOS: 16 days

## 2013-01-18 NOTE — Progress Notes (Signed)
PARENTERAL NUTRITION CONSULT NOTE - FOLLOW UP  Pharmacy Consult for TPN Indication: Hx esophageal strictures, prolonged NPO status, intolerance to enteral feeds  Allergies  Allergen Reactions  . Codeine Other (See Comments)    Unknown.  [name] Hyomax (Hyoscyamine Sulfate) Other (See Comments)    unknown  . Other     Negative reactions to narcotics/opiates  . Oxycodone Hcl Er Other (See Comments)    unknown  . Sulfa Antibiotics Other (See Comments)    unknown    Patient Measurements: Height: 5' (152.4 cm) Weight: 155 lb 3.2 oz (70.398 kg) IBW/kg (Calculated) : 45.5  Vital Signs: Temp: 97.6 F (36.4 C) (02/16 0414) Temp src: Axillary (02/16 0414) BP: 106/53 mmHg (02/16 0414) Pulse Rate: 97 (02/16 0414) Intake/Output from previous day: 02/15 0701 - 02/16 0700 In: 200 [P.O.:200] Out: 1726 [Urine:1725; Stool:1] Intake/Output from this shift:    Labs: No results found for this basename: WBC, HGB, HCT, PLT, APTT, INR,  in the last 72 hours   Recent Labs  01/17/13 0427 01/17/13 2000 01/18/13 0717  NA 143  --  144  K 2.8* 3.3* 3.6  CL 108  --  112  CO2 26  --  27  GLUCOSE 145*  --  171*  BUN 58*  --  59*  CREATININE 0.82  --  0.78  CALCIUM 7.2*  --  7.2*  MG 1.6  --  2.1  PHOS 4.1  --   --   PROT 3.9*  --   --   ALBUMIN 1.1*  --   --   AST 22  --   --   ALT 16  --   --   ALKPHOS 54  --   --   BILITOT 0.2*  --   --    Estimated Creatinine Clearance: 44.2 ml/min (by C-G formula based on Cr of 0.78).    Recent Labs  01/17/13 1957 01/18/13 0008 01/18/13 0411  GLUCAP 245* 181* 178*    Insulin Requirements in the past 24 hours:  SSI dc'd  Current Nutrition:  Dysphagia 1 diet + Clinimix E 5/15 at 88ml/hr and Intralipid MWF=averages 1398 kcal and 84g protein weekly with lipids only MWF  Nutritional Goals:  1350-1550 kCal, 75-85 grams of protein per day, 1.4-1.6L  Assessment: 79 y/o female patient admitted on 1/31 with Cdiff colitis. Also noted hx of  esophageal strictures. Pt failed her MBS and has essentially been NPO since admission. Determining plans for possible PEG placement & palliative care consult.   GI: Admitted with Cdiff colitis, hx esophageal strictures, starting some very limited PO intake. Endo: No hx DM, hx hypothyroid (TSH 5.6) on levothyroxine- CBG checks and SSI were stopped d/t good glycemic control.  Glucose on BMET ok. Lytes: K now 3.6 after repletion 2/15, Mag 2.1, Corrected Ca 9.5. Renal: Scr nml, UOP not completely documented.  Lasix for diuresis. Pulm: 3L [place] - symbicort, albuterol/atrovent, Mucomyst nebs  Cards: Hx HTN, HLD - VSS on no cardiac meds.  Hepatobil: LFTs WNL, prealbumin 7.5 down from 9.1, likely d/t ongoing poor PO intake as well as ongoing inflammation.  Neuro: Hx dementia - A&O, palliative care involved. Alert and communicative.  ID: s/p Cdiff treatment this admit Best Practices: SCDs, MC  TPN Access: CVC Triple Lumen   Plan:  - Give KCl x 2 runs . - Continue Clinimix E 5/15 at goal rate of 76ml/hr - Lipids, MVI, TE on MWF only d/t national shortage - TPN lab panel in AM -  Will continue to follow for long-term nutrition plans  Estella Husk, Pharm.D., BCPS Clinical Pharmacist Phone: [phone] or [phone] Pager: [phone] 01/18/2013, 11:08 AM

## 2013-01-18 NOTE — Progress Notes (Signed)
PT Cancellation Note  Patient Details Name: JNIYA MADARA MRN: [id number] DOB: 1924-11-10   Cancelled Treatment:    Reason Eval/Treat Not Completed: Medical issues which prohibited therapy.  Attempted to see patient for PT eval.  Patient mouthing "no".  Resp rate high with increased WOB.  Spoke with RN.  Will hold PT today and attempt PT eval tomorrow.   Vena Austria 01/18/2013, 5:27 PM

## 2013-01-19 ENCOUNTER — Inpatient Hospital Stay (HOSPITAL_COMMUNITY): Payer: Medicare Other

## 2013-01-19 LAB — DIFFERENTIAL
Eosinophils Absolute: 0.1 10*3/uL (ref 0.0–0.7)
Eosinophils Relative: 1 % (ref 0–5)
Lymphs Abs: 0.6 10*3/uL — ABNORMAL LOW (ref 0.7–4.0)
Monocytes Absolute: 0.3 10*3/uL (ref 0.1–1.0)
Neutrophils Relative %: 82 % — ABNORMAL HIGH (ref 43–77)
WBC Morphology: INCREASED

## 2013-01-19 LAB — CBC
MCH: 29.8 pg (ref 26.0–34.0)
MCHC: 33.8 g/dL (ref 30.0–36.0)
MCV: 88.3 fL (ref 78.0–100.0)
Platelets: 105 10*3/uL — ABNORMAL LOW (ref 150–400)
RBC: 2.48 MIL/uL — ABNORMAL LOW (ref 3.87–5.11)

## 2013-01-19 LAB — COMPREHENSIVE METABOLIC PANEL
ALT: 15 U/L (ref 0–35)
Alkaline Phosphatase: 53 U/L (ref 39–117)
BUN: 61 mg/dL — ABNORMAL HIGH (ref 6–23)
CO2: 24 mEq/L (ref 19–32)
Chloride: 111 mEq/L (ref 96–112)
GFR calc Af Amer: 86 mL/min — ABNORMAL LOW (ref >90)
GFR calc non Af Amer: 74 mL/min — ABNORMAL LOW (ref >90)
Glucose, Bld: 180 mg/dL — ABNORMAL HIGH (ref 70–99)
Potassium: 3.6 mEq/L (ref 3.5–5.1)
Sodium: 143 mEq/L (ref 135–145)
Total Bilirubin: 0.2 mg/dL — ABNORMAL LOW (ref 0.3–1.2)
Total Protein: 4.2 g/dL — ABNORMAL LOW (ref 6.0–8.3)

## 2013-01-19 LAB — TRIGLYCERIDES: Triglycerides: 107 mg/dL (ref <150)

## 2013-01-19 LAB — GLUCOSE, CAPILLARY
Glucose-Capillary: 159 mg/dL — ABNORMAL HIGH (ref 70–99)
Glucose-Capillary: 189 mg/dL — ABNORMAL HIGH (ref 70–99)

## 2013-01-19 LAB — PREALBUMIN: Prealbumin: 16.3 mg/dL — ABNORMAL LOW (ref 17.0–34.0)

## 2013-01-19 LAB — MAGNESIUM: Magnesium: 2 mg/dL (ref 1.5–2.5)

## 2013-01-19 MED ORDER — INSULIN ASPART 100 UNIT/ML ~~LOC~~ SOLN
0.0000 [IU] | SUBCUTANEOUS | Status: DC
  Administered 2013-01-19 (×2): 2 [IU] via SUBCUTANEOUS
  Administered 2013-01-20 (×4): 1 [IU] via SUBCUTANEOUS
  Administered 2013-01-20 (×2): 2 [IU] via SUBCUTANEOUS

## 2013-01-19 MED ORDER — TRACE MINERALS CR-CU-F-FE-I-MN-MO-SE-ZN IV SOLN
INTRAVENOUS | Status: AC
  Administered 2013-01-19: 19:00:00 via INTRAVENOUS
  Filled 2013-01-19: qty 2000

## 2013-01-19 MED ORDER — FAT EMULSION 20 % IV EMUL
250.0000 mL | INTRAVENOUS | Status: AC
  Administered 2013-01-19: 250 mL via INTRAVENOUS
  Filled 2013-01-19: qty 250

## 2013-01-19 NOTE — Evaluation (Signed)
Occupational Therapy Evaluation Patient Details Name: Shirley Bond MRN: [id number] DOB: [birth year] Today's Date: 01/19/2013 Time: 4098-1191 OT Time Calculation (min): 15 min   PT could benefit from palliative consult, air overlay mattress to prevent skin break down and Q2 RN / tech reposition for bed mobility to decr skin break down/ break PNA. MD if you agree please order    OT Assessment / Plan / Recommendation Clinical Impression  79 yo female admitted on 2013/01/26 for UTI Cdiff with septic shock. Pt now presents with acute encephalopathy , acute renal failure and, severe infection of Cdiff. Pt currently with Hgb 7.4. Ot to follow acutely on a trial bases. Recommend SNF v/s LTACH for d/c planning      OT Assessment  Patient needs continued OT Services    Follow Up Recommendations  SNF;LTACH    Barriers to Discharge      Equipment Recommendations  3 in 1 bedside comode;Wheelchair (measurements OT);Wheelchair cushion (measurements OT)    Recommendations for Other Services    Frequency  Other (comment) (1-2 visits)    Precautions / Restrictions Precautions Precautions: Fall   Pertinent Vitals/Pain 4 out 4 dypnea 61 HR at EOB    ADL  Eating/Feeding: +1 Total assistance Where Assessed - Eating/Feeding: Bed level Grooming: +1 Total assistance Where Assessed - Grooming: Supine, head of bed up Upper Body Bathing: +2 Total assistance Upper Body Bathing: Patient Percentage: 0% Where Assessed - Upper Body Bathing: Supported sitting Toilet Transfer:  (unable to participate) Transfers/Ambulation Related to ADLs: not appropriate at this time ADL Comments: Pt with DOE with verbalizing responses to therapist. Pt unable to verbalize without pause 4 out 4 dypsnea. Pt total +2 pt 0% for bed mobility. Pt with HR 61 sitting EOB. pt returned to supine. pt with HR 106 supine. Pt demonstrates bradycardia with EOB. Pt tolerated EOB ~1 minutes. Pt attempting to communicate however speech is  garbled and unable to understand majority of attempts. THIS patient could greatly benefit from palliative consult, mattress overlay for air to decr skin break down and RN position change Q2 due to rattle breath sounds. MD please order if agree. Pt positioned with pillow under Lt hip, pillows bil Ue to decr edema and elevation of bil LE    OT Diagnosis: Generalized weakness;Cognitive deficits  OT Problem List: Decreased strength;Decreased range of motion;Decreased activity tolerance;Impaired balance (sitting and/or standing);Decreased cognition;Decreased safety awareness;Decreased knowledge of use of DME or AE;Decreased knowledge of precautions;Cardiopulmonary status limiting activity;Impaired UE functional use;Pain;Increased edema OT Treatment Interventions: Self-care/ADL training;Therapeutic exercise;Energy conservation;DME and/or AE instruction;Therapeutic activities;Cognitive remediation/compensation;Patient/family education;Balance training   OT Goals Acute Rehab OT Goals OT Goal Formulation: Patient unable to participate in goal setting Time For Goal Achievement: 02/02/13 Potential to Achieve Goals: Poor Miscellaneous OT Goals Miscellaneous OT Goal #1: Pt will tolerate sitting EOB ~3 minutes max (A) as precursor to adls OT Goal: Miscellaneous Goal #1 - Progress: Goal set today Miscellaneous OT Goal #2: Pt will demonstrate following simple command 50% of session for adl task OT Goal: Miscellaneous Goal #2 - Progress: Goal set today Miscellaneous OT Goal #3: PT will initiate bed mobility with neck rotation, reaching with UE or rotation of trunk  as precursor to adls OT Goal: Miscellaneous Goal #3 - Progress: Goal set today  Visit Information  Last OT Received On: 01/19/13 Assistance Needed: +2 PT/OT Co-Evaluation/Treatment: Yes    Subjective Data  Subjective: "10/14---- slurred speech unable to clearly understand year Patient Stated Goal: unabel to participate   Prior  Functioning     Home Living Lives With: Other (Comment) (From [place]) Prior Function Driving: No Vocation: Retired Musician: Expressive difficulties         Vision/Perception Vision - History Patient Visual Report:  (unable to assess due to cognition)   Cognition  Cognition Overall Cognitive Status: History of cognitive impairments - at baseline Orientation Level: Person Behavior During Session: Restless    Extremity/Trunk Assessment Right Upper Extremity Assessment RUE ROM/Strength/Tone: Deficits;Due to impaired cognition Left Upper Extremity Assessment LUE ROM/Strength/Tone: Deficits;Due to impaired cognition Right Lower Extremity Assessment RLE ROM/Strength/Tone: Unable to fully assess;Due to impaired cognition (Not much spontaneous movement noted) Left Lower Extremity Assessment LLE ROM/Strength/Tone: Unable to fully assess;Due to impaired cognition (Not much spontaneous movement noted) Trunk Assessment Trunk Assessment: Kyphotic     Mobility Bed Mobility Bed Mobility: Supine to Sit;Sitting - Scoot to Edge of Bed Supine to Sit: 1: +2 Total assist;HOB elevated Supine to Sit: Patient Percentage: 0% Sitting - Scoot to Edge of Bed: 1: +2 Total assist Sitting - Scoot to Edge of Bed: Patient Percentage: 0% Sit to Supine: 1: +2 Total assist Sit to Supine: Patient Percentage: 0% Details for Bed Mobility Assistance: Pt with no active participation. pt with posterior lean and trunk neck extension with mobility Transfers Transfers: Not assessed     Exercise     Balance Static Sitting Balance Static Sitting - Balance Support: Feet supported;No upper extremity supported Static Sitting - Level of Assistance: 1: +2 Total assist Static Sitting - Comment/# of Minutes: 1   End of Session OT - End of Session Activity Tolerance: Treatment limited secondary to medical complications (Comment) Patient left: in bed;with call bell/phone within  reach Nurse Communication: Mobility status;Precautions  GO     Lucile Shutters 01/19/2013, 3:27 PM Pager: [phone]

## 2013-01-19 NOTE — Evaluation (Addendum)
Physical Therapy Evaluation Patient Details Name: Shirley Bond MRN: [id number] DOB: [birth year] Today's Date: 01/19/2013 Time: 1448-1500 PT Time Calculation (min): 12 min  PT Assessment / Plan / Recommendation Clinical Impression  Pt adm with septic shock and c-diff.  Pt with very limited ability to participate in or tolerate PT.  Will try PT for 1 week to see if pt shows any signs of improvement or better tolerance. Recommend LTACH.    PT Assessment  Patient needs continued PT services    Follow Up Recommendations  LTACH    Does the patient have the potential to tolerate intense rehabilitation      Barriers to Discharge        Equipment Recommendations  None recommended by PT    Recommendations for Other Services     Frequency Min 2X/week    Precautions / Restrictions Precautions Precautions: Fall   Pertinent Vitals/Pain HR to 60 with sitting EOB      Mobility  Bed Mobility Bed Mobility: Supine to Sit;Sitting - Scoot to Edge of Bed Supine to Sit: 1: +2 Total assist;HOB elevated Supine to Sit: Patient Percentage: 0% Sitting - Scoot to Edge of Bed: 1: +2 Total assist Sitting - Scoot to Edge of Bed: Patient Percentage: 0% Sit to Supine: 1: +2 Total assist Sit to Supine: Patient Percentage: 0% Details for Bed Mobility Assistance: Pt with no active participation. pt with posterior lean and trunk neck extension with mobility    Exercises     PT Diagnosis: Generalized weakness  PT Problem List: Decreased strength;Decreased activity tolerance;Decreased balance;Decreased mobility;Decreased cognition PT Treatment Interventions: Functional mobility training;Therapeutic activities;Therapeutic exercise;Balance training;Patient/family education   PT Goals Acute Rehab PT Goals PT Goal Formulation: Patient unable to participate in goal setting Time For Goal Achievement: 01/26/13 Potential to Achieve Goals: Fair Pt will go Supine/Side to Sit: with +2 total assist  (pt=20%) PT Goal: Supine/Side to Sit - Progress: Goal set today Pt will Sit at [facility] of Bed: with max assist;3-5 min PT Goal: Sit at Edge Of Bed - Progress: Goal set today Pt will go Sit to Supine/Side: with +2 total assist (pt=20%) PT Goal: Sit to Supine/Side - Progress: Goal set today Pt will Perform Home Exercise Program: Other (comment) (will tolerate 5 reps of lower extremity ex's with assist.) PT Goal: Perform Home Exercise Program - Progress: Goal set today  Visit Information  Last PT Received On: 01/19/13 Assistance Needed: +2 PT/OT Co-Evaluation/Treatment: Yes    Subjective Data  Subjective: Difficult to understand.  Pt dyspneic. Patient Stated Goal: Pt unable to state.   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, advertising Overall Cognitive Status: History of cognitive impairments - at baseline Orientation Level: Person Behavior During Session: Restless    Extremity/Trunk Assessment Right Upper Extremity Assessment RUE ROM/Strength/Tone: Deficits;Due to impaired cognition Left Upper Extremity Assessment LUE ROM/Strength/Tone: Deficits;Due to impaired cognition Right Lower Extremity Assessment RLE ROM/Strength/Tone: Unable to fully assess;Due to impaired cognition (Not much spontaneous movement noted) Left Lower Extremity Assessment LLE ROM/Strength/Tone: Unable to fully assess;Due to impaired cognition (Not much spontaneous movement noted) Trunk Assessment Trunk Assessment: Kyphotic   Balance Static Sitting Balance Static Sitting - Balance Support: Feet supported;No upper extremity supported Static Sitting - Level of Assistance: 1: +2 Total assist Static Sitting - Comment/# of Minutes: 1  End of Session PT - End of Session Equipment Utilized During Treatment: Oxygen Activity  Tolerance: Patient limited by  fatigue Patient left: in bed Nurse Communication: Mobility status  GP     [facility] 01/19/2013, 3:42 PM  [facility] PT [phone]

## 2013-01-19 NOTE — Progress Notes (Signed)
PARENTERAL NUTRITION CONSULT NOTE - FOLLOW UP  Pharmacy Consult for TPN Indication: Hx esophageal strictures, prolonged NPO status, intolerance to enteral feeds  Allergies  Allergen Reactions  . Codeine Other (See Comments)    Unknown.  [name] Hyomax (Hyoscyamine Sulfate) Other (See Comments)    unknown  . Other     Negative reactions to narcotics/opiates  . Oxycodone Hcl Er Other (See Comments)    unknown  . Sulfa Antibiotics Other (See Comments)    unknown    Patient Measurements: Height: 5' (152.4 cm) Weight: 155 lb 3.2 oz (70.398 kg) IBW/kg (Calculated) : 45.5  Vital Signs: Temp: 97.9 F (36.6 C) (02/17 0600) Temp src: Axillary (02/17 0600) BP: 101/56 mmHg (02/17 0600) Pulse Rate: 88 (02/17 0600) Intake/Output from previous day: 02/16 0701 - 02/17 0700 In: 11524.7 [WUJ:81191.4] Out: 1375 [Urine:1375] Intake/Output from this shift:    Labs:  Recent Labs  01/19/13 0620  WBC 5.7  HGB 7.4*  HCT 21.9*  PLT 105*     Recent Labs  01/17/13 0427 01/17/13 2000 01/18/13 0717 01/19/13 0620  NA 143  --  144 143  K 2.8* 3.3* 3.6 3.6  CL 108  --  112 111  CO2 26  --  27 24  GLUCOSE 145*  --  171* 180*  BUN 58*  --  59* 61*  CREATININE 0.82  --  0.78 0.76  CALCIUM 7.2*  --  7.2* 7.6*  MG 1.6  --  2.1 2.0  PHOS 4.1  --   --  4.0  PROT 3.9*  --   --  4.2*  ALBUMIN 1.1*  --   --  1.2*  AST 22  --   --  20  ALT 16  --   --  15  ALKPHOS 54  --   --  53  BILITOT 0.2*  --   --  0.2*  TRIG  --   --   --  107  CHOL  --   --   --  65   Estimated Creatinine Clearance: 44.2 ml/min (by C-G formula based on Cr of 0.76).    Recent Labs  01/17/13 1957 01/18/13 0008 01/18/13 0411  GLUCAP 245* 181* 178*    Insulin Requirements in the past 24 hours:  SSI dc'd  Current Nutrition:  Dysphagia 1 diet + Clinimix E 5/15 at 47ml/hr and Intralipid MWF=averages 1398 kcal and 84g protein weekly with lipids only MWF  Nutritional Goals:  1350-1550 kCal, 75-85 grams of  protein per day, 1.4-1.6L  Assessment: 79 y/o female patient admitted on 1/31 with Cdiff colitis. Also noted hx of esophageal strictures. Pt failed her MBS and has essentially been NPO since admission. Determining plans for possible PEG placement & palliative care consult.   GI: Admitted with Cdiff colitis, hx esophageal strictures, starting some very limited PO intake.No po intake recorded since 2/15.  Endo: No hx DM, hx hypothyroid (TSH 5.6) on levothyroxine- CBG checks and SSI were stopped d/t good glycemic control.  Glucose on BMET now up to 180.  Lytes: K now 3.6 after repletion 2/15, Corrected Ca 9.84  Renal: Scr 0.76, UOP not completely documented. IV Lasix for diuresis. I/O incorrect.  Pulm: 3L [place] - symbicort, albuterol/atrovent, Mucomyst nebs   Cards: Hx HTN, HLD - 101/56, HR 88 on no cardiac meds.   Hepatobil: LFTs WNL, prealbumin 7.5 down from 9.1, likely d/t ongoing poor PO intake as well as ongoing inflammation. New prealbumin pending.   Neuro: Hx dementia -  A&O, palliative care involved. Alert and communicative.   ID: s/p Cdiff treatment this admit  Best Practices: SCDs, MC   TPN Access: CVC Triple Lumen   Plan:  - Continue Clinimix E 5/15 at goal rate of 37ml/hr - Lipids, MVI, TE on MWF only d/t national shortage - Resume CBG checks and SSI - Will continue to follow for long-term nutrition plans   Baneza Bartoszek S. Merilynn Finland, PharmD, BCPS Clinical Staff Pharmacist Pager [phone]  01/19/2013, 10:14 AM

## 2013-01-19 NOTE — Progress Notes (Signed)
Speech Language Pathology Dysphagia Treatment Patient Details Name: Shirley Bond MRN: [id number] DOB: 08/11/1924 Today's Date: 01/19/2013 Time: 2956-2130 SLP Time Calculation (min): 28 min  Assessment / Plan / Recommendation Clinical Impression  SLP therapy today focused on managment of secretions, restoring swallow function, management of secretions. Though oral hygiene and health of mucosa has greatly imporoved, pt still with weak swallow response and thick pharyngeal secretions that she is making no effort to clear. WIth suction  and verbal cues SLP able to remove a large amount of thick mucous. Given lack of functional gains, question pts prognosis for return of swallow function. WIll continue to follow.     Diet Recommendation  Continue with Current Diet: Dysphagia 1 (puree);Pudding-thick liquid    SLP Plan Continue with current plan of care   Pertinent Vitals/Pain NA   Swallowing Goals  SLP Swallowing Goals Patient will utilize recommended strategies during swallow to increase swallowing safety with: Maximum assistance Swallow Study Goal #2 - Progress: Progressing toward goal  General Temperature Spikes Noted: No Respiratory Status: Supplemental O2 delivered via (comment) Behavior/Cognition: Distractible;Alert;Requires cueing Oral Cavity - Dentition: Poor condition;Missing dentition Patient Positioning: Upright in bed  Oral Cavity - Oral Hygiene Does patient have any of the following "at risk" factors?: Lips - dry, cracked;Mucous Membranes - reddened;Saliva - thick, dry mouth;Nutritional status - inadequate;Oxygen therapy - cannula, mask, simple oxygen devices Patient is HIGH RISK - Oral Care Protocol followed (see row info): Yes   Dysphagia Treatment Treatment focused on: Skilled observation of diet tolerance;Facilitation of oral preparatory phase;Facilitation of oral phase;Facilitation of pharyngeal phase;Utilization of compensatory strategies Treatment  Methods/Modalities: Skilled observation Patient observed directly with PO's: Yes Type of PO's observed: Ice chips Feeding: Total assist Liquids provided via: Teaspoon Pharyngeal Phase Signs & Symptoms: Wet vocal quality;Delayed cough;Changes in respirations Type of cueing: Verbal Amount of cueing: Maximal   GO     Caral Whan, Riley Nearing 01/19/2013, 1:40 PM

## 2013-01-19 NOTE — Progress Notes (Signed)
RT performed NTS on patient obtaining a lot of thick yellow, pink tinged secretions.

## 2013-01-19 NOTE — Progress Notes (Signed)
Contacted patient's daughter by phone to provider her with current SNF bed offers- message left for her to return my call- plan to give her SNF bed offers for selection IF LTAC is not able to accept patient.  Reece Levy, MSW, Theresia Majors [phone]

## 2013-01-19 NOTE — Progress Notes (Signed)
NUTRITION FOLLOW UP  DOCUMENTATION CODES  Per approved criteria   -Severe malnutrition in the context of acute illness or injury    Intervention:    If EN started, recommend initiating Vital AF 1.2 formula at 15 ml/hr and increase by 10 ml ever 8 hours to goal rate of 45 ml/hr to provide 1296 total kcals, 81 gm protein, 876 ml of free water  TPN per pharmacy  RD to follow for nutrition care plan  Nutrition Dx:   Inadequate oral intake related to decreased alertness and altered GI function now as evidenced by PO intake 0%, ongoing  Goal:   TPN to meet >90% of estimated protein needs, maximize energy provision as able during national lipid backorder, met  Monitor:   TPN prescription, PO intake, weight, labs, I/O's  Assessment:   Patient s/p MBSS 2/12 ---> presented with moderate pharyngeal phase dysphagia.  PO intake 0% per flowsheet records.  No plans for endoscopy in setting of active GI infection.  Question long-term nutrition support plan.  Patient is receiving TPN with Clinimix E 5/15 @ 70 ml/hr. Lipids (20% IVFE @ 10 ml/hr), multivitamins, and trace elements are provided 3 times weekly (MWF) due to national backorder. Provides 1398 kcal and 84 grams protein daily (based on weekly average). Meets 100% minimum estimated kcal and 100% minimum estimated protein needs.  If EN started, patient at refeeding risk with formula advancement given malnutrition.  Height: Ht Readings from Last 1 Encounters:  01/18/13 5' (1.524 m)    Weight Status:   Wt Readings from Last 1 Encounters:  01/18/13 155 lb 3.2 oz (70.398 kg)    Re-estimated needs:  Kcal: 1350-1500 Protein: 75-85 gm Fluid: > 1.5 L  Skin: excoriated perineum  Diet Order: Dysphagia 1, pudding thick liquids   Intake/Output Summary (Last 24 hours) at 01/19/13 1003 Last data filed at 01/19/13 0600  Gross per 24 hour  Intake 11524.67 ml  Output   1375 ml  Net 10149.67 ml    Last BM: 2/16  Labs:   Recent  Labs Lab 01/15/13 0500 01/17/13 0427 01/17/13 2000 01/18/13 0717 01/19/13 0620  NA 139 143  --  144 143  K 3.6 2.8* 3.3* 3.6 3.6  CL 107 108  --  112 111  CO2 25 26  --  27 24  BUN 50* 58*  --  59* 61*  CREATININE 0.86 0.82  --  0.78 0.76  CALCIUM 7.4* 7.2*  --  7.2* 7.6*  MG 1.7 1.6  --  2.1 2.0  PHOS 3.8 4.1  --   --  4.0  GLUCOSE 153* 145*  --  171* 180*    CBG (last 3)   Recent Labs  01/17/13 1957 01/18/13 0008 01/18/13 0411  GLUCAP 245* 181* 178*    Scheduled Meds: . acetylcysteine  3 mL Nebulization Q6H  . albuterol  2.5 mg Nebulization Q6H  . antiseptic oral rinse  15 mL Mouth Rinse BID  . budesonide-formoterol  2 puff Inhalation BID  . chlorhexidine  15 mL Mouth Rinse BID  . furosemide  40 mg Intravenous BID  . Gerhardt's butt cream   Topical TID  . ipratropium  2 spray Each Nare BID  . levothyroxine  38 mcg Intravenous Daily  . olopatadine  1 drop Both Eyes BID    Continuous Infusions: . TPN (CLINIMIX) +/- additives 70 mL/hr at 01/18/13 1800    Maureen Chatters, RD, LDN Pager #: [phone] After-Hours Pager #: [phone]

## 2013-01-19 NOTE — Progress Notes (Signed)
[facility] PROGRESS NOTE  Shirley Bond XBJ:[id number] DOB: 31-Dec-1923 DOA: 12/03/2012 PCP: Terald Sleeper, MD  Brief narrative:  79 y/o female who was recenrtly treated for UTI followed by  diarrhea for almost a week with poor po intake.Her mental status declined and she had been becoming progressively more confused and ultimately minimally responsive. In ER she was found to be dehydrated with Cr of 5.3 and WBC of 21.7.  Of note she has a hx of esophageal strictures and gets dilatations done about once a year by Dr Chales Abrahams at [facility]. Her last EGD was in June 2013.[name]   After her admission she was noted to be in septic shock from C.diff. PCCM was consulted on 2/2 and assumed care of the pt until 01/07/2013 and transferred to step down.   Assessment/Plan:  Septic shock  Due to C diff colitis .  Patient was admitted to ICU and treated with abx and iv fluids, CVC was placed on 2/2. Patient was placed on early goal directed therapy protocol and she required pressors briefly.    Ascites  Plan to repeat US to see if it has improved     C diff colitis  Patient was placed on iv flagyl and po vanc on admission .  We did switch vancomycin to enema PR on 2/6 due to patient's inability to take pos.  Patient completed 2 weeks of vancomycin on 01/16/13    Acute kidney injury due to ATN from sepsis  Patient was placed on iv fluids with improvement of renal function  Peak creatinine was 5.44 on 01/03/13 -  Creatinine down to 1 on 01/13/13  Decrease urine out put. No evidence of bladder obstruction on [place]. CT shows ascites and some pleural effusion. Patient has received   IV lasix on 2/9, 2/10, 2/11, 2/12, 2/13, 2/14, 2/15, 2/16    Discontinued IV fluids on 01/12/13  Hypotension  Cortisol checked on 2/2 - and it was adequately elevated - no suspicion for adrenal insufficiency    Massive anasarca  After fluid resuscitation in a patient with very low albumin level - 1.2 Patient received  iv lasix since 2/9   Hypernatremia  Due to hypovolemia as well as true DH/free water deficit . Resolved with iv fluids.  Ongoing monitoring on TPN  Hypokalemia and Hypocalcemia and Hypomagnesemia  Currently controlled with TPN   Metabolic acidosis  from bicarb loss w/ diarrhea, acute renal failure, and NS resuscitation efforts  Currently controlled with TPN    Thrombocytopenia  Likely due to acute infection - - continue to avoid heparin products    - no spontaneous blood loss at this time  - improving    Acute encephalopathy  Slowly improving - likely due to toxic metabolic encephalopathy  in setting of severe infection and acute renal failure .  Mildly elevated TSH  Possibly related to infection. Synthroid dose increased this admission. Monitor as outpatient  Reported hx of esophageal strictures  GI has seen - no plans for endo in setting of active GI infection - unfortunately due to the results of the modified barium swallow we are unable to give her clear liquids..  Gets esophageal dilatation by Dr Chales Abrahams in [place] once a year.   Anemia  Suspect due to acute illness    Code Status: DNR after palliative care discussion with family on 2/9  Family Communication:  Disposition Plan: snf vs ltach  Consultants:  PCCM  [facility]   Procedures:  2/2 CVL L IJ >>  2/4 TTE - poor acustic windows - no useful information obtained  Antibiotics:  2/1 vanc po and PR until 2/14  2/1 iv flagyl >> 2/15   DVT prophylaxis:  SCDs   HPI/Subjective:  Taking a few bites   Objective: Filed Vitals:   01/18/13 1458 01/18/13 2130 01/19/13 0210 01/19/13 0600  BP:  124/56  101/56  Pulse:  103 103 88  Temp:  98.5 F (36.9 C)  97.9 F (36.6 C)  TempSrc:  Axillary  Axillary  Resp:  24 21 22   Height:      Weight:      SpO2: 97% 95% 91% 93%   Patient Vitals for the past 24 hrs:  BP Temp Temp src Pulse Resp SpO2  01/19/13 0600 101/56 mmHg 97.9 F (36.6 C)  Axillary 88 22 93 %  01/19/13 0210 - - - 103 21 91 %  01/18/13 2130 124/56 mmHg 98.5 F (36.9 C) Axillary 103 24 95 %  01/18/13 1458 - - - - - 97 %  01/18/13 1445 105/46 mmHg 98.2 F (36.8 C) Axillary 65 22 90 %     Intake/Output Summary (Last 24 hours) at 01/19/13 1337 Last data filed at 01/19/13 0600  Gross per 24 hour  Intake 11524.67 ml  Output   1375 ml  Net 10149.67 ml   Filed Weights   01/03/13 0414 01/03/13 0653 01/18/13 0414  Weight: 49.8 kg (109 lb 12.6 oz) 52.2 kg (115 lb 1.3 oz) 70.398 kg (155 lb 3.2 oz)   I/O last 3 completed shifts: In: 11524.7  Out: 2101 [Urine:2100; Stool:1]  Exam:  General: elderly frail female in NAD Copious upper airway secretions  Lungs: Bilateral ronchi, no wheezes   Cardiovascular: Regular rate and rhythm without murmur gallop or rub  Abdomen: mildly distended, bowel sounds positive, no rebound, no tenderness  Extremities: bilateral edema -     Data Reviewed: Basic Metabolic Panel:  Recent Labs Lab 01/14/13 0445 01/15/13 0500 01/17/13 0427 01/17/13 2000 01/18/13 0717 01/19/13 0620  NA 138 139 143  --  144 143  K 3.5 3.6 2.8* 3.3* 3.6 3.6  CL 108 107 108  --  112 111  CO2 25 25 26   --  27 24  GLUCOSE 156* 153* 145*  --  171* 180*  BUN 47* 50* 58*  --  59* 61*  CREATININE 0.97 0.86 0.82  --  0.78 0.76  CALCIUM 7.2* 7.4* 7.2*  --  7.2* 7.6*  MG  --  1.7 1.6  --  2.1 2.0  PHOS  --  3.8 4.1  --   --  4.0   Liver Function Tests:  Recent Labs Lab 01/13/13 0500 01/15/13 0500 01/17/13 0427 01/19/13 0620  AST 27 24 22 20   ALT 20 17 16 15   ALKPHOS 49 58 54 53  BILITOT 0.1* 0.2* 0.2* 0.2*  PROT 3.7* 3.9* 3.9* 4.2*  ALBUMIN 1.2* 1.2* 1.1* 1.2*   No results found for this basename: LIPASE, AMYLASE,  in the last 168 hours No results found for this basename: AMMONIA,  in the last 168 hours CBC:  Recent Labs Lab 01/14/13 0445 01/19/13 0620  WBC 8.2 5.7  NEUTROABS  --  4.7  HGB 9.1* 7.4*  HCT 26.6* 21.9*  MCV  87.8 88.3  PLT 87* 105*   Cardiac Enzymes: No results found for this basename: CKTOTAL, CKMB, CKMBINDEX, TROPONINI,  in the last 168 hours BNP (last 3 results) No results found for this basename: PROBNP,  in  the last 8760 hours CBG:  Recent Labs Lab 01/17/13 1140 01/17/13 1617 01/17/13 1957 01/18/13 0008 01/18/13 0411  GLUCAP 159* 179* 245* 181* 178*    No results found for this or any previous visit (from the past 240 hour(s)).   Studies: No results found.  Scheduled Meds: . acetylcysteine  3 mL Nebulization Q6H  . albuterol  2.5 mg Nebulization Q6H  . antiseptic oral rinse  15 mL Mouth Rinse BID  . budesonide-formoterol  2 puff Inhalation BID  . chlorhexidine  15 mL Mouth Rinse BID  . furosemide  40 mg Intravenous BID  . Gerhardt's butt cream   Topical TID  . insulin aspart  0-9 Units Subcutaneous Q4H  . ipratropium  2 spray Each Nare BID  . levothyroxine  38 mcg Intravenous Daily  . olopatadine  1 drop Both Eyes BID   Continuous Infusions: . TPN (CLINIMIX) +/- additives     And  . fat emulsion    . TPN (CLINIMIX) +/- additives 70 mL/hr at 01/18/13 1800        Shirley Bond  [facility] Pager [phone]. If 7PM-7AM, please contact night-coverage at [email or web address], password [facility] 01/19/2013, 1:37 PM  LOS: 17 days

## 2013-01-20 ENCOUNTER — Inpatient Hospital Stay (HOSPITAL_COMMUNITY): Payer: Medicare Other

## 2013-01-20 LAB — GLUCOSE, CAPILLARY
Glucose-Capillary: 149 mg/dL — ABNORMAL HIGH (ref 70–99)
Glucose-Capillary: 153 mg/dL — ABNORMAL HIGH (ref 70–99)
Glucose-Capillary: 143 mg/dL — ABNORMAL HIGH (ref 70–99)

## 2013-01-20 LAB — CBC
Hemoglobin: 7 g/dL — ABNORMAL LOW (ref 12.0–15.0)
Platelets: 95 10*3/uL — ABNORMAL LOW (ref 150–400)
RBC: 2.36 MIL/uL — ABNORMAL LOW (ref 3.87–5.11)
WBC: 2.6 10*3/uL — ABNORMAL LOW (ref 4.0–10.5)

## 2013-01-20 MED ORDER — FUROSEMIDE 10 MG/ML IJ SOLN
40.0000 mg | Freq: Four times a day (QID) | INTRAMUSCULAR | Status: DC
  Administered 2013-01-20 – 2013-01-21 (×4): 40 mg via INTRAVENOUS
  Filled 2013-01-20 (×9): qty 4

## 2013-01-20 MED ORDER — CLINIMIX E/DEXTROSE (5/15) 5 % IV SOLN
INTRAVENOUS | Status: DC
  Administered 2013-01-20: 18:00:00 via INTRAVENOUS
  Filled 2013-01-20: qty 2000

## 2013-01-20 MED ORDER — FUROSEMIDE 10 MG/ML IJ SOLN: 40.0000 mg | Freq: Every day | INTRAMUSCULAR | Status: DC

## 2013-01-20 NOTE — Progress Notes (Signed)
PARENTERAL NUTRITION CONSULT NOTE - FOLLOW UP  Pharmacy Consult for TPN Indication: Hx esophageal strictures  Allergies  Allergen Reactions  . Codeine Other (See Comments)    Unknown.  [name] Hyomax (Hyoscyamine Sulfate) Other (See Comments)    unknown  . Other     Negative reactions to narcotics/opiates  . Oxycodone Hcl Er Other (See Comments)    unknown  . Sulfa Antibiotics Other (See Comments)    unknown    Patient Measurements: Height: 5' (152.4 cm) Weight: 155 lb 3.2 oz (70.398 kg) IBW/kg (Calculated) : 45.5  Vital Signs: Temp: 97.5 F (36.4 C) (02/18 0400) Temp src: Axillary (02/18 0400) BP: 109/51 mmHg (02/18 0400) Pulse Rate: 73 (02/18 0400) Intake/Output from previous day: 02/17 0701 - 02/18 0700 In: -  Out: 1327 [Urine:1325; Stool:2] Intake/Output from this shift:    Labs:  Recent Labs  01/19/13 0620 01/20/13 1010  WBC 5.7 2.6*  HGB 7.4* 7.0*  HCT 21.9* 21.0*  PLT 105* 95*     Recent Labs  01/17/13 2000 01/18/13 0717 01/19/13 0620  NA  --  144 143  K 3.3* 3.6 3.6  CL  --  112 111  CO2  --  27 24  GLUCOSE  --  171* 180*  BUN  --  59* 61*  CREATININE  --  0.78 0.76  CALCIUM  --  7.2* 7.6*  MG  --  2.1 2.0  PHOS  --   --  4.0  PROT  --   --  4.2*  ALBUMIN  --   --  1.2*  AST  --   --  20  ALT  --   --  15  ALKPHOS  --   --  53  BILITOT  --   --  0.2*  PREALBUMIN  --   --  16.3*  TRIG  --   --  107  CHOL  --   --  65   Estimated Creatinine Clearance: 44.2 ml/min (by C-G formula based on Cr of 0.76).    Recent Labs  01/20/13 0016 01/20/13 0418 01/20/13 0729  GLUCAP 147* 137* 149*    Insulin Requirements in the past 12 hours:  5 units Novolog  Current Nutrition:  Dysphagia 1 diet restarted 2/17 Clinimix E 5/15 at 92ml/hr and Intralipid MWF=averages 1398 kcal and 84g protein weekly with lipids only MWF  Nutritional Goals:  1350-1500 kCal, 75-85 grams of protein per day, >/= 1.5 L  Assessment: 79 y/o female patient admitted  on 1/31 with Cdiff colitis. Also noted hx of esophageal strictures. Pt failed her MBS and has essentially been NPO since admission. Determining plans for possible PEG placement & palliative care consult.   GI: Admitted with Cdiff colitis, hx esophageal strictures, starting some very limited PO intake. Sp MBS 2/17 and dysphagia diet started.  Endo: No hx DM, hx hypothyroid (TSH 5.6) on levothyroxine- CBGs/SSI reorderd with inc glucose on BMET.  Lytes: No lytes today.  Renal: Scr 0.76, UOP 0.8 ml/kg/hr. IV Lasix for diuresis. I/O incorrect.  Pulm: 3L [place] - symbicort, albuterol/atrovent, Mucomyst nebs   Cards: Hx HTN, HLD - VSS. on no cardiac meds.   Hepatobil: LFTs WNL, prealbumin up to 16.3.  Neuro: Hx dementia - A&O, palliative care involved. Alert and communicative.   ID: s/p Cdiff treatment this admit  Best Practices: SCDs, MC   TPN Access: CVC Triple Lumen   Plan:  - Continue Clinimix E 5/15 at goal rate of 34ml/hr - Lipids, MVI,  TE on MWF only d/t national shortage - Will f/u po intake and ability to start weaning TPN or change to enteral nutrition.   Christoper Fabian, PharmD, BCPS Clinical pharmacist, pager [phone] 01/20/2013, 11:45 AM

## 2013-01-20 NOTE — Progress Notes (Signed)
Speech Language Pathology Dysphagia Treatment Patient Details Name: Shirley Bond MRN: [id number] DOB: 1924/05/09 Today's Date: 01/20/2013 Time: 4401-0272 SLP Time Calculation (min): 26 min  Assessment / Plan / Recommendation Clinical Impression  Pt seen for continued SLP therapy for restoration of swallow function. Today pt is not able to sustain alertnes despite max cues. Pt is not aware of PO presentation. Recommend pt become NPO. As stated in previous treatment note, pt has not made any progress with swallow function over the past two weeks and today has declined. Discussed with daughter in depth. Will continue to follow pt one time a week to monitor for change in status. Did confirm that pt will be seen tomorrow; if pt becomes alert and able to follow one step commands to swallow will consider resuming pudding/puree diet with known risk for use of swallow mechanism.     Diet Recommendation  Initiate / Change Diet: NPO    SLP Plan     Pertinent Vitals/Pain NA   Swallowing Goals  SLP Swallowing Goals Swallow Study Goal #2 - Progress: Not met Goal #3: Pt will demonstrate awareness of and sustain attention to puree trials for 1 minute with min verbal cues.  General Temperature Spikes Noted: No Respiratory Status: Supplemental O2 delivered via (comment) Behavior/Cognition: Lethargic;Confused;Decreased sustained attention Oral Cavity - Dentition: Poor condition;Missing dentition Patient Positioning: Upright in bed  Oral Cavity - Oral Hygiene Does patient have any of the following "at risk" factors?: Lips - dry, cracked;Mucous Membranes - reddened;Saliva - thick, dry mouth;Nutritional status - inadequate;Oxygen therapy - cannula, mask, simple oxygen devices Patient is HIGH RISK - Oral Care Protocol followed (see row info): Yes   Dysphagia Treatment Treatment focused on: Patient/family/caregiver education;Facilitation of oral phase;Facilitation of oral preparatory phase Treatment  Methods/Modalities: Skilled observation Patient observed directly with PO's: Yes Type of PO's observed: Ice chips Feeding: Total assist Liquids provided via: Teaspoon Oral Phase Signs & Symptoms:  (Pt not aware of PO) Type of cueing: Verbal Amount of cueing: Maximal   GO    Harlon Ditty, MA CCC-SLP [phone]  [name] 01/20/2013, 4:30 PM

## 2013-01-20 NOTE — Progress Notes (Signed)
Patient ZO:[id number] MAXIMA SKELTON      DOB: Dec 26, 1923      EAV:[id number]  Called by Dr. Lavera Guise to reconsult.  The patient's daughter was open to revisiting palliative concepts but still looking to curative long term care.  LTACH denied today.   Attempted to call daughter.  No response at listed number.  Went to room.  She was not present.  Patient resting quietly with Son at bedside.  Updated Dr. Lavera Guise.  Will attempt to arrange a meeting ASAP.  I understand that the patient's condition remains tenuous.   Jakia Kennebrew L. Ladona Ridgel, MD MBA The Palliative Medicine Team at [facility] Phone: [phone] Pager: [phone]

## 2013-01-20 NOTE — Progress Notes (Signed)
CSW stopped in and spoke with daughter today- she met with the Rep from [facility] yesterday- she is still quite adamant that the patient go to [facility] and not to SNF- I continue to reiterate to her that SNF is available and she wants to hear from [facility] before further SNF consideration- RNCM advised of above-  Reece Levy, MSW, [employer] [phone]

## 2013-01-20 NOTE — Progress Notes (Signed)
Called at bedside after patient had an episode of severe bradycardia . Rhythm Strip reviewed - suspect sinus pauses.  Currently patient is in sinus tachycardia HR 100s. Suspect patient has difficulty managing her airway due to extreme frailty. i have informed daughter and son that her prognosis is very poor.   will check CXR  Would increase dose of  Lasix  Burwell Bethel

## 2013-01-20 NOTE — Progress Notes (Signed)
[facility] PROGRESS NOTE  Shirley Bond ZOX:[id number] DOB: 1923/12/28 DOA: 01/01/2013 PCP: Terald Sleeper, MD  Brief narrative:  79 y/o female who was recenrtly treated for UTI followed by  diarrhea for almost a week with poor po intake.Her mental status declined and she had been becoming progressively more confused and ultimately minimally responsive. In ER she was found to be dehydrated with Cr of 5.3 and WBC of 21.7.  Of note she has a hx of esophageal strictures and gets dilatations done about once a year by Dr Chales Abrahams at [place]. Her last EGD was in June 2013.[name]  After her admission she was noted to be in septic shock from C.diff. PCCM was consulted on 2/2 and assumed care of the pt until 01/07/2013.    Assessment/Plan:  Septic shock  Due to C diff colitis .  Patient was admitted to ICU and treated with abx and iv fluids, CVC was placed on 2/2. Patient was placed on early goal directed therapy protocol and she required pressors briefly.    Ascites  From severe protein caloric malnutrition and hypoalbuminemia in the setting of iv fluid support.   C diff colitis  Patient was placed on iv flagyl and po vanc on admission .  We did switch vancomycin to enema PR on 2/6 due to patient's inability to take pos.  Patient completed 2 weeks of vancomycin on 01/16/13    Acute kidney injury due to ATN from sepsis  Patient was placed on iv fluids with improvement of renal function  Peak creatinine was 5.44 on 01/03/13 -  Creatinine down to 1 on 01/13/13  Decrease urine out put. No evidence of bladder obstruction on [place]. CT shows ascites and some pleural effusion. Patient has received   IV lasix on 2/9, 2/10, 2/11, 2/12, 2/13, 2/14, 2/15, 2/16 , 2/17 and 2/18    Discontinued IV fluids on 01/12/13  Hypotension  Cortisol checked on 2/2 - and it was adequately elevated - no suspicion for adrenal insufficiency  Resolved    Massive anasarca  After fluid resuscitation in a patient with  very low albumin level - 1.2 Patient received iv lasix since 2/9   Hypernatremia  Due to hypovolemia as well as true DH/free water deficit . Resolved with iv fluids.  Ongoing monitoring on TPN  Hypokalemia and Hypocalcemia and Hypomagnesemia  Currently controlled with TPN   Metabolic acidosis  from bicarb loss w/ diarrhea, acute renal failure, and NS resuscitation efforts  Currently controlled with TPN    Thrombocytopenia  Likely due to acute infection - nadir 25 on 01/09/13 - up to 95 on 01/20/13  - continue to avoid heparin products    - no spontaneous blood loss at this time    Acute encephalopathy  Slowly improving - likely due to toxic metabolic encephalopathy  in setting of severe infection and acute renal failure. Patient is extremely hard of hearing and is very difficult to communicate with her   Mildly elevated TSH  Possibly related to infection. Synthroid dose increased this admission. Monitor as outpatient  Reported hx of esophageal strictures  GI has seen - no plans for endo in setting of active GI infection - unfortunately due to the results of the modified barium swallow we are unable to give her clear liquids..  Gets esophageal dilatation by Dr Chales Abrahams in [place] once a year.   Anemia  Suspect due to acute illness . No signs of bleeding    Inability to manage secretions due to extreme  frailty     Code Status: DNR after palliative care discussion with family on 2/9  Family Communication: daughter at bedside  Disposition Plan: snf vs ltach (insurance company has denied paying for ltach - daughter will appeal) Consultants:  PCCM  [facility] - re-consulted on 01/20/13   Procedures:  2/2 CVL L IJ >>  2/4 TTE - poor acustic windows - no useful information obtained  Antibiotics:  2/1 vanc po and PR until 2/14  2/1 iv flagyl >> 2/15   DVT prophylaxis:  SCDs   HPI/Subjective:  Lethargic   Objective: Filed Vitals:   01/19/13 2018 01/19/13  2106 01/20/13 0215 01/20/13 0400  BP: 120/49   109/51  Pulse: 71   73  Temp: 97.5 F (36.4 C)   97.5 F (36.4 C)  TempSrc: Axillary   Axillary  Resp: 22   22  Height:      Weight:      SpO2: 95% 98% 99% 96%   Patient Vitals for the past 24 hrs:  BP Temp Temp src Pulse Resp SpO2  01/20/13 0400 109/51 mmHg 97.5 F (36.4 C) Axillary 73 22 96 %  01/20/13 0215 - - - - - 99 %  01/19/13 2106 - - - - - 98 %  01/19/13 2018 120/49 mmHg 97.5 F (36.4 C) Axillary 71 22 95 %  01/19/13 1500 93/33 mmHg - - 93 24 97 %     Intake/Output Summary (Last 24 hours) at 01/20/13 1105 Last data filed at 01/20/13 0654  Gross per 24 hour  Intake      0 ml  Output   1327 ml  Net  -1327 ml   Filed Weights   01/03/13 0414 01/03/13 0653 01/18/13 0414  Weight: 49.8 kg (109 lb 12.6 oz) 52.2 kg (115 lb 1.3 oz) 70.398 kg (155 lb 3.2 oz)   I/O last 3 completed shifts: In: 11524.7  Out: 2052 [Urine:2050; Stool:2]  Exam:  General: elderly frail female in NAD Copious upper airway secretions - unchanged for the last 9 days  Lungs: Bilateral ronchi, no wheezes   Cardiovascular: Regular rate and rhythm without murmur gallop or rub  Abdomen: mildly distended, bowel sounds present, no rebound, no tenderness  Extremities: decreased  bilateral edema -     Data Reviewed: Basic Metabolic Panel:  Recent Labs Lab 01/14/13 0445 01/15/13 0500 01/17/13 0427 01/17/13 2000 01/18/13 0717 01/19/13 0620  NA 138 139 143  --  144 143  K 3.5 3.6 2.8* 3.3* 3.6 3.6  CL 108 107 108  --  112 111  CO2 25 25 26   --  27 24  GLUCOSE 156* 153* 145*  --  171* 180*  BUN 47* 50* 58*  --  59* 61*  CREATININE 0.97 0.86 0.82  --  0.78 0.76  CALCIUM 7.2* 7.4* 7.2*  --  7.2* 7.6*  MG  --  1.7 1.6  --  2.1 2.0  PHOS  --  3.8 4.1  --   --  4.0   Liver Function Tests:  Recent Labs Lab 01/15/13 0500 01/17/13 0427 01/19/13 0620  AST 24 22 20   ALT 17 16 15   ALKPHOS 58 54 53  BILITOT 0.2* 0.2* 0.2*  PROT 3.9* 3.9*  4.2*  ALBUMIN 1.2* 1.1* 1.2*   No results found for this basename: LIPASE, AMYLASE,  in the last 168 hours No results found for this basename: AMMONIA,  in the last 168 hours CBC:  Recent Labs Lab 01/14/13  1610 01/19/13 0620 01/20/13 1010  WBC 8.2 5.7 2.6*  NEUTROABS  --  4.7  --   HGB 9.1* 7.4* 7.0*  HCT 26.6* 21.9* 21.0*  MCV 87.8 88.3 89.0  PLT 87* 105* 95*   Cardiac Enzymes: No results found for this basename: CKTOTAL, CKMB, CKMBINDEX, TROPONINI,  in the last 168 hours BNP (last 3 results) No results found for this basename: PROBNP,  in the last 8760 hours CBG:  Recent Labs Lab 01/19/13 1703 01/19/13 2006 01/20/13 0016 01/20/13 0418 01/20/13 0729  GLUCAP 181* 189* 147* 137* 149*    No results found for this or any previous visit (from the past 240 hour(s)).   Studies: US Abdomen Limited  01/19/2013  *RADIOLOGY REPORT*  Clinical Data: Measure ascites  LIMITED ABDOMEN ULTRASOUND FOR ASCITES  Technique:  Limited ultrasound survey for ascites was performed in all four abdominal quadrants.  Comparison:  Prior abdominal ultrasound 01/10/2013  Findings: Limited four quadrant sonographic evaluation demonstrates sonographically simple ascites in all four quadrants.  The largest fluid pocket is identified in the right lower quadrant at 14 cm in depth. Overall, the degree of ascites is similar to slightly increased compared to prior.  IMPRESSION: Positive for ascites with the largest fluid pocket in the right lower quadrant.   Original Report Authenticated By: Malachy Moan, M.D.     Scheduled Meds: . acetylcysteine  3 mL Nebulization Q6H  . albuterol  2.5 mg Nebulization Q6H  . antiseptic oral rinse  15 mL Mouth Rinse BID  . budesonide-formoterol  2 puff Inhalation BID  . chlorhexidine  15 mL Mouth Rinse BID  . furosemide  40 mg Intravenous BID  . Gerhardt's butt cream   Topical TID  . insulin aspart  0-9 Units Subcutaneous Q4H  . ipratropium  2 spray Each Nare BID  .  levothyroxine  38 mcg Intravenous Daily  . olopatadine  1 drop Both Eyes BID   Continuous Infusions: . TPN (CLINIMIX) +/- additives 70 mL/hr at 01/19/13 1834   And  . fat emulsion 250 mL (01/19/13 1834)        Merrilee Ancona  [facility] Pager [phone]. If 7PM-7AM, please contact night-coverage at [email or web address], password [facility] 01/20/2013, 11:05 AM  LOS: 18 days

## 2013-01-21 DIAGNOSIS — R0609 Other forms of dyspnea: Secondary | ICD-10-CM

## 2013-01-21 DIAGNOSIS — F411 Generalized anxiety disorder: Secondary | ICD-10-CM

## 2013-01-21 DIAGNOSIS — Z515 Encounter for palliative care: Secondary | ICD-10-CM

## 2013-01-21 DIAGNOSIS — R0989 Other specified symptoms and signs involving the circulatory and respiratory systems: Secondary | ICD-10-CM

## 2013-01-21 MED ORDER — ATROPINE ORAL SOLUTION 0.08 MG/ML: 0.0400 mg | ORAL | Status: DC | PRN

## 2013-01-21 MED ORDER — MORPHINE SULFATE 2 MG/ML IJ SOLN
INTRAMUSCULAR | Status: AC
  Administered 2013-01-21: 2 mg via INTRAVENOUS
  Filled 2013-01-21: qty 1

## 2013-01-21 MED ORDER — SODIUM CHLORIDE 0.9 % IV SOLN
1.0000 mg/h | INTRAVENOUS | Status: DC
  Administered 2013-01-21: 1 mg/h via INTRAVENOUS
  Filled 2013-01-21: qty 10

## 2013-01-21 MED ORDER — MORPHINE BOLUS VIA INFUSION
1.0000 mg | INTRAVENOUS | Status: DC | PRN
  Filled 2013-01-21: qty 1

## 2013-01-21 MED ORDER — ACETYLCYSTEINE 20 % IN SOLN
3.0000 mL | Freq: Four times a day (QID) | RESPIRATORY_TRACT | Status: DC | PRN
  Filled 2013-01-21: qty 4

## 2013-01-21 MED ORDER — ATROPINE SULFATE 1 % OP SOLN
2.0000 [drp] | OPHTHALMIC | Status: DC | PRN
  Administered 2013-01-21 (×2): 2 [drp] via SUBLINGUAL
  Filled 2013-01-21: qty 2

## 2013-01-21 MED ORDER — MORPHINE SULFATE 2 MG/ML IJ SOLN
2.0000 mg | Freq: Once | INTRAMUSCULAR | Status: AC
  Administered 2013-01-21: 2 mg via INTRAVENOUS

## 2013-01-21 MED ORDER — MORPHINE SULFATE 2 MG/ML IJ SOLN
1.0000 mg | INTRAMUSCULAR | Status: DC | PRN
  Administered 2013-01-21: 1 mg via INTRAVENOUS
  Filled 2013-01-21: qty 1

## 2013-01-21 MED ORDER — LORAZEPAM 2 MG/ML IJ SOLN: 0.5000 mg | INTRAMUSCULAR | Status: DC | PRN

## 2013-01-21 NOTE — Progress Notes (Signed)
RN paged NP 2/2 pt having agonal respirations with periods of apnea and falling BP and HR. NP to bedside. NP spoke to pt's son at bedside. He verbalizes understanding of the gravity of his mother's condition. He is also realistic to her impending death. I explained the use of medications to help respirations so that pt is comfortable and not "gasping" for air and he agrees at this time to this treatment. He says her allergy to "morphine and related" is those meds have made her "wild" in the past. So, feel OK about using Morphine at this time. Agreed to stop NT suctioning pt and will start Atropine gtts for secretions. Ativan for agitation or restlessness. Will stop CBGs at this time as to not inflict pain. Her sugars have been WNL. Pt has no labs for this am and would recommend stopping those as well should pt live to next ordered lab draw. Son agrees with all of this. Offered Chaplain services and son declined. Son has spoken to his sister about events.  Confirmed pt is a DNR/DNI.  Jimmye Norman, NP [facility]

## 2013-01-21 NOTE — Progress Notes (Signed)
Update: RN paged NP 2/2 pt having periods of apnea and asystole. Spontaneously, reverted to NSR. Conts with agonal breathing. Morphine prn replaced with morphine gtt to keep pt more comfortable 2/2 resp status.  Jimmye Norman, NP

## 2013-01-21 NOTE — Progress Notes (Signed)
PARENTERAL NUTRITION CONSULT NOTE - FOLLOW UP  Pharmacy Consult for TPN Indication: Hx esophageal strictures  Allergies  Allergen Reactions  . Codeine Other (See Comments)    Unknown.  [name] Hyomax (Hyoscyamine Sulfate) Other (See Comments)    unknown  . Other     Negative reactions to narcotics/opiates  . Oxycodone Hcl Er Other (See Comments)    unknown  . Sulfa Antibiotics Other (See Comments)    unknown    Patient Measurements: Height: 5' (152.4 cm) Weight: 155 lb 3.2 oz (70.398 kg) IBW/kg (Calculated) : 45.5  Vital Signs: Temp: 97.2 F (36.2 C) (02/19 0255) Temp src: Oral (02/19 0255) BP: 93/39 mmHg (02/19 0620) Pulse Rate: 94 (02/19 0620) Intake/Output from previous day:   Intake/Output from this shift:    Labs:  Recent Labs  01/19/13 0620 01/20/13 1010  WBC 5.7 2.6*  HGB 7.4* 7.0*  HCT 21.9* 21.0*  PLT 105* 95*     Recent Labs  01/19/13 0620  NA 143  K 3.6  CL 111  CO2 24  GLUCOSE 180*  BUN 61*  CREATININE 0.76  CALCIUM 7.6*  MG 2.0  PHOS 4.0  PROT 4.2*  ALBUMIN 1.2*  AST 20  ALT 15  ALKPHOS 53  BILITOT 0.2*  PREALBUMIN 16.3*  TRIG 107  CHOL 65   Estimated Creatinine Clearance: 44.2 ml/min (by C-G formula based on Cr of 0.76).    Recent Labs  01/20/13 1635 01/20/13 2048 01/21/13 0023  GLUCAP 143* 164* 139*    Insulin Requirements in the past 12 hours:  SSI d/c'd by NP 2/19  Current Nutrition:  Dysphagia 1 diet restarted 2/17 Clinimix E 5/15 at 62ml/hr and Intralipid MWF=averages 1398 kcal and 84g protein weekly with lipids only MWF  Nutritional Goals:  1350-1500 kCal, 75-85 grams of protein per day, >/= 1.5 L  Assessment: 80 y/o female patient admitted on 1/31 with Cdiff colitis. Also noted hx of esophageal strictures. Pt failed her MBS and has essentially been NPO since admission. Determining plans for possible PEG placement & palliative care consult.   GI: Admitted with Cdiff colitis, hx esophageal strictures, starting  some very limited PO intake. Sp MBS 2/17 and dysphagia diet started. 0% meals documented.  Endo: No hx DM, hx hypothyroid (TSH 5.6) on levothyroxine- CBGs/SSI reorderd with inc glucose on BMET 2/17. CBGs 139-164/24h but NP d/c'd SSI.  Lytes: No lytes today.  Renal: Scr 0.76, IV Lasix for diuresis.   Pulm: 3L [place] at 91% - symbicort, albuterol/atrovent, Mucomyst nebs. Agonal respirations with periods of apnea and falling BP and HR overnight.  Cards: Hx HTN, HLD - 93-39, HR 94 on no cardiac meds.   Hepatobil: LFTs WNL, prealbumin up to 16.3.  Neuro: Hx dementia - A&O, palliative care involved. Alert and communicative.   ID: s/p Cdiff treatment this admit  Best Practices: SCDs, MC   TPN Access: CVC Triple Lumen   Plan:  1229 pm: d/c TPN per T/O with Dr. Genelle Gather. Merilynn Finland, PharmD, [facility] Clinical Staff Pharmacist Pager [phone]  01/21/2013, 9:45 AM

## 2013-01-21 NOTE — Progress Notes (Signed)
Responded to room after severe bradycardia warning from CMT. Patient with sinus arrest of > 5 seconds, witnessed by RN, then with HR in 20 - 30 range, sustained x 2 minutes. Demonstrating apnea lasting approximately 45 seconds, witnessed by RN and NT at bedside. O2 sat decreased to 74, then patient began to breathe spontaneously; Cheyne-Stokes respirations noted with rate of 12 per minute. Spontaneous return of sinus rhythm with rate in 90s on telemetry also noted.  BP was 79/20 initially, then 82/40.  Paged Maren Reamer, on-call for [facility] to evaluate need for any further intervention.  Continuing to monitor closely.

## 2013-01-21 NOTE — Progress Notes (Signed)
At approximately 02:50 this RN responded immediately to bedside when patient showed bradycardia with sustained rate from 25 - 30 bpm on telemetry.  Family at bedside told me that his mother had stopped breathing, though he was not sure for how long. Upon checking patient, she was gasping with agonal breaths and appeared in acute distress. Lung sounds were coarse rhonchi throughout lung fields. Vital signs were obtained, and were within defined limits for this patient, regardless of her poor clinical appearance and respiratory status.  Called for respiratory therapy support.  Paged and spoke with Maren Reamer, NP on-call for [facility] regarding the patient's apparent decline. Orders received for Morphine 2mg  IV x 1 now, and she agreed to respond to the floor for further support if needed. She discussed plan of care with the patient's family at length.  Following delivery of IV morphine, patient continued to demonstrate affected breathing pattern.  However, resolution of acute distress was clearly noted.  At the time of this note, further orders for comfort care have been received and initiated. The patient remains in stable condition; continues to demonstrate Cheyne-Stokes breathing with brief periods of apnea. Family is at the bedside and in agreement with plan for comfort measures.  Further family notification has been made of the patient's status. Will continue to provide close family support and comfort measures PRN.

## 2013-01-21 NOTE — Progress Notes (Signed)
Physical Therapy Discharge Patient Details Name: Shirley Bond MRN: [id number] DOB: Mar 24, 1924 Today's Date: 01/21/2013 Time:  -     Patient discharged from PT services secondary to medical decline   Please see latest therapy progress note for current level of functioning and progress toward goals.    Progress and discharge plan discussed with patient and/or caregiver: Patient unable to participate in discharge planning and no caregivers available  GP     Tamala Ser [facility] PT [phone]  01/21/2013, 8:39 AM

## 2013-01-21 NOTE — Progress Notes (Signed)
Pts HR was brady asystole >5 sec then in 20s. Pt was not breathing for >30 seconds. Finally took breath and HR started coming back up to the 80's. Will continue to monitor.

## 2013-01-21 NOTE — Progress Notes (Signed)
Patient Shirley Bond      DOB: 05-Jan-1924      NGE:[id number]  Was able to finally talk with daughter Boyd Kerbs.  They will be over between 1100 am and 1130 am will meet them then to try to help with psychosocial support and symptom management if needed.  Noted events over night daughter understands that her mother is dying.  She brought her father from SNF to be with her last night and is going toget him again this am.  She is struggling with seeing the dying process.  We will attempt to help if patient remains with [place] until then.  Anacleto Batterman L. Ladona Ridgel, MD MBA The Palliative Medicine Team at [facility] Phone: [phone] Pager: [phone]

## 2013-01-21 NOTE — Progress Notes (Signed)
Palliative Medicine Team SW Pt discussed in PMT rounds, referred for psychosocial support to family as pt receiving comfort care at this time. Met with pt's dtr, husband and other relatives at bedside to introduce myself and role for emotional support. Dtr reports appreciation for visit, but states "we're fine, really" and requested opportunity to call me as needs arise. Provided my contact info, will continue to be available for grief support as needed.    Kennieth Francois, LCSWA PMT phone [phone] Pager [phone]

## 2013-01-21 NOTE — Progress Notes (Signed)
[facility] PROGRESS NOTE  VERLYN DANNENBERG ZOX:[id number] DOB: 1924-05-22 DOA: 2013/01/26 PCP: Terald Sleeper, MD  Brief narrative:  79 y/o female who was recenrtly treated for UTI followed by  diarrhea for almost a week with poor po intake.Her mental status declined and she had been becoming progressively more confused and ultimately minimally responsive. In ER she was found to be dehydrated with Cr of 5.3 and WBC of 21.7.  Of note she has a hx of esophageal strictures and gets dilatations done about once a year by Dr Chales Abrahams at [facility]. Her last EGD was in June 2013.[name]  After her admission she was noted to be in septic shock from C.diff. PCCM was consulted on 2/2 and assumed care of the pt until 01/07/2013.    Assessment/Plan:  Septic shock  Due to C diff colitis .  Patient was admitted to ICU and treated with abx and iv fluids, CVC was placed on 2/2. Patient was placed on early goal directed therapy protocol and she required pressors briefly.  Patient became lethargic, bradycardic and had few seconds of asystole overnight. She was started on morphine drip and family notified. Dr Ladona Ridgel from palliative care was requested for symptom management  As she appears to be near death.   Ascites  From severe protein caloric malnutrition and hypoalbuminemia in the setting of iv fluid support.   C diff colitis  Patient was placed on iv flagyl and po vanc on admission .  We did switch vancomycin to enema PR on 2/6 due to patient's inability to take pos.  Patient completed 2 weeks of vancomycin on 01/16/13    Acute kidney injury due to ATN from sepsis  Patient was placed on iv fluids with improvement of renal function  Peak creatinine was 5.44 on 01/03/13 -  Creatinine down to 1 on 01/13/13 Decrease urine out put. No evidence of bladder obstruction on [place]. CT shows ascites and some pleural effusion. Patient has received   IV lasix on 2/9, 2/10, 2/11, 2/12, 2/13, 2/14, 2/15, 2/16 , 2/17 and 2/18     Discontinued IV fluids on 01/12/13  Hypotension  Cortisol checked on 2/2 - and it was adequately elevated - no suspicion for adrenal insufficiency  Resolved    Massive anasarca  After fluid resuscitation in a patient with very low albumin level - 1.2 Patient received iv lasix since 2/9, will continue it as it will help her symptoms .    Hypernatremia  Due to hypovolemia as well as true DH/free water deficit . Resolved with iv fluids.    Hypokalemia and Hypocalcemia and Hypomagnesemia labs on hold as she is actively dying.    Metabolic acidosis  from bicarb loss w/ diarrhea, acute renal failure, and NS resuscitation efforts    Thrombocytopenia  Likely due to acute infection - nadir 25 on 01/09/13 - up to 95 on 01/20/13  - continue to avoid heparin products    - no spontaneous blood loss at this time    Acute encephalopathy  Slowly improving - likely due to toxic metabolic encephalopathy  in setting of severe infection and acute renal failure.   Reported hx of esophageal strictures  GI has seen - no plans for endo in setting of active GI infection - unfortunately due to the results of the modified barium swallow we are unable to give her clear liquids..  Gets esophageal dilatation by Dr Chales Abrahams in [place] once a year.   Anemia  Suspect due to acute illness . No  signs of bleeding    Inability to manage secretions due to extreme frailty     Code Status: DNR after palliative care discussion with family on 2/9  Family Communication: daughter at bedside  Disposition Plan: actively dying.  (insurance company has denied paying for ltach - daughter will appeal) Consultants:  PCCM  [facility] - re-consulted on 01/20/13   Procedures:  2/2 CVL L IJ >>  2/4 TTE - poor acustic windows - no useful information obtained  Antibiotics:  2/1 vanc po and PR until 2/14  2/1 iv flagyl >> 2/15   DVT prophylaxis:  SCDs   HPI/Subjective:  Lethargic    Objective: Filed Vitals:   01/21/13 0118 01/21/13 0255 01/21/13 0520 01/21/13 0620  BP:  106/48 89/39 93/39   Pulse:  101 31 94  Temp:  97.2 F (36.2 C)    TempSrc:  Oral    Resp:  22 12 22   Height:      Weight:      SpO2: 92% 94% 93% 91%   Patient Vitals for the past 24 hrs:  BP Temp Temp src Pulse Resp SpO2  01/21/13 0620 93/39 mmHg - - 94 22 91 %  01/21/13 0520 89/39 mmHg - - 31 12 93 %  01/21/13 0255 106/48 mmHg 97.2 F (36.2 C) Oral 101 22 94 %  01/21/13 0118 - - - - - 92 %  01/20/13 2113 89/46 mmHg 97.4 F (36.3 C) Oral 69 22 96 %  01/20/13 2005 - - - - - 95 %  01/20/13 1400 106/55 mmHg 97.6 F (36.4 C) Axillary 72 24 95 %     Intake/Output Summary (Last 24 hours) at 01/21/13 1223 Last data filed at 01/21/13 0900  Gross per 24 hour  Intake      0 ml  Output      0 ml  Net      0 ml   Filed Weights   01/03/13 0414 01/03/13 0653 01/18/13 0414  Weight: 49.8 kg (109 lb 12.6 oz) 52.2 kg (115 lb 1.3 oz) 70.398 kg (155 lb 3.2 oz)   I/O last 3 completed shifts: In: 0  Out: 1325 [Urine:1325]  Exam:  General: elderly frail female with agonal breathing.  Copious upper airway secretions - unchanged for the last 9 days  Lungs: Bilateral ronchi, no wheezes   Cardiovascular: Regular rate and rhythm without murmur gallop or rub  Abdomen: mildly distended, bowel sounds present, no rebound, no tenderness  Extremities: decreased  bilateral edema -     Data Reviewed: Basic Metabolic Panel:  Recent Labs Lab 01/15/13 0500 01/17/13 0427 01/17/13 2000 01/18/13 0717 01/19/13 0620  NA 139 143  --  144 143  K 3.6 2.8* 3.3* 3.6 3.6  CL 107 108  --  112 111  CO2 25 26  --  27 24  GLUCOSE 153* 145*  --  171* 180*  BUN 50* 58*  --  59* 61*  CREATININE 0.86 0.82  --  0.78 0.76  CALCIUM 7.4* 7.2*  --  7.2* 7.6*  MG 1.7 1.6  --  2.1 2.0  PHOS 3.8 4.1  --   --  4.0   Liver Function Tests:  Recent Labs Lab 01/15/13 0500 01/17/13 0427 01/19/13 0620  AST 24 22 20    ALT 17 16 15   ALKPHOS 58 54 53  BILITOT 0.2* 0.2* 0.2*  PROT 3.9* 3.9* 4.2*  ALBUMIN 1.2* 1.1* 1.2*   No results found for this basename:  LIPASE, AMYLASE,  in the last 168 hours No results found for this basename: AMMONIA,  in the last 168 hours CBC:  Recent Labs Lab 01/19/13 0620 01/20/13 1010  WBC 5.7 2.6*  NEUTROABS 4.7  --   HGB 7.4* 7.0*  HCT 21.9* 21.0*  MCV 88.3 89.0  PLT 105* 95*   Cardiac Enzymes: No results found for this basename: CKTOTAL, CKMB, CKMBINDEX, TROPONINI,  in the last 168 hours BNP (last 3 results) No results found for this basename: PROBNP,  in the last 8760 hours CBG:  Recent Labs Lab 01/20/13 0729 01/20/13 1143 01/20/13 1635 01/20/13 2048 01/21/13 0023  GLUCAP 149* 153* 143* 164* 139*    No results found for this or any previous visit (from the past 240 hour(s)).   Studies: US Abdomen Limited  01/19/2013  *RADIOLOGY REPORT*  Clinical Data: Measure ascites  LIMITED ABDOMEN ULTRASOUND FOR ASCITES  Technique:  Limited ultrasound survey for ascites was performed in all four abdominal quadrants.  Comparison:  Prior abdominal ultrasound 01/10/2013  Findings: Limited four quadrant sonographic evaluation demonstrates sonographically simple ascites in all four quadrants.  The largest fluid pocket is identified in the right lower quadrant at 14 cm in depth. Overall, the degree of ascites is similar to slightly increased compared to prior.  IMPRESSION: Positive for ascites with the largest fluid pocket in the right lower quadrant.   Original Report Authenticated By: Malachy Moan, M.D.    Dg Chest Port 1 View  01/20/2013  *RADIOLOGY REPORT*  Clinical Data: Respiratory distress  PORTABLE CHEST - 1 VIEW  Comparison: 01/10/2013  Findings: Extensive opacification has worsened throughout both lungs.  Left internal jugular vein central venous catheters stable. Cardiac silhouette is obscured.  No definite pneumothorax. Stabilization hardware in the  thoracolumbar spine is again noted. Severe deformity of both shoulder joints is unchanged.  IMPRESSION: Marked worsening of bilateral hemithorax opacification likely a combination of airspace disease and pleural fluid.   Original Report Authenticated By: Jolaine Click, M.D.     Scheduled Meds: . acetylcysteine  3 mL Nebulization Q6H  . albuterol  2.5 mg Nebulization Q6H  . antiseptic oral rinse  15 mL Mouth Rinse BID  . chlorhexidine  15 mL Mouth Rinse BID  . furosemide  40 mg Intravenous Q6H  . Gerhardt's butt cream   Topical TID  . ipratropium  2 spray Each Nare BID  . levothyroxine  38 mcg Intravenous Daily  . olopatadine  1 drop Both Eyes BID   Continuous Infusions: . morphine 1 mg/hr (01/21/13 0620)  . TPN (CLINIMIX) +/- additives 70 mL/hr at 01/20/13 [facility]  [facility] Pager [phone]. If 7PM-7AM, please contact night-coverage at [email or web address], password [facility] 01/21/2013, 12:23 PM  LOS: 19 days

## 2013-01-21 NOTE — Progress Notes (Signed)
Patient ZO:[id number] Shirley Bond      DOB: 1924-02-17      EAV:[id number]   Palliative Medicine Team at [facility] Progress Note    Subjective: Request to return to assist family at end of life.  Boyd Kerbs patient's daughter is struggling with the knowledge that her mom is at the end of life.  She holds herself responsible for switching insurances which she feels may have influenced the care that she received.  We started to work through those thoughts but then refocused on what is happening now.  Boyd Kerbs knows her mom is dying she wants this to be as peaceful as possible.  Her worst fear is seeing her in agony.  At this time the patient is comfortable with mild labored breath.  She does not respond to tactile or verbal stimuli.  She is using some accessory muscles.  Filed Vitals:   01/21/13 0620  BP: 93/39  Pulse: 94  Temp:   Resp: 22   Variable  0 Points 1 Point 2 Points Total  Heart rate per minute  <90 beats 90-109 beats >110 beats 1  Respiratory  Rate per minute < 18 breaths 19-30 breaths  >30 breaths 1  Restlessness; nonpurposeful movements None  occas slight movement Frequent movement 1  Paradoxical breathing pattern: None  Present   Accessory muscle use: rise in clavicle during inspiration None Slight rise Pronounced rise 2  Grunting at end-expiration: guttural sound None  Present 0  Nasal flaring: involuntary movement of nares None  Present 0  Look of fear None  Eyes wide 0  Overall total out of 16    5/16    Respiratory Distress Observation Scale Journal of Palliative Medicine Vol. 13, Number 3, 2010 Campbell et al. Page. 285-290   Physical exam: Generally: not responsive to tactile or verbal stimuli,  Some spontaneous movement of hand and rolls her shoulders as if in pain. Chest decreased with some upper airway crackles,  Not as "wet" as he has been. CVS: distant irregular, S1, S2 Abd: soft not distended no grimace Ext: warm, 2+ edema, no mottling at present Neuro:  unresponsive to tactile or verbal stimuli   Updated daughter on medications started over night .  Her brother had not specifically told her that a morphine drip was started.  I explained how this was keeping her move comfortable and she was ok with this change  Assessment and plan: 79 yr old white female with intractable Cdiff colitis course complicated by UTI, dehydration, esophageal stricture, and septic shock.  The patient has been unable to mount a recovery.  Her Daughter Boyd Kerbs know accepts that her mom is near death .  She is agreeable to emotional support with our social worker, and symptomatic treatment of her mother's condition including discontinuing TPN and lab draws.   1.  DNR/ DNI  2.  Dyspnea: agree with morphine drip.  Will add boluses incase symptoms worsen  3.  Anxiety:  Agree with prn ativan.  Daughter ok with its use.  4.  Discontinue TPN and Labs  5.  Prognosis likely hours   Expecting a hospital death.   Total time:  1140- 1245 pm total time with patient 20 min  Updated Dr. Marni Griffon L. Ladona Ridgel, MD MBA The Palliative Medicine Team at [facility] Phone: [phone] Pager: [phone]

## 2013-01-28 NOTE — Discharge Summary (Signed)
Death Summary  Shirley Bond ZOX:[id number] DOB: 1923/12/15 DOA: 01/20/2013  PCP: Terald Sleeper, MD   Admit date: 20-Jan-2013 Date of Death: 2013-02-15  Final Diagnoses:  Principal Problem:   C. difficile colitis Active Problems:   Cerebral hemorrhage   HTN (hypertension)   Osteoarthritis   COPD (chronic obstructive pulmonary disease)   UTI (urinary tract infection), bacterial   ARF (acute renal failure)   Dehydration   Diarrhea   Hypotension   Rash of groin   Hx of scabies   Encephalopathy acute   Severe sepsis with septic shock   Leukocytosis   Thrombocytopenia, unspecified     History of present illness:   79 y/o female who was recenrtly treated for UTI followed by diarrhea for almost a week with poor po intake.Her mental status declined and she had been becoming progressively more confused and ultimately minimally responsive. In ER she was found to be dehydrated with Cr of 5.3 and WBC of 21.7.  Of note she has a hx of esophageal strictures and gets dilatations done about once a year by Dr Chales Abrahams at [place]. Her last EGD was in June 2013.[name]  After her admission she was noted to be in septic shock from C.diff. PCCM was consulted on 2/2 and assumed care of the pt until 01/07/2013.   Hospital Course:   Septic shock  Due to C diff colitis .  Patient was admitted to ICU and treated with abx and iv fluids, CVC was placed on 2/2. Patient was placed on early goal directed therapy protocol and she required pressors briefly. Patient became lethargic, bradycardic and had few seconds of asystole 2/19. She was started on morphine drip and family notified. Dr Ladona Ridgel from palliative care was requested for symptom management As she appears to be near death.  RN Called NP as pt did not have pulse of respirations, she was pronounced to have passed away at 03 40 by 2 RN and confirmed by NP. Daughter at bedside.   Acute kidney injury due to ATN from sepsis  Patient was placed on iv  fluids with improvement of renal function  Peak creatinine was 5.44 on 01/03/13 -  Creatinine down to 1 on 01/13/13  Decrease urine out put. No evidence of bladder obstruction on [place]. CT shows ascites and some pleural effusion. Patient has received IV lasix on 2/9, 2/10, 2/11, 2/12, 2/13, 2/14, 2/15, 2/16 , 2/17 and 2/18  Discontinued IV fluids on 01/12/13  Hypotension  Cortisol checked on 2/2 - and it was adequately elevated - no suspicion for adrenal insufficiency  Resolved  Massive anasarca  After fluid resuscitation in a patient with very low albumin level - 1.2  Patient received iv lasix since 2/9, will continue it as it will help her symptoms .  Hypernatremia  Due to hypovolemia as well as true DH/free water deficit . Resolved with iv fluids.  Hypokalemia and Hypocalcemia and Hypomagnesemia  Metabolic acidosis  from bicarb loss w/ diarrhea, acute renal failure, and NS resuscitation efforts  Thrombocytopenia  Likely due to acute infection - nadir 25 on 01/09/13 - up to 95 on 01/20/13   Acute encephalopathy   likely due to toxic metabolic encephalopathy in setting of severe infection and acute renal failure.  Reported hx of esophageal strictures   Anemia   Inability to manage secretions due to extreme frailty        Signed:  Viraaj Vorndran  [facility] 2013-02-15, 2:17 PM

## 2013-01-31 NOTE — Progress Notes (Signed)
RN called NP 2/2 pt having no pulse or respirations. Pronounced dead at 0340 by 2 RNs and confirmed by NP when on unit. Daughter at bedside. Son, Clide Cliff, called by this NP. Death certificate completed. Jimmye Norman, NP [facility]

## 2013-01-31 NOTE — Progress Notes (Signed)
Patient's telemetry monitor showing agonal beats, no respirations for about ten minutes. Two RN's confirmed pt in PEA. Pt telemetry showing asystole when NP arrived. NP notified son, daughter at bedside. Chaplain on-call responded. Postmortem care completed on patient. Palliative Care, [facility] bed control notified.  Harless Litten, RN Feb 15, 2013

## 2013-01-31 NOTE — Progress Notes (Signed)
Chaplain Note: Immediately following pt's death, chaplain visited with pt's daughter.  Chaplain provided spiritual comfort, support, and prayer, assisting pt's daughter in processing initial feelings of grief.  Pt's daughter expressed appreciation for chaplain support.  01/28/13 0600  Clinical Encounter Type  Visited With Patient and family together  Visit Type Spiritual support;Death  Referral From Nurse  Spiritual Encounters  Spiritual Needs Emotional;Prayer;Sacred text;Grief support  Stress Factors  Family Stress Factors Loss;Major life changes;Lack of caregivers;Exhausted  Verdie Shire, Chaplain [phone]

## 2013-01-31 NOTE — Progress Notes (Signed)
1/2 bag of IV morphine wasted in sink by 2 RN's.  Harless Litten, RN Feb 08, 2013

## 2013-01-31 NOTE — Care Management Note (Signed)
Page 1 of 2   01/08/2013     9:03:52 AM   CARE MANAGEMENT NOTE 01/17/2013  Patient:  Shirley Bond, Shirley Bond   Account Number:  [id number]  Date Initiated:  01/06/2013  Documentation initiated by:  Donn Pierini  Subjective/Objective Assessment:   PT admitted with septic shock AMS     Action/Plan:   PTA pt was at [facility]   Anticipated DC Date:  01/17/2013   Anticipated DC Plan:  EXPIRED  In-house referral  Clinical Social Worker      DC Planning Services  CM consult      Choice offered to / List presented to:             Status of service:  Completed, signed off Medicare Important Message given?   (If response is "NO", the following Medicare IM given date fields will be blank) Date Medicare IM given:   Date Additional Medicare IM given:    Discharge Disposition:  EXPIRED  Per UR Regulation:  Reviewed for med. necessity/level of care/duration of stay  If discussed at Long Length of Stay Meetings, dates discussed:   01/13/2013  01/20/2013    Comments:  01/26/2013- 0800- Donn Pierini RN, BSN [phone] Pt expired early this am  01/21/13- 0900- Donn Pierini RN, BSN [phone] Letter of denial for [facility] has arrived- copy of letter brought to this CM- unfortunately pt has taken a turn for the worse during night and is actively dying this am with periods of pauses and apnea. Daughter will be mailed a copy of the denial letter per insurance- in light of current events will not approach daughter unless daughter makes a request regarding LTACH or the letter. Will support daughter and family as needed during the dying process.  01/20/13- 1200- Donn Pierini RN, BSN [phone] Spoke with daughter at bedside- who is wondering if we have heard anything from insurance regarding LTACH approval- at this time per [employer] they have not received any letter at this time- per [facility] with [facility]- it is still in process with Wellsite geologist. Explained process again to daughter who is anxious -  and assured her that we would let her know if we heard anything-  at around 1400- heard from insurance that a letter of denial had been issued and sent to [employer]- will await letter and speak with daughter after [employer] has issued the letter.  01-19-13 Tomi Bamberger, RN,BSN [phone] 01-19-13 Pt still with decreased Po intake with TPN. Awaiting LTAC consult and insurance approval vs denial. SNF bed offer on table at [facility].  01/16/13- 1700- Donn Pierini RN, BSN [phone] Spoke with pt's daughter Boyd Kerbs at bedside regarding LTACH referral- referral process has been started and per both [employer] and pt's insurance provider this process goes to the Wellsite geologist of the insurance provider for approval. Explained to daughter that we need to look at back up plans for SNF  in case we get a denial for [facility]- we will follow up on Monday morning regarding LTACH option.  01/15/13- 1400- Donn Pierini RN,BSN [phone] Referral received for [facility]- per CSW Reece Levy- pt's daughter would like [facility]) call made to [employer] with [employer] and message left for referral. will f/u on referral on 01/16/13  01/14/13- 1215- Donn Pierini RN, BSN [phone] Pt on cont. TPN, urine outpt still low.  plan remains to return to SNF- ?on TPN  01/06/13- 1130- Donn Pierini RN BSN [phone] Pt from SNF- CSW following for return to SNF when medically stable.  Cont. IV abx, IVF, NGT

## 2013-01-31 DEATH — deceased

## 2013-07-15 DIAGNOSIS — Z029 Encounter for administrative examinations, unspecified: Secondary | ICD-10-CM

## 2014-02-10 IMAGING — CR DG CHEST 2V
2 series · 2 of 2 positions shown · non-contrast
Comparison: Chest radiograph performed 04/11/2007

CLINICAL DATA: Dizziness and shortness of breath.

CHEST - 2 VIEW

[w chest lat]
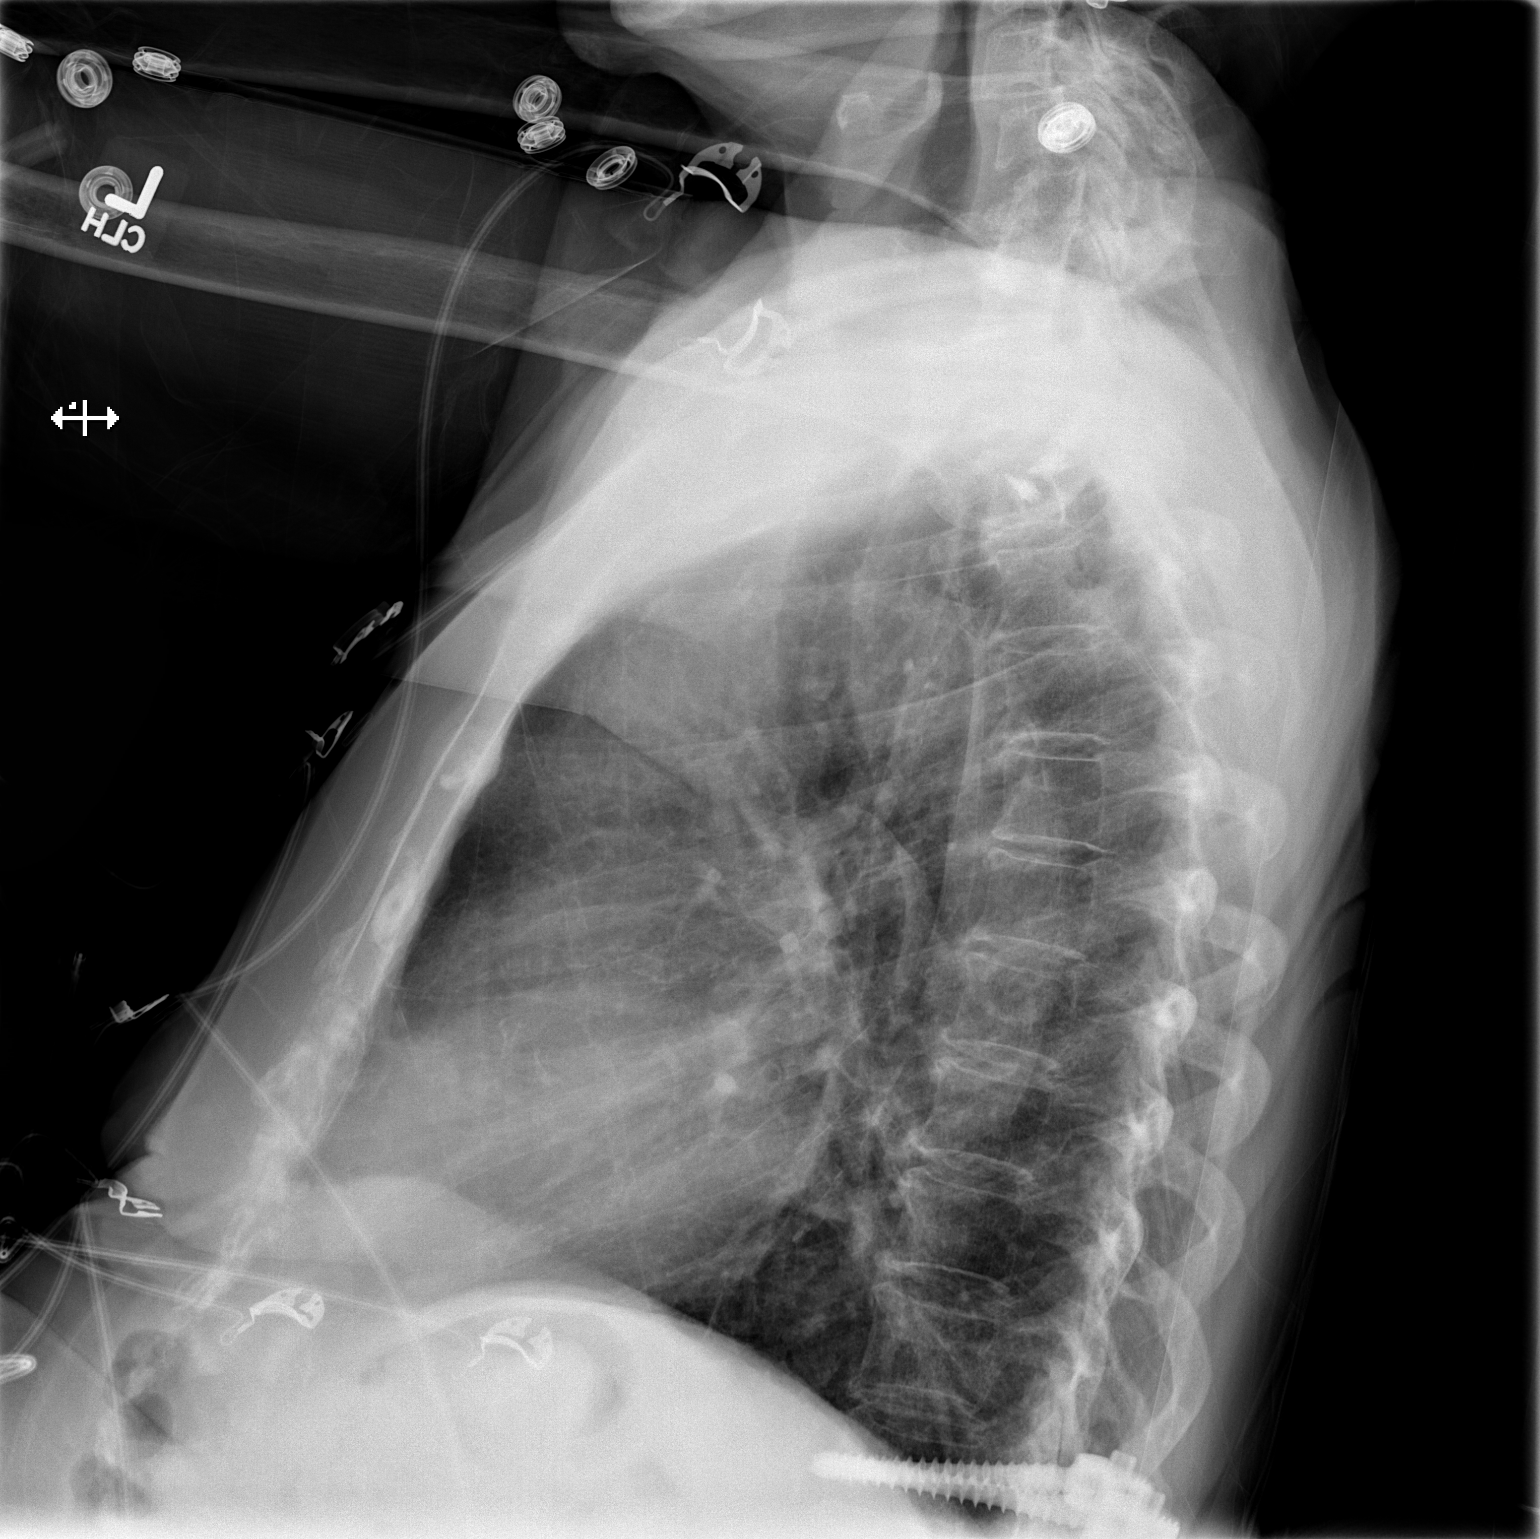

[x chest ap]
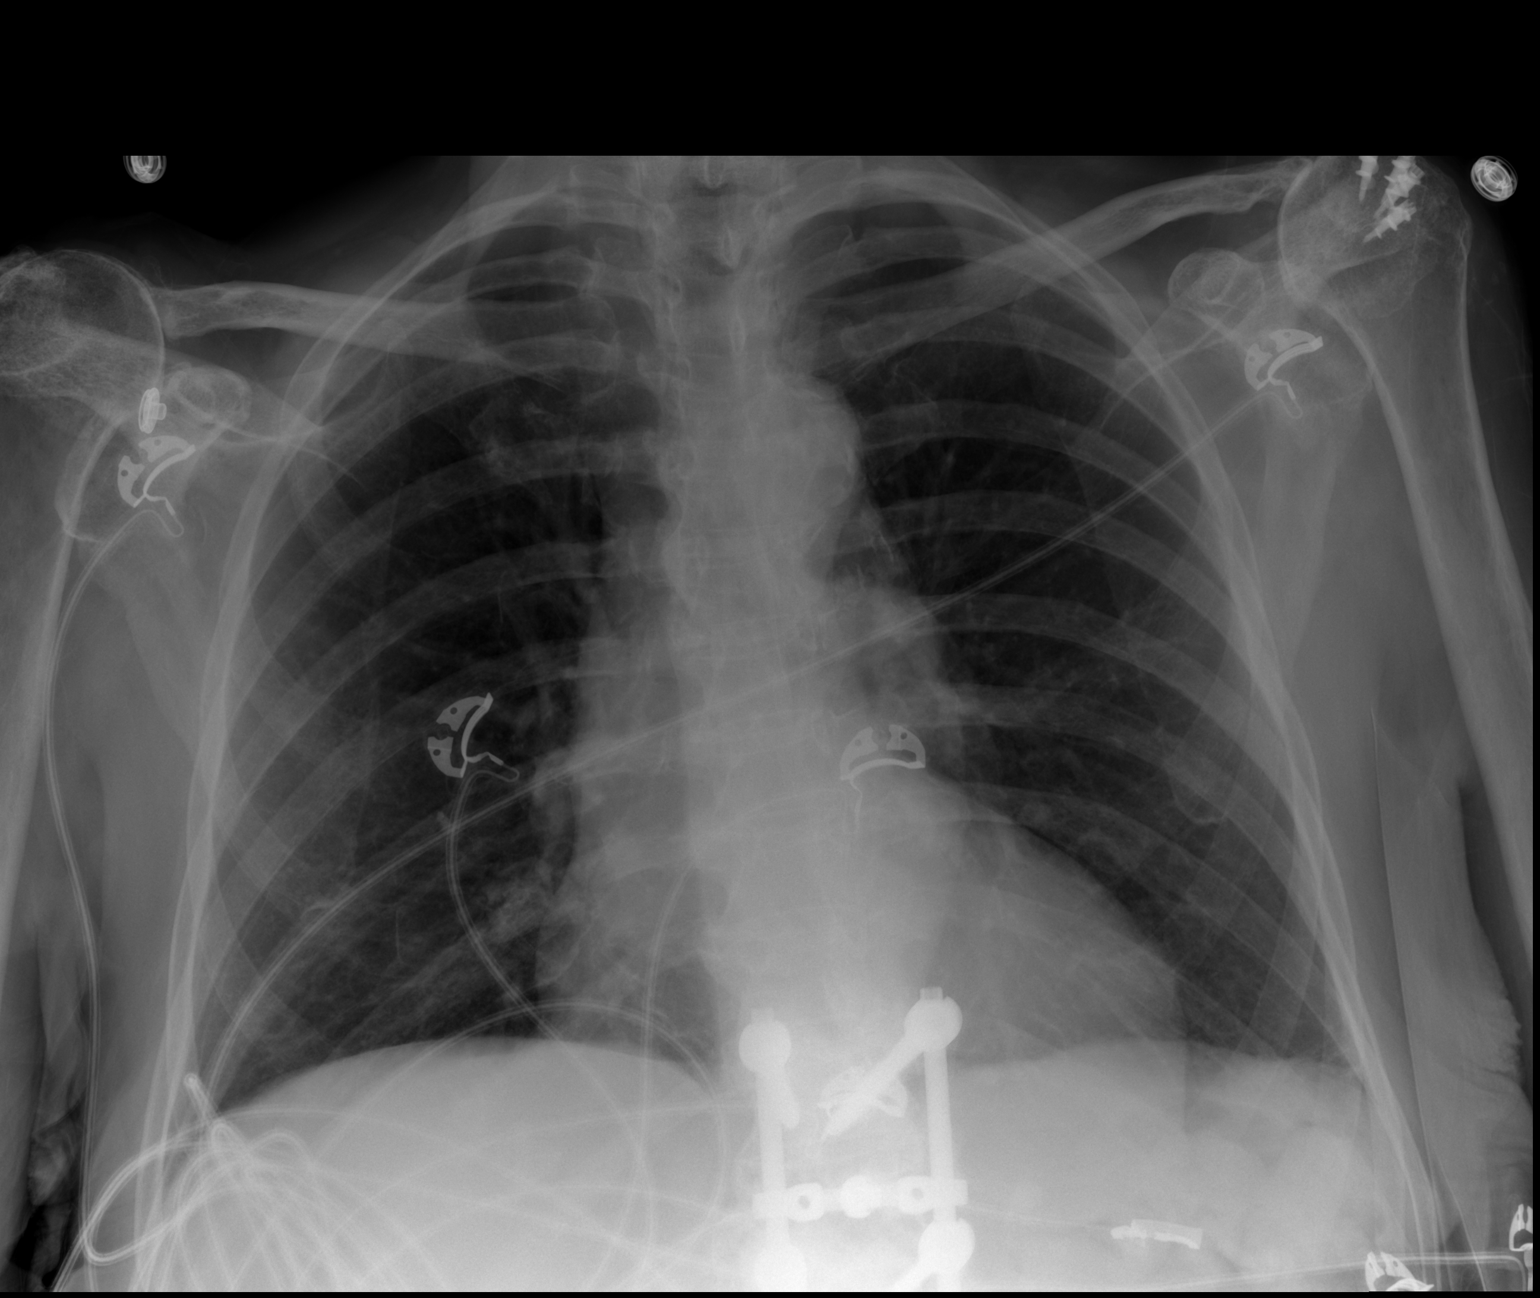

[2 of 2 positions shown; findings below may reference images not displayed]

FINDINGS: The lungs are well-aerated and clear.  There is no
evidence of focal opacification, pleural effusion or pneumothorax.

The heart is borderline normal in size; the mediastinal contour is
within normal limits.  No acute osseous abnormalities are seen.
There is chronic superior subluxation of both humeral heads, with
associated bony resorption of the distal clavicles.  Thoracolumbar
spinal fusion hardware is partially imaged.
IMPRESSION: No acute cardiopulmonary process seen.

## 2015-04-22 DIAGNOSIS — Z029 Encounter for administrative examinations, unspecified: Secondary | ICD-10-CM
